# Patient Record
Sex: Male | Born: 1955 | Race: White | Hispanic: No | Marital: Single | Smoking: Current some day smoker
Health system: Southern US, Community
[De-identification: ages and names within clinical notes are randomized; demographics above are authoritative.]

## PROBLEM LIST (undated history)

## (undated) DIAGNOSIS — J189 Pneumonia, unspecified organism: Secondary | ICD-10-CM

## (undated) DIAGNOSIS — M1990 Unspecified osteoarthritis, unspecified site: Secondary | ICD-10-CM

## (undated) DIAGNOSIS — K4020 Bilateral inguinal hernia, without obstruction or gangrene, not specified as recurrent: Secondary | ICD-10-CM

## (undated) DIAGNOSIS — K7030 Alcoholic cirrhosis of liver without ascites: Secondary | ICD-10-CM

## (undated) DIAGNOSIS — E119 Type 2 diabetes mellitus without complications: Secondary | ICD-10-CM

## (undated) DIAGNOSIS — R7303 Prediabetes: Secondary | ICD-10-CM

## (undated) DIAGNOSIS — C439 Malignant melanoma of skin, unspecified: Secondary | ICD-10-CM

## (undated) DIAGNOSIS — I10 Essential (primary) hypertension: Secondary | ICD-10-CM

## (undated) DIAGNOSIS — F419 Anxiety disorder, unspecified: Secondary | ICD-10-CM

## (undated) DIAGNOSIS — B1920 Unspecified viral hepatitis C without hepatic coma: Secondary | ICD-10-CM

## (undated) DIAGNOSIS — C229 Malignant neoplasm of liver, not specified as primary or secondary: Secondary | ICD-10-CM

## (undated) DIAGNOSIS — R21 Rash and other nonspecific skin eruption: Secondary | ICD-10-CM

## (undated) DIAGNOSIS — K649 Unspecified hemorrhoids: Secondary | ICD-10-CM

## (undated) DIAGNOSIS — K635 Polyp of colon: Secondary | ICD-10-CM

## (undated) HISTORY — DX: Polyp of colon: K63.5

## (undated) HISTORY — DX: Malignant neoplasm of liver, not specified as primary or secondary: C22.9

## (undated) HISTORY — DX: Alcoholic cirrhosis of liver without ascites: K70.30

## (undated) HISTORY — DX: Anxiety disorder, unspecified: F41.9

## (undated) HISTORY — DX: Unspecified osteoarthritis, unspecified site: M19.90

## (undated) HISTORY — DX: Unspecified viral hepatitis C without hepatic coma: B19.20

## (undated) HISTORY — DX: Bilateral inguinal hernia, without obstruction or gangrene, not specified as recurrent: K40.20

## (undated) HISTORY — PX: NO PAST SURGERIES: SHX2092

## (undated) HISTORY — DX: Unspecified hemorrhoids: K64.9

## (undated) HISTORY — DX: Malignant melanoma of skin, unspecified: C43.9

---

## 2010-10-21 ENCOUNTER — Emergency Department (HOSPITAL_COMMUNITY)
Admission: EM | Admit: 2010-10-21 | Discharge: 2010-10-21 | Disposition: A | Discharge status: Home / Self Care | Payer: Self-pay | Attending: Emergency Medicine | Admitting: Emergency Medicine

## 2010-10-21 DIAGNOSIS — B029 Zoster without complications: Secondary | ICD-10-CM | POA: Insufficient documentation

## 2010-10-21 DIAGNOSIS — I10 Essential (primary) hypertension: Secondary | ICD-10-CM | POA: Insufficient documentation

## 2010-10-21 DIAGNOSIS — R21 Rash and other nonspecific skin eruption: Secondary | ICD-10-CM | POA: Insufficient documentation

## 2011-10-23 DIAGNOSIS — J189 Pneumonia, unspecified organism: Secondary | ICD-10-CM

## 2011-10-23 HISTORY — DX: Pneumonia, unspecified organism: J18.9

## 2011-10-25 ENCOUNTER — Emergency Department (HOSPITAL_COMMUNITY): Payer: Self-pay

## 2011-10-25 ENCOUNTER — Encounter (HOSPITAL_COMMUNITY): Payer: Self-pay | Admitting: Emergency Medicine

## 2011-10-25 ENCOUNTER — Emergency Department (HOSPITAL_COMMUNITY)
Admission: EM | Admit: 2011-10-25 | Discharge: 2011-10-25 | Disposition: A | Discharge status: Home / Self Care | Payer: Self-pay | Attending: Emergency Medicine | Admitting: Emergency Medicine

## 2011-10-25 DIAGNOSIS — J189 Pneumonia, unspecified organism: Secondary | ICD-10-CM | POA: Insufficient documentation

## 2011-10-25 DIAGNOSIS — R059 Cough, unspecified: Secondary | ICD-10-CM | POA: Insufficient documentation

## 2011-10-25 DIAGNOSIS — R05 Cough: Secondary | ICD-10-CM | POA: Insufficient documentation

## 2011-10-25 DIAGNOSIS — R61 Generalized hyperhidrosis: Secondary | ICD-10-CM | POA: Insufficient documentation

## 2011-10-25 DIAGNOSIS — R5381 Other malaise: Secondary | ICD-10-CM | POA: Insufficient documentation

## 2011-10-25 DIAGNOSIS — R109 Unspecified abdominal pain: Secondary | ICD-10-CM | POA: Insufficient documentation

## 2011-10-25 DIAGNOSIS — J3489 Other specified disorders of nose and nasal sinuses: Secondary | ICD-10-CM | POA: Insufficient documentation

## 2011-10-25 DIAGNOSIS — I10 Essential (primary) hypertension: Secondary | ICD-10-CM | POA: Insufficient documentation

## 2011-10-25 DIAGNOSIS — Z79899 Other long term (current) drug therapy: Secondary | ICD-10-CM | POA: Insufficient documentation

## 2011-10-25 DIAGNOSIS — R6883 Chills (without fever): Secondary | ICD-10-CM | POA: Insufficient documentation

## 2011-10-25 HISTORY — DX: Essential (primary) hypertension: I10

## 2011-10-25 LAB — CBC
HCT: 36.4 % — ABNORMAL LOW (ref 39.0–52.0)
MCHC: 36.5 g/dL — ABNORMAL HIGH (ref 30.0–36.0)
RDW: 12 % (ref 11.5–15.5)

## 2011-10-25 LAB — URINALYSIS, ROUTINE W REFLEX MICROSCOPIC
Glucose, UA: NEGATIVE mg/dL
Hgb urine dipstick: NEGATIVE
Specific Gravity, Urine: 1.026 (ref 1.005–1.030)

## 2011-10-25 LAB — COMPREHENSIVE METABOLIC PANEL
AST: 62 U/L — ABNORMAL HIGH (ref 0–37)
Albumin: 2.4 g/dL — ABNORMAL LOW (ref 3.5–5.2)
Calcium: 8.9 mg/dL (ref 8.4–10.5)
Creatinine, Ser: 0.85 mg/dL (ref 0.50–1.35)
Total Protein: 7.2 g/dL (ref 6.0–8.3)

## 2011-10-25 LAB — DIFFERENTIAL
Basophils Absolute: 0 10*3/uL (ref 0.0–0.1)
Basophils Relative: 0 % (ref 0–1)
Eosinophils Relative: 0 % (ref 0–5)
Monocytes Absolute: 0.8 10*3/uL (ref 0.1–1.0)
Neutro Abs: 12.2 10*3/uL — ABNORMAL HIGH (ref 1.7–7.7)

## 2011-10-25 LAB — URINE MICROSCOPIC-ADD ON

## 2011-10-25 IMAGING — CR DG CHEST 2V
2 series · 2 of 2 positions shown · non-contrast
Comparison: [DATE]

CLINICAL DATA: Cough, chest pain

CHEST - 2 VIEW

[w chest pa]
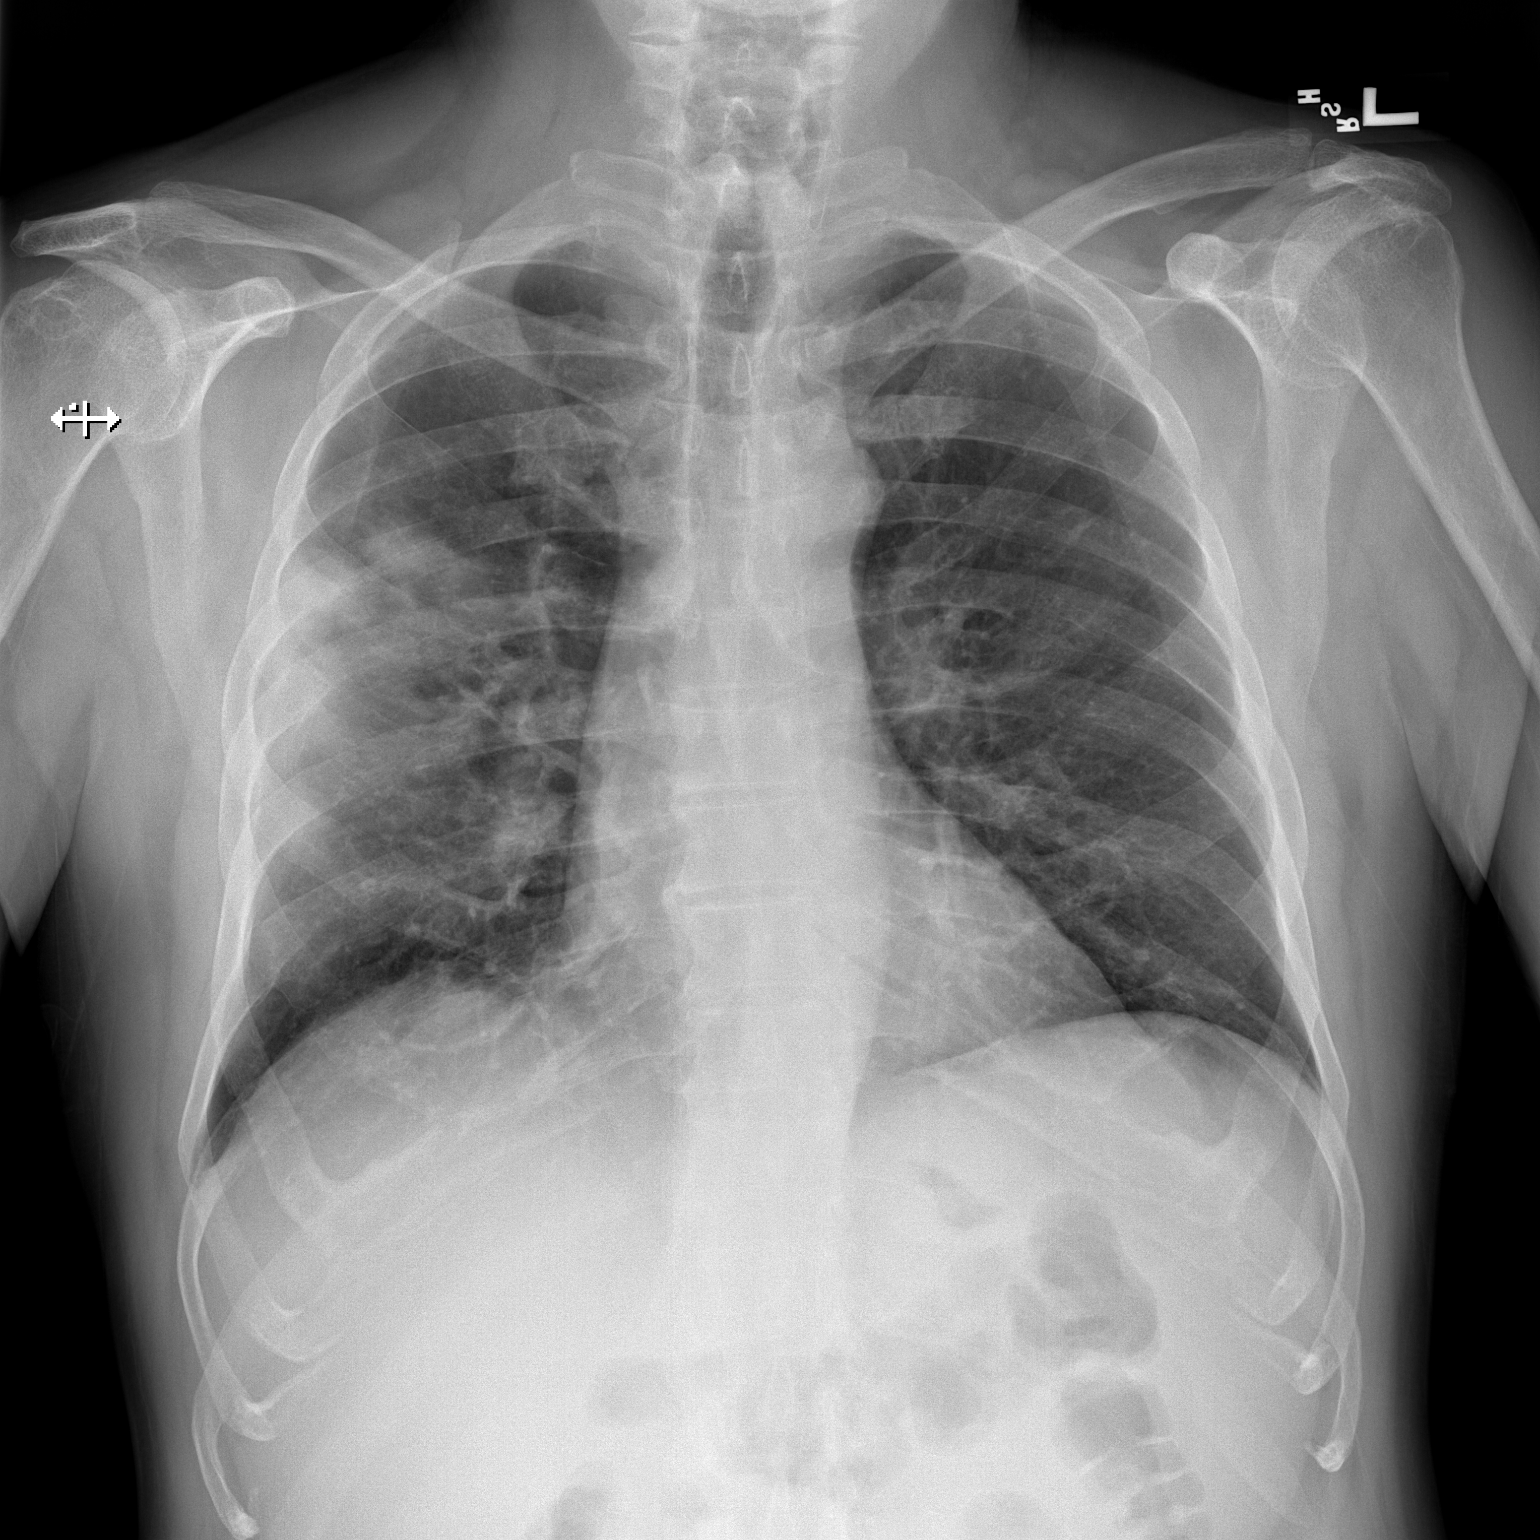

[w chest lat]
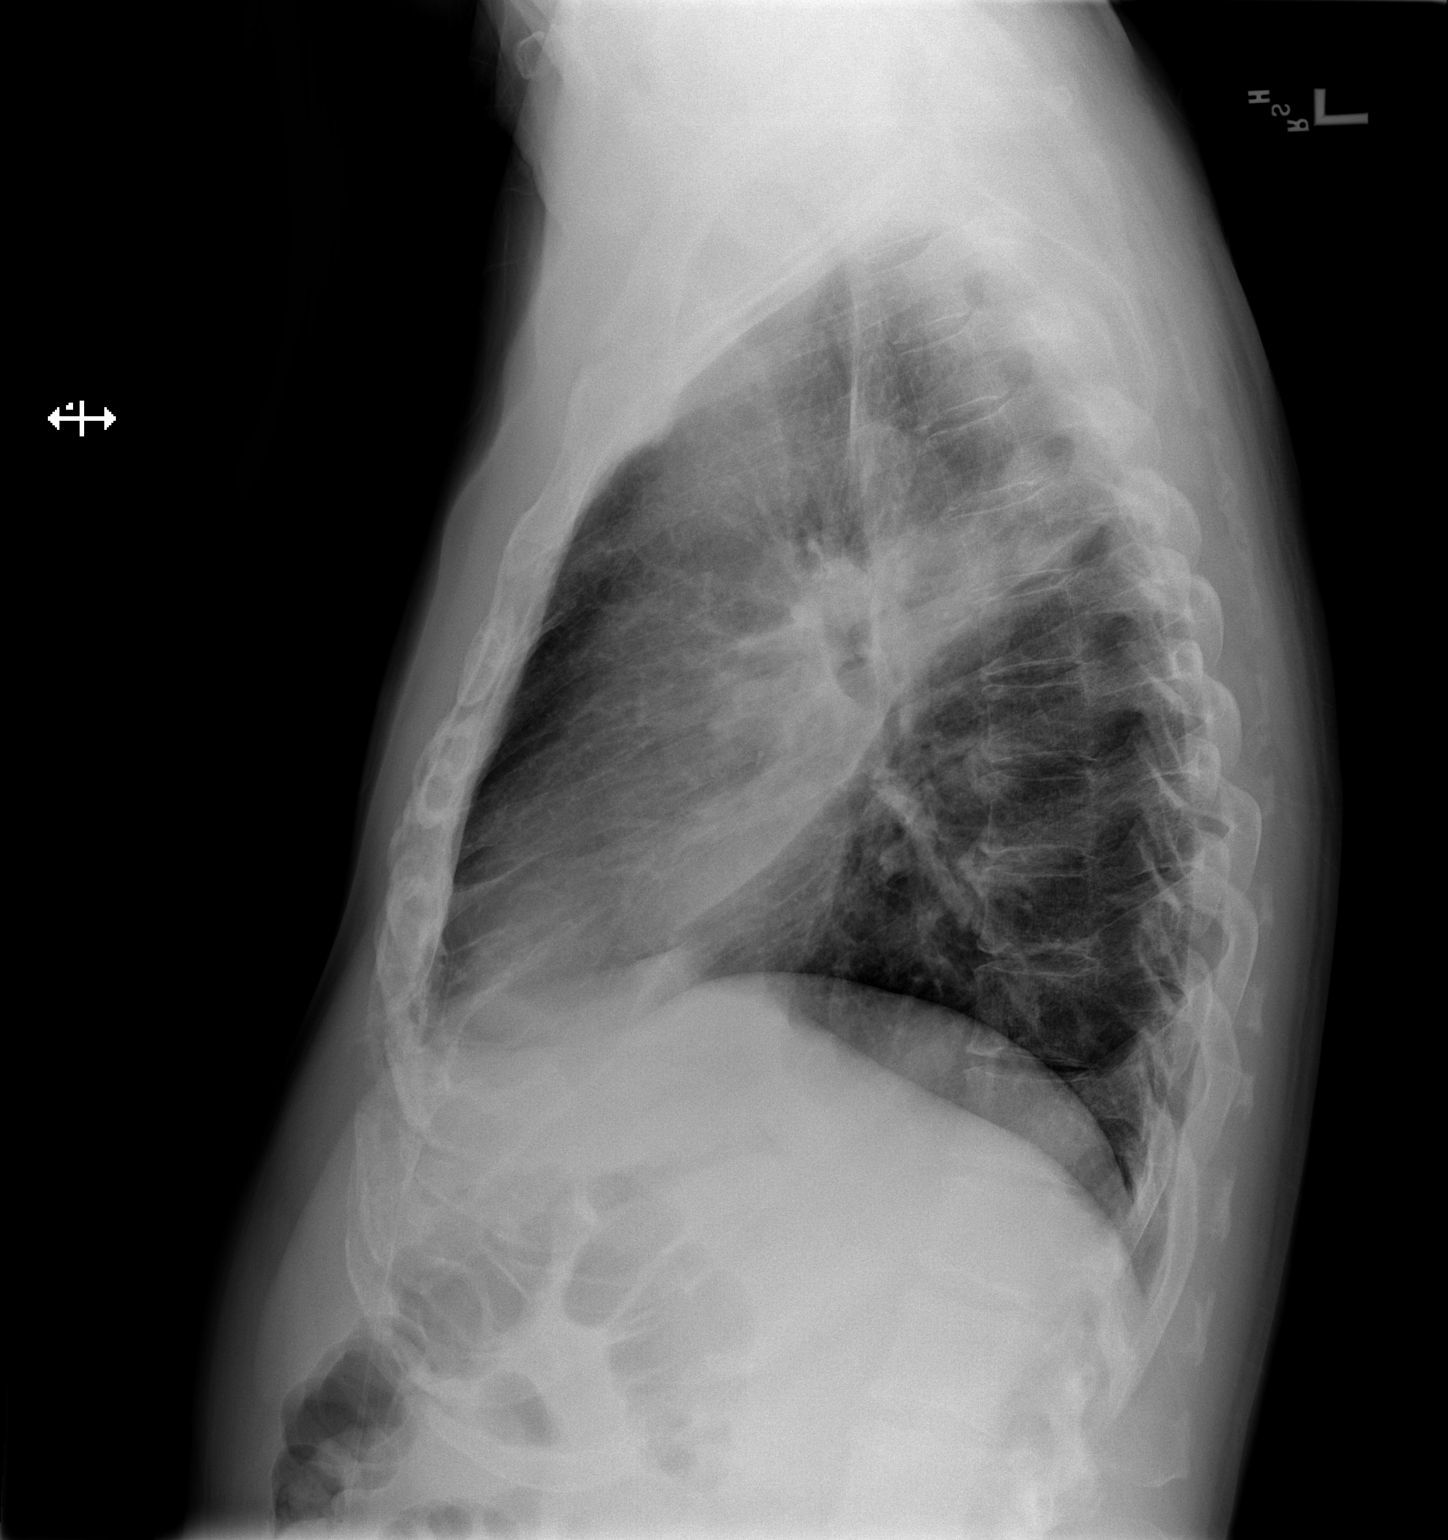

[2 of 2 positions shown; findings below may reference images not displayed]

FINDINGS: Cardiomediastinal silhouette is stable.  There is patchy
airspace disease in the right upper lobe perihilar highly
suspicious for infiltrate/pneumonia.  Follow-up to resolution after
appropriate treatment is recommended.  Bony thorax is stable.
IMPRESSION: Patchy airspace disease right upper lobe perihilar highly
suspicious for infiltrate/pneumonia.  Follow-up to resolution after
appropriate treatment is recommended.

## 2011-10-25 MED ORDER — DEXTROSE 5 % IV SOLN
500.0000 mg | Freq: Once | INTRAVENOUS | Status: AC
  Administered 2011-10-25: 500 mg via INTRAVENOUS
  Filled 2011-10-25: qty 500

## 2011-10-25 MED ORDER — AZITHROMYCIN 250 MG PO TABS: 250.0000 mg | ORAL_TABLET | Freq: Every day | ORAL | Status: DC

## 2011-10-25 MED ORDER — ALBUTEROL SULFATE HFA 108 (90 BASE) MCG/ACT IN AERS
2.0000 | INHALATION_SPRAY | RESPIRATORY_TRACT | Status: DC | PRN
  Administered 2011-10-25: 2 via RESPIRATORY_TRACT
  Filled 2011-10-25: qty 6.7

## 2011-10-25 MED ORDER — SODIUM CHLORIDE 0.9 % IV BOLUS (SEPSIS)
1000.0000 mL | Freq: Once | INTRAVENOUS | Status: AC
  Administered 2011-10-25: 1000 mL via INTRAVENOUS

## 2011-10-25 NOTE — ED Provider Notes (Signed)
History   This chart was scribed for Nelia Shi, MD by Brooks Sailors. The patient was seen in room STRE4/STRE4. Patient's care was started at 1102.   CSN: [id number]  Arrival date & time 10/25/11  1102   First MD Initiated Contact with Patient 10/25/11 1140      Chief Complaint  Patient presents with  . Fatigue  . Influenza    (Consider location/radiation/quality/duration/timing/severity/associated sxs/prior treatment) HPI  Levi Conner is a 56 y.o. male who presents to the Emergency Department complaining of fatigue and low blood pressure onset about three weeks ago. Patient says has been fighting allergies and virus-like symptoms for three weeks with associated cough, congestion, abdominal soreness, night sweats and chills. Patient denies history of prostate problems but says he occasionally has to urinate at night and has had decreased sleep. Patient has been taking Musonex at home with minimal relief. Denies vomiting and diarrhea.    Past Medical History  Diagnosis Date  . Hypertension     History reviewed. No pertinent past surgical history.  History reviewed. No pertinent family history.  History  Substance Use Topics  . Smoking status: Current Everyday Smoker  . Smokeless tobacco: Not on file  . Alcohol Use: No      Review of Systems  HENT: Positive for congestion.   Respiratory: Positive for cough.   Gastrointestinal: Negative for vomiting and diarrhea.  All other systems reviewed and are negative.    Allergies  Penicillins and Sulfa antibiotics  Home Medications   Current Outpatient Rx  Name Route Sig Dispense Refill  . ACETAMINOPHEN 500 MG PO TABS Oral Take 1,000 mg by mouth every 6 (six) hours as needed. For pain    . OMEGA-3 FATTY ACIDS 1000 MG PO CAPS Oral Take 1 g by mouth daily.    . ADULT MULTIVITAMIN W/MINERALS CH Oral Take 1 tablet by mouth daily.      BP 115/73  Pulse 82  Temp(Src) 98.6 F (37 C) (Oral)  Resp 19  SpO2  98%  Physical Exam  Nursing note and vitals reviewed. Constitutional: He is oriented to person, place, and time. He appears well-developed and well-nourished. No distress.  HENT:  Head: Normocephalic and atraumatic.  Eyes: EOM are normal.  Neck: Neck supple. No tracheal deviation present.  Cardiovascular: Normal rate.   Pulmonary/Chest: Effort normal. No respiratory distress.       Scattered rhonchi bilaterally.   Musculoskeletal: Normal range of motion.  Neurological: He is alert and oriented to person, place, and time.  Skin: Skin is warm and dry.  Psychiatric: He has a normal mood and affect. His behavior is normal.    ED Course  Procedures (including critical care time) DIAGNOSTIC STUDIES: Oxygen Saturation is 95% on room air, adequate by my interpretation.    COORDINATION OF CARE: 12:02PM Patient informed of current plan for treatment and evaluation and agrees with plan at this time.     Labs Reviewed  CBC - Abnormal; Notable for the following:    WBC 15.1 (*)    RBC 3.81 (*)    HCT 36.4 (*)    MCH 34.9 (*)    MCHC 36.5 (*)    All other components within normal limits  DIFFERENTIAL - Abnormal; Notable for the following:    Neutrophils Relative 81 (*)    Neutro Abs 12.2 (*)    All other components within normal limits  COMPREHENSIVE METABOLIC PANEL - Abnormal; Notable for the following:    Sodium  126 (*)    Chloride 92 (*)    Glucose, Bld 151 (*)    Albumin 2.4 (*)    AST 62 (*) HEMOLYSIS AT THIS LEVEL MAY AFFECT RESULT   Alkaline Phosphatase 148 (*)    All other components within normal limits  URINALYSIS, ROUTINE W REFLEX MICROSCOPIC - Abnormal; Notable for the following:    Color, Urine AMBER (*) BIOCHEMICALS MAY BE AFFECTED BY COLOR   APPearance CLOUDY (*)    Bilirubin Urine SMALL (*)    Ketones, ur 15 (*)    Leukocytes, UA TRACE (*)    All other components within normal limits  URINE MICROSCOPIC-ADD ON   Dg Chest 2 View  10/25/2011  *RADIOLOGY REPORT*   Clinical Data: Cough, chest pain  CHEST - 2 VIEW  Comparison: 10/17/2004  Findings: Cardiomediastinal silhouette is stable.  There is patchy airspace disease in the right upper lobe perihilar highly suspicious for infiltrate/pneumonia.  Follow-up to resolution after appropriate treatment is recommended.  Bony thorax is stable.  IMPRESSION: Patchy airspace disease right upper lobe perihilar highly suspicious for infiltrate/pneumonia.  Follow-up to resolution after appropriate treatment is recommended.  Original Report Authenticated By: Natasha Mead, M.D.      1. CAP  MDM  Patient stable, VSS, nontoxic with evidence of PNA on CXR. Feel outpatient treatment appropriate.      I personally performed the services described in this documentation, which was scribed in my presence. The recorded information has been reviewed and considered.     Nelia Shi, MD 10/25/11 [phone]

## 2011-10-25 NOTE — ED Notes (Signed)
Pt c/o fatigue and chills with body aches worse at night for 3 weeks; pt sts some allergies to pollen also

## 2011-10-25 NOTE — Discharge Instructions (Signed)
FOLLOW UP WITH YOUR DOCTOR FOR RECHECK NEXT WEEK. TAKE MEDICATIONS AS PRESCRIBED. RETURN HERE WITH ANY HIGH FEVER, SEVERE PAIN OR NEW CONCERN.  Pneumonia, Adult Pneumonia is an infection of the lungs.  CAUSES Pneumonia may be caused by bacteria or a virus. Usually, these infections are caused by breathing infectious particles into the lungs (respiratory tract). SYMPTOMS   Cough.   Fever.   Chest pain.   Increased rate of breathing.   Wheezing.   Mucus production.  DIAGNOSIS  If you have the common symptoms of pneumonia, your caregiver will typically confirm the diagnosis with a chest X-ray. The X-ray will show an abnormality in the lung (pulmonary infiltrate) if you have pneumonia. Other tests of your blood, urine, or sputum may be done to find the specific cause of your pneumonia. Your caregiver may also do tests (blood gases or pulse oximetry) to see how well your lungs are working. TREATMENT  Some forms of pneumonia may be spread to other people when you cough or sneeze. You may be asked to wear a mask before and during your exam. Pneumonia that is caused by bacteria is treated with antibiotic medicine. Pneumonia that is caused by the influenza virus may be treated with an antiviral medicine. Most other viral infections must run their course. These infections will not respond to antibiotics.  PREVENTION A pneumococcal shot (vaccine) is available to prevent a common bacterial cause of pneumonia. This is usually suggested for:  People over 13 years old.   Patients on chemotherapy.   People with chronic lung problems, such as bronchitis or emphysema.   People with immune system problems.  If you are over 65 or have a high risk condition, you may receive the pneumococcal vaccine if you have not received it before. In some countries, a routine influenza vaccine is also recommended. This vaccine can help prevent some cases of pneumonia.You may be offered the influenza vaccine as part  of your care. If you smoke, it is time to quit. You may receive instructions on how to stop smoking. Your caregiver can provide medicines and counseling to help you quit. HOME CARE INSTRUCTIONS   Cough suppressants may be used if you are losing too much rest. However, coughing protects you by clearing your lungs. You should avoid using cough suppressants if you can.   Your caregiver may have prescribed medicine if he or she thinks your pneumonia is caused by a bacteria or influenza. Finish your medicine even if you start to feel better.   Your caregiver may also prescribe an expectorant. This loosens the mucus to be coughed up.   Only take over-the-counter or prescription medicines for pain, discomfort, or fever as directed by your caregiver.   Do not smoke. Smoking is a common cause of bronchitis and can contribute to pneumonia. If you are a smoker and continue to smoke, your cough may last several weeks after your pneumonia has cleared.   A cold steam vaporizer or humidifier in your room or home may help loosen mucus.   Coughing is often worse at night. Sleeping in a semi-upright position in a recliner or using a couple pillows under your head will help with this.   Get rest as you feel it is needed. Your body will usually let you know when you need to rest.  SEEK IMMEDIATE MEDICAL CARE IF:   Your illness becomes worse. This is especially true if you are elderly or weakened from any other disease.   You cannot control  your cough with suppressants and are losing sleep.   You begin coughing up blood.   You develop pain which is getting worse or is uncontrolled with medicines.   You have a fever.   Any of the symptoms which initially brought you in for treatment are getting worse rather than better.   You develop shortness of breath or chest pain.  MAKE SURE YOU:   Understand these instructions.   Will watch your condition.   Will get help right away if you are not doing well  or get worse.  Document Released: 06/10/2005 Document Revised: 05/30/2011 Document Reviewed: 08/30/2010 [facility] Patient Information 2012 [place], [place].

## 2011-10-25 NOTE — ED Notes (Signed)
Pt c/o lt lower rib pain for 5-6 weeks .  He has been coughing for the same amount of time.  He has also had an intermittent temp

## 2011-10-25 NOTE — ED Notes (Signed)
Ordered regular diet tray

## 2011-10-25 NOTE — ED Notes (Signed)
Antibiotic infusing as specified.  The pt had a meal tray ordered pt did not eat very much.  No complaints.

## 2011-10-30 ENCOUNTER — Encounter (HOSPITAL_COMMUNITY): Payer: Self-pay | Admitting: *Deleted

## 2011-10-30 ENCOUNTER — Emergency Department (HOSPITAL_COMMUNITY): Payer: Self-pay

## 2011-10-30 ENCOUNTER — Inpatient Hospital Stay (HOSPITAL_COMMUNITY)
Admission: EM | Admit: 2011-10-30 | Discharge: 2011-10-31 | DRG: 179 | Disposition: A | Discharge status: Home / Self Care | Payer: Self-pay | Attending: Internal Medicine | Admitting: Internal Medicine

## 2011-10-30 DIAGNOSIS — Z882 Allergy status to sulfonamides status: Secondary | ICD-10-CM

## 2011-10-30 DIAGNOSIS — J852 Abscess of lung without pneumonia: Principal | ICD-10-CM | POA: Diagnosis present

## 2011-10-30 DIAGNOSIS — R042 Hemoptysis: Secondary | ICD-10-CM

## 2011-10-30 DIAGNOSIS — I10 Essential (primary) hypertension: Secondary | ICD-10-CM | POA: Diagnosis present

## 2011-10-30 DIAGNOSIS — J189 Pneumonia, unspecified organism: Secondary | ICD-10-CM

## 2011-10-30 DIAGNOSIS — F17200 Nicotine dependence, unspecified, uncomplicated: Secondary | ICD-10-CM

## 2011-10-30 DIAGNOSIS — R7401 Elevation of levels of liver transaminase levels: Secondary | ICD-10-CM | POA: Diagnosis present

## 2011-10-30 DIAGNOSIS — Z880 Allergy status to penicillin: Secondary | ICD-10-CM

## 2011-10-30 DIAGNOSIS — J159 Unspecified bacterial pneumonia: Secondary | ICD-10-CM

## 2011-10-30 DIAGNOSIS — R7402 Elevation of levels of lactic acid dehydrogenase (LDH): Secondary | ICD-10-CM | POA: Diagnosis present

## 2011-10-30 DIAGNOSIS — Z79899 Other long term (current) drug therapy: Secondary | ICD-10-CM

## 2011-10-30 DIAGNOSIS — R7309 Other abnormal glucose: Secondary | ICD-10-CM | POA: Diagnosis present

## 2011-10-30 HISTORY — DX: Pneumonia, unspecified organism: J18.9

## 2011-10-30 HISTORY — DX: Prediabetes: R73.03

## 2011-10-30 LAB — CBC
HCT: 38.3 % — ABNORMAL LOW (ref 39.0–52.0)
MCH: 33.8 pg (ref 26.0–34.0)
MCHC: 34.5 g/dL (ref 30.0–36.0)
MCV: 97.4 fL (ref 78.0–100.0)
MCV: 98 fL (ref 78.0–100.0)
Platelets: 373 10*3/uL (ref 150–400)
Platelets: 361 10*3/uL (ref 150–400)
RDW: 12.4 % (ref 11.5–15.5)
RDW: 12.4 % (ref 11.5–15.5)
WBC: 8.8 10*3/uL (ref 4.0–10.5)

## 2011-10-30 LAB — D-DIMER, QUANTITATIVE: D-Dimer, Quant: 1.27 ug/mL-FEU — ABNORMAL HIGH (ref 0.00–0.48)

## 2011-10-30 LAB — DIFFERENTIAL
Basophils Absolute: 0 10*3/uL (ref 0.0–0.1)
Basophils Relative: 0 % (ref 0–1)
Eosinophils Relative: 3 % (ref 0–5)
Lymphocytes Relative: 26 % (ref 12–46)
Neutro Abs: 5.7 10*3/uL (ref 1.7–7.7)

## 2011-10-30 LAB — POCT I-STAT, CHEM 8
BUN: 7 mg/dL (ref 6–23)
HCT: 41 % (ref 39.0–52.0)
Sodium: 139 mEq/L (ref 135–145)
TCO2: 27 mmol/L (ref 0–100)

## 2011-10-30 LAB — RAPID URINE DRUG SCREEN, HOSP PERFORMED: Opiates: NOT DETECTED

## 2011-10-30 IMAGING — CT CT ANGIO CHEST
1 series · 1 of 1 positions shown · IV contrast (omnipaque)
Comparison: [DATE] chest radiograph.

CLINICAL DATA: Pneumonia at the right lung.  The patient is
completed antibiotics.  Cough.  Chest pain.

CT ANGIOGRAPHY CHEST
TECHNIQUE: Multidetector CT imaging of the chest using the
standard protocol during bolus administration of intravenous
contrast. Multiplanar reconstructed images including MIPs were
obtained and reviewed to evaluate the vascular anatomy.
Contrast: 100mL OMNIPAQUE IOHEXOL 350 MG/ML SOLN

[Series 1: topogram 0.6 t20s · coronal · 0.6mm · 1.00mm/px · 1 of 1 slices shown]
[im 1/1]
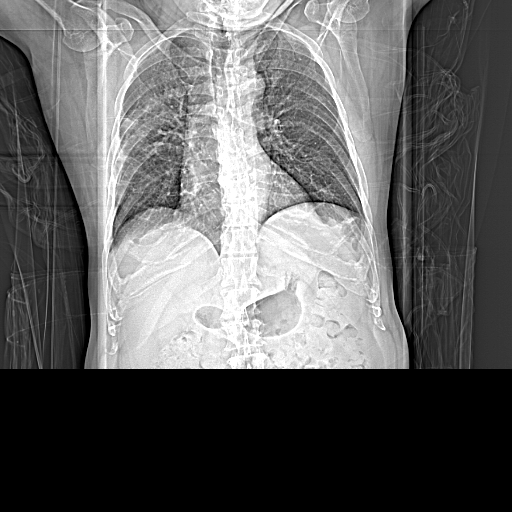

[1 of 1 positions shown; findings below may reference images not displayed]

FINDINGS: Inferior right upper lobe consolidation is present.
There are areas of cystic change within the consolidation,
suggesting necrotizing pneumonia.  This likely accounts for the
patient's hemoptysis.

The study is technically adequate for evaluation of pulmonary
embolus.  There is no pulmonary embolism present.  The heart
grossly appears within normal limits.  Mild aortic arch
atherosclerosis.  No aggressive osseous lesions are identified.  No
pericardial effusion.  No pleural effusion.  Incidental imaging of
the upper abdomen is unremarkable.  Small hiatal hernia.  Mild
distal esophageal thickening suggest reflux disease.  No axillary
adenopathy.  No mediastinal or hilar adenopathy.  No aggressive
osseous lesions.  Mild thoracic spine DISH.

Follow-up to ensure radiographic clearing recommended.  Clearing is
usually observed at 8 weeks.
IMPRESSION: 1.  Technically adequate study without pulmonary embolus.
2.  Inferior right upper lobe consolidation respecting the major
fissure, most compatible with pneumonia.  Small cystic areas within
the consolidation are compatible with necrotizing pneumonia.  No
empyema.

Findings discussed with [REDACTED] at [3S] hours on [DATE].

## 2011-10-30 IMAGING — CT CT ANGIO CHEST
2 of 6 series · 19 of 46 positions shown · IV contrast (APPLIED)
Comparison: [DATE] chest radiograph.

CLINICAL DATA: Pneumonia at the right lung.  The patient is
completed antibiotics.  Cough.  Chest pain.

CT ANGIOGRAPHY CHEST
TECHNIQUE: Multidetector CT imaging of the chest using the
standard protocol during bolus administration of intravenous
contrast. Multiplanar reconstructed images including MIPs were
obtained and reviewed to evaluate the vascular anatomy.
Contrast: 100mL OMNIPAQUE IOHEXOL 350 MG/ML SOLN

[Series 6: pulm embolism 1.0 b25f thin · axial · 0.70mm/px · z∈[-272,-22]mm · 16 of 273 slices shown]
[im 12/273  lung]
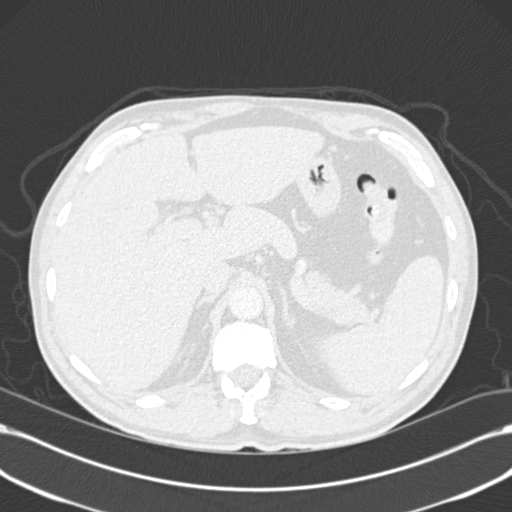
[im 36/273  soft-tissue]
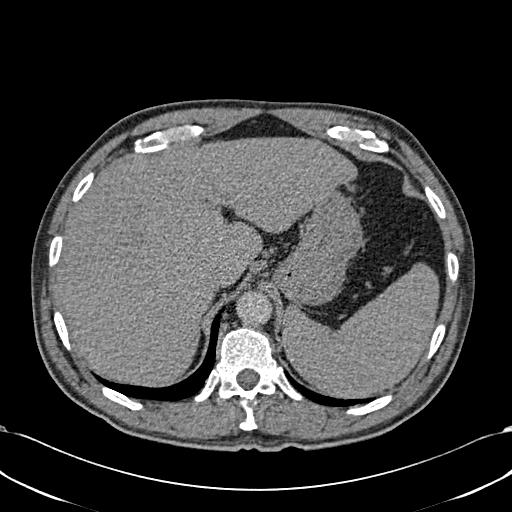
[im 48/273  lung]
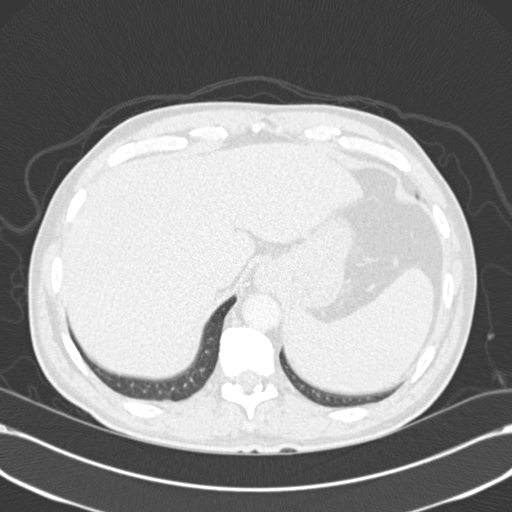
[im 60/273  soft-tissue]
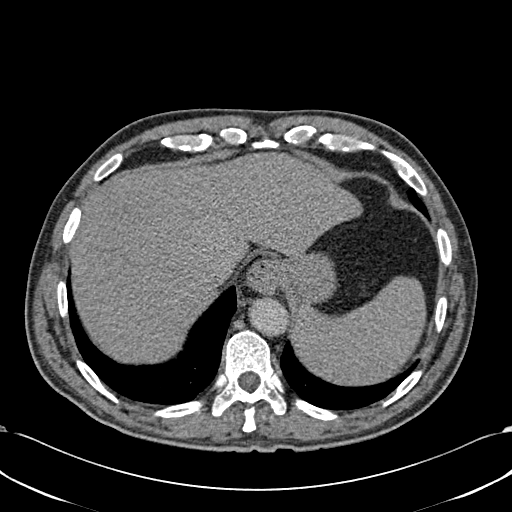
[im 83/273  lung]
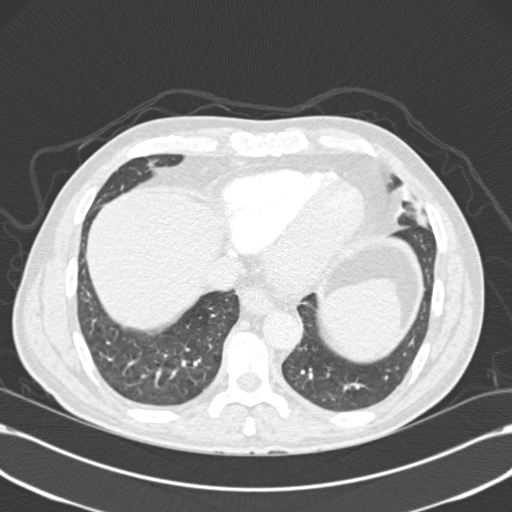
[im 95/273  soft-tissue]
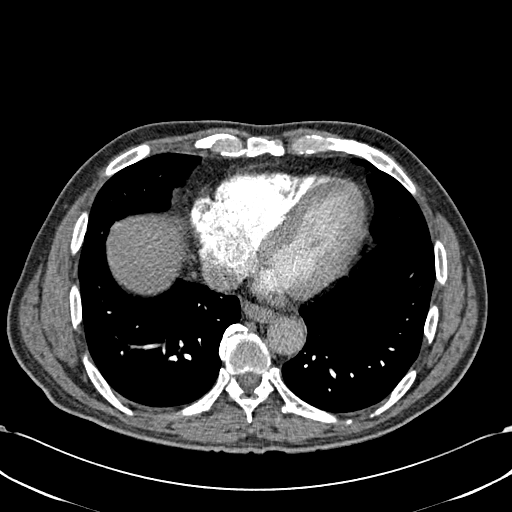
[im 107/273  lung]
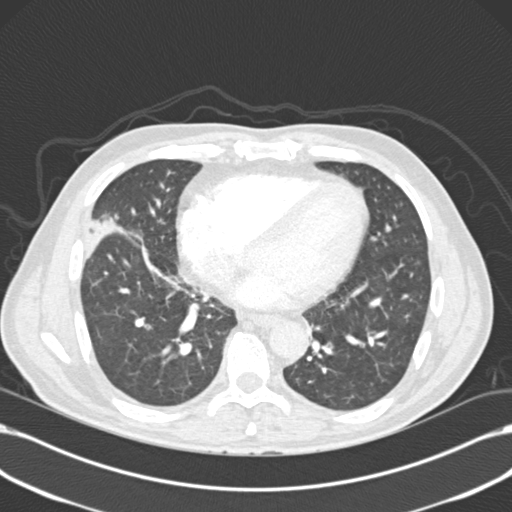
[im 131/273  soft-tissue]
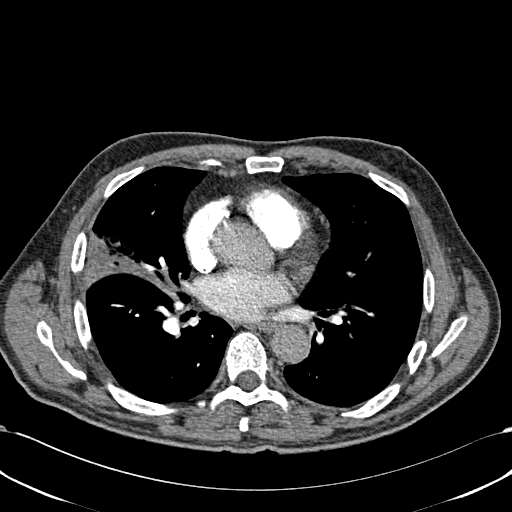
[im 142/273  lung]
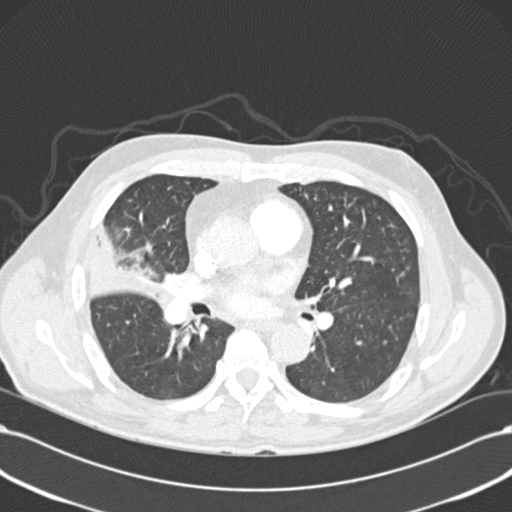
[im 166/273  soft-tissue]
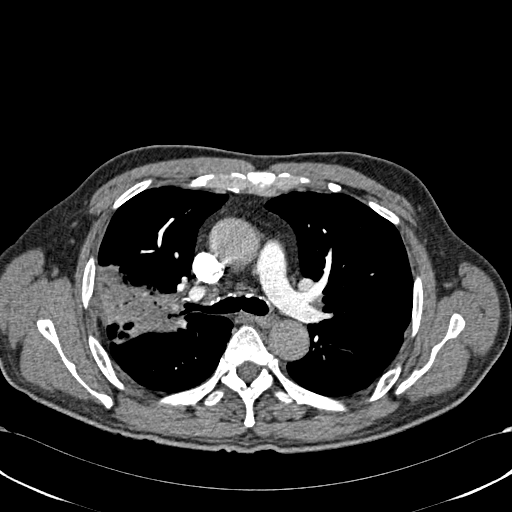
[im 178/273  lung]
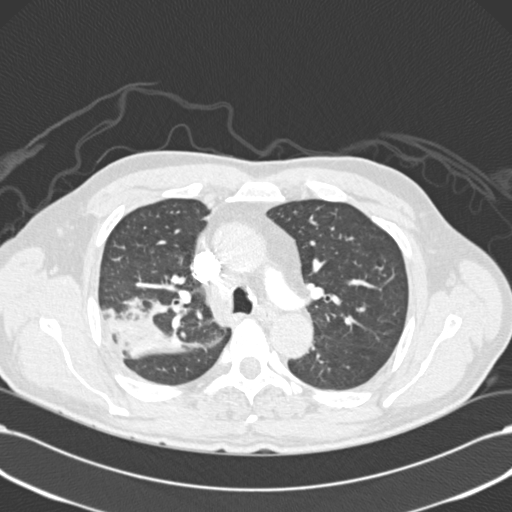
[im 190/273  soft-tissue]
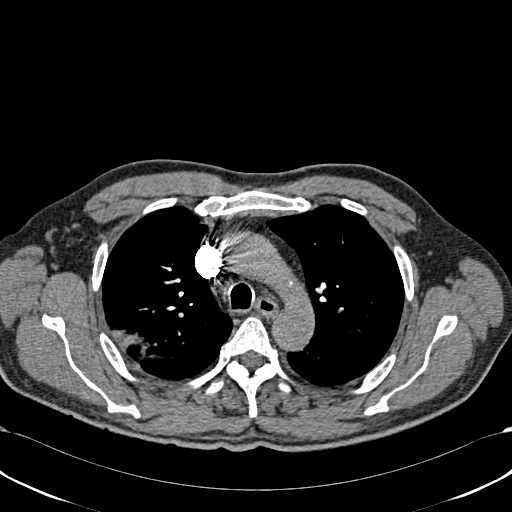
[im 213/273  lung]
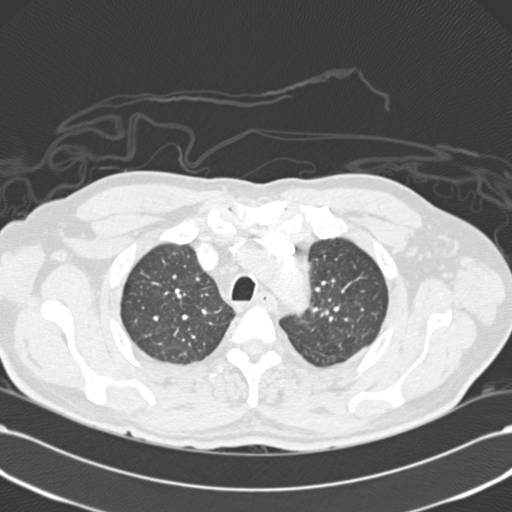
[im 225/273  soft-tissue]
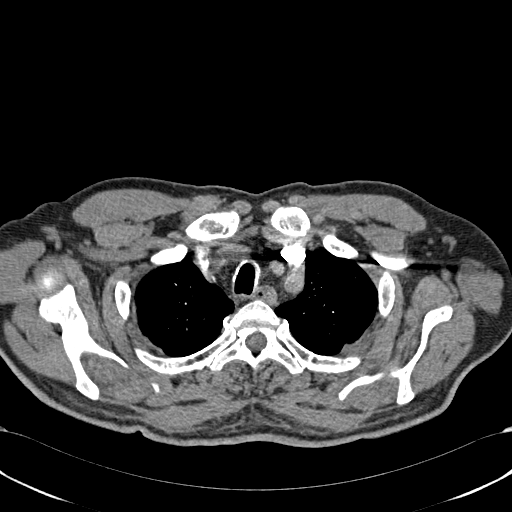
[im 237/273  lung]
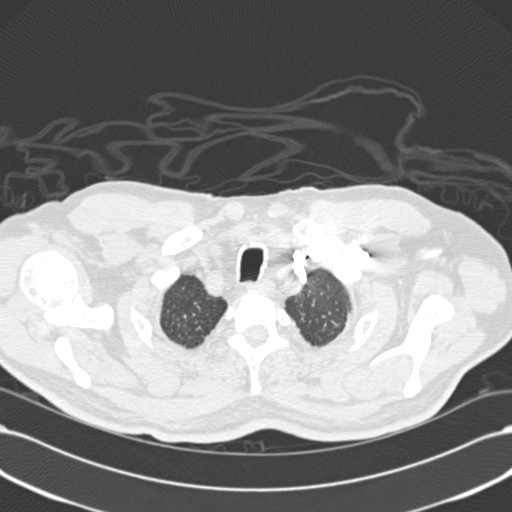
[im 261/273  soft-tissue]
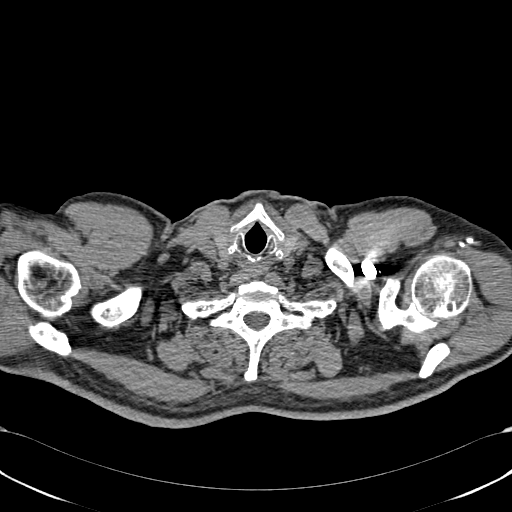

[Series 602: coronal mpr · coronal · 0.70mm/px · 3 of 72 slices shown]
[im 18/72  soft-tissue]
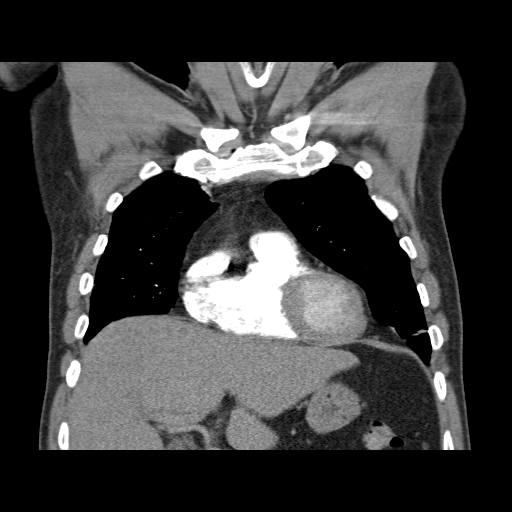
[im 36/72  soft-tissue]
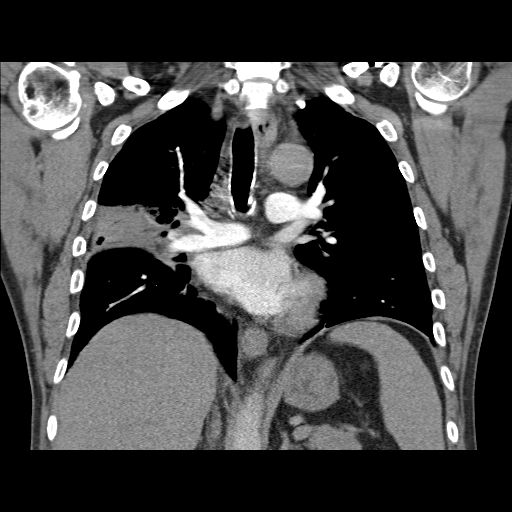
[im 54/72  soft-tissue]
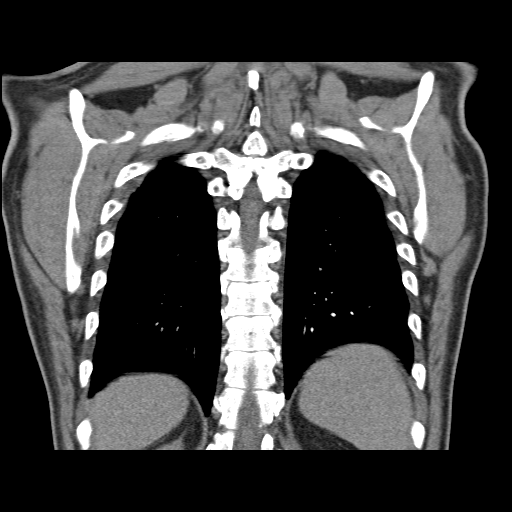

[19 of 46 positions shown; findings below may reference images not displayed]

FINDINGS: Inferior right upper lobe consolidation is present.
There are areas of cystic change within the consolidation,
suggesting necrotizing pneumonia.  This likely accounts for the
patient's hemoptysis.

The study is technically adequate for evaluation of pulmonary
embolus.  There is no pulmonary embolism present.  The heart
grossly appears within normal limits.  Mild aortic arch
atherosclerosis.  No aggressive osseous lesions are identified.  No
pericardial effusion.  No pleural effusion.  Incidental imaging of
the upper abdomen is unremarkable.  Small hiatal hernia.  Mild
distal esophageal thickening suggest reflux disease.  No axillary
adenopathy.  No mediastinal or hilar adenopathy.  No aggressive
osseous lesions.  Mild thoracic spine DISH.

Follow-up to ensure radiographic clearing recommended.  Clearing is
usually observed at 8 weeks.
IMPRESSION: 1.  Technically adequate study without pulmonary embolus.
2.  Inferior right upper lobe consolidation respecting the major
fissure, most compatible with pneumonia.  Small cystic areas within
the consolidation are compatible with necrotizing pneumonia.  No
empyema.

Findings discussed with [REDACTED] at [3S] hours on [DATE].

## 2011-10-30 MED ORDER — ACETAMINOPHEN 650 MG RE SUPP: 650.0000 mg | Freq: Four times a day (QID) | RECTAL | Status: DC | PRN

## 2011-10-30 MED ORDER — SODIUM CHLORIDE 0.9 % IV SOLN
INTRAVENOUS | Status: DC
  Administered 2011-10-30 – 2011-10-31 (×2): via INTRAVENOUS

## 2011-10-30 MED ORDER — SODIUM CHLORIDE 0.9 % IV SOLN
INTRAVENOUS | Status: AC
  Administered 2011-10-30: 17:00:00 via INTRAVENOUS

## 2011-10-30 MED ORDER — IOHEXOL 350 MG/ML SOLN
100.0000 mL | Freq: Once | INTRAVENOUS | Status: AC | PRN
  Administered 2011-10-30: 100 mL via INTRAVENOUS

## 2011-10-30 MED ORDER — MORPHINE SULFATE 2 MG/ML IJ SOLN: 1.0000 mg | INTRAMUSCULAR | Status: DC | PRN

## 2011-10-30 MED ORDER — HYDROCODONE-ACETAMINOPHEN 5-325 MG PO TABS: 1.0000 | ORAL_TABLET | ORAL | Status: DC | PRN

## 2011-10-30 MED ORDER — OMEGA-3-ACID ETHYL ESTERS 1 G PO CAPS
1.0000 g | ORAL_CAPSULE | Freq: Every day | ORAL | Status: DC
  Administered 2011-10-31: 1 g via ORAL
  Filled 2011-10-30: qty 1

## 2011-10-30 MED ORDER — ADULT MULTIVITAMIN W/MINERALS CH
1.0000 | ORAL_TABLET | Freq: Every day | ORAL | Status: DC
  Administered 2011-10-31: 1 via ORAL
  Filled 2011-10-30: qty 1

## 2011-10-30 MED ORDER — ALBUTEROL SULFATE (5 MG/ML) 0.5% IN NEBU: 2.5000 mg | INHALATION_SOLUTION | RESPIRATORY_TRACT | Status: DC | PRN

## 2011-10-30 MED ORDER — LEVOFLOXACIN 250 MG PO TABS: 750.0000 mg | ORAL_TABLET | Freq: Every day | ORAL | Status: DC

## 2011-10-30 MED ORDER — ACETAMINOPHEN 325 MG PO TABS: 650.0000 mg | ORAL_TABLET | Freq: Four times a day (QID) | ORAL | Status: DC | PRN

## 2011-10-30 MED ORDER — OMEGA-3 FATTY ACIDS 1000 MG PO CAPS: 1.0000 g | ORAL_CAPSULE | Freq: Every day | ORAL | Status: DC

## 2011-10-30 MED ORDER — LEVOFLOXACIN 750 MG PO TABS
750.0000 mg | ORAL_TABLET | Freq: Every day | ORAL | Status: DC
  Administered 2011-10-30 – 2011-10-31 (×2): 750 mg via ORAL
  Filled 2011-10-30 (×2): qty 1

## 2011-10-30 MED ORDER — CLINDAMYCIN PHOSPHATE 600 MG/50ML IV SOLN
600.0000 mg | Freq: Three times a day (TID) | INTRAVENOUS | Status: DC
  Administered 2011-10-30 – 2011-10-31 (×4): 600 mg via INTRAVENOUS
  Filled 2011-10-30 (×6): qty 50

## 2011-10-30 NOTE — ED Notes (Signed)
Lab at bedside

## 2011-10-30 NOTE — ED Notes (Signed)
Patient remains in CT.  Waiting on ISTAT per Dr. Clarene Duke before giving dye for test.  Blood has been taken to the lab and awaiting results.

## 2011-10-30 NOTE — ED Notes (Signed)
Pt ambulated with minimal assistance.  Maintained pulse ox of 96% and HR 115-126.  Pt stated he felt no dizziness or shortness of breath.  Pt hooked back on monitor and resting comfortably at this time.  Pt and family informed of updated plan of care.  Will inquiry with PA/RN if patient is able to eat or drink.

## 2011-10-30 NOTE — ED Notes (Signed)
Pt reports being seen here recently for cough, diagnosed with pneumonia, coughed up small amount of blood this am. No resp distress noted at triage.

## 2011-10-30 NOTE — ED Notes (Signed)
Antibiotic ordered from pharmacy.  Tech will ambulate pt with pulse ox.

## 2011-10-30 NOTE — ED Notes (Signed)
Pt sitting in stretcher talking on phone and eating dinner.  Pt aware of wait for admission bed.

## 2011-10-30 NOTE — ED Notes (Signed)
Care transferred and report given to Lyn, RN.  

## 2011-10-30 NOTE — ED Notes (Signed)
Called to give report, RN unable to answer at this time.

## 2011-10-30 NOTE — H&P (Signed)
[facility] History and Physical  ADISA LITT WUJ:[id number] DOB: 1956/02/24 DOA: 10/30/2011  PCP: Geraldo Pitter, MD, MD   Chief Complaint: Coughing blood.  HPI:  56 year old with PMH significant for hypertension, borderline Diabetes, who presents to ED complaining of hemoptysis. He relates 1 months history of productive cough, clear sputum, chills, night sweat, shortness of breath. He denies weight loss. He was seen in the ED 5 days ago diagnosed with PNA and treated with Z - Pack. He presented again with persistence of symptoms and hemoptysis. He denies tooth pain.   Review of Systems:   HEENT:  No headaches, Difficulty swallowing,Tooth/dental problems,Sore throat,  No sneezing, itching, ear ache, nasal congestion, post nasal drip,  Cardio-vascular:  No chest pain, Orthopnea, PND, swelling in lower extremities, anasarca, dizziness, palpitations  GI:  No heartburn, indigestion, abdominal pain, nausea, vomiting, diarrhea, change in bowel habits, loss of appetite  Resp:  .No change in color of mucus.No wheezing.No chest wall deformity  Skin:  no rash or lesions.  GU:  no dysuria, change in color of urine, no urgency or frequency. No flank pain.  Musculoskeletal:  No joint pain or swelling. No decreased range of motion. No back pain.  Psych:  No change in mood or affect. No depression or anxiety. No memory loss.    Past Medical History  Diagnosis Date  . Hypertension   . Pneumonia    History reviewed. No pertinent past surgical history. Social History:  reports that he has been smoking Cigarettes.  He has a 20 pack-year smoking history. He does not have any smokeless tobacco history on file. He reports that he does not drink alcohol or use illicit drugs.  Allergies  Allergen Reactions  . Penicillins Other (See Comments)    unknown  . Sulfa Antibiotics Other (See Comments)    unknown    History reviewed. No pertinent family history.  Prior to  Admission medications   Medication Sig Start Date End Date Taking? Authorizing Provider  acetaminophen (TYLENOL) 500 MG tablet Take 1,000 mg by mouth every 6 (six) hours as needed. For pain   Yes Historical Provider, MD  fish oil-omega-3 fatty acids 1000 MG capsule Take 1 g by mouth daily.   Yes Historical Provider, MD  Multiple Vitamin (MULITIVITAMIN WITH MINERALS) TABS Take 1 tablet by mouth daily.   Yes Historical Provider, MD  levofloxacin (LEVAQUIN) 250 MG tablet Take 3 tablets (750 mg total) by mouth daily. 10/30/11 11/09/11  Fayrene Helper, PA-C   Physical Exam: Filed Vitals:   10/30/11 1121 10/30/11 1539 10/30/11 1643  BP: 128/87 117/79 115/82  Pulse: 105 88 91  Temp: 98 F (36.7 C) 98.4 F (36.9 C)   TempSrc: Oral Oral   Resp: 18 18 18   SpO2: 98% 97% 99%     General:  Chronic Ill appearing, in not acute distress. Speaking full sentences.  Eyes: PERLA  ENT: No tonsillar enlargement, poor dentition, upper dentition not present.   Neck: No thyromegaly, no lymphadenopathy.  Cardiovascular: S1, S2, RRR, no rubs or gallops.   Respiratory: Respiration unlabored, crackles bases, no wheezes.  Abdomen: Bs present, soft, Non tender, non distended, no rigidity.   Skin: no rash.  Musculoskeletal: no joint deformity or swelling.   Psychiatric: Flat affect.   Neurologic: Alert and oriented times 3, cranial nerve II to XII intact, sensation grossly intact.   Labs on Admission:  Basic Metabolic Panel:  Lab 10/30/11 9562 10/25/11 1203  NA 139 126*  K  4.2 3.9  CL 102 92*  CO2 -- 22  GLUCOSE 121* 151*  BUN 7 15  CREATININE 1.10 0.85  CALCIUM -- 8.9  MG -- --  PHOS -- --   Liver Function Tests:  Lab 10/25/11 1203  AST 62*  ALT 47  ALKPHOS 148*  BILITOT 0.6  PROT 7.2  ALBUMIN 2.4*   CBC:  Lab 10/30/11 1443 10/30/11 1248 10/25/11 1203  WBC -- 8.8 15.1*  NEUTROABS -- 5.7 12.2*  HGB 13.9 14.7 13.3  HCT 41.0 41.1 36.4*  MCV -- 97.4 95.5  PLT -- 373 375     Radiological Exams on Admission: Ct Angio Chest W/cm &/or Wo Cm  10/30/2011  *RADIOLOGY REPORT*  Clinical Data: Pneumonia at the right lung.  The patient is completed antibiotics.  Cough.  Chest pain.  CT ANGIOGRAPHY CHEST  Technique:  Multidetector CT imaging of the chest using the standard protocol during bolus administration of intravenous contrast. Multiplanar reconstructed images including MIPs were obtained and reviewed to evaluate the vascular anatomy.  Contrast: OMNIPAQUE IOHEXOL 350 MG/ML SOLN  Comparison: 10/25/2011 chest radiograph.  Findings: Inferior right upper lobe consolidation is present. There are areas of cystic change within the consolidation, suggesting necrotizing pneumonia.  This likely accounts for the patient's hemoptysis.  The study is technically adequate for evaluation of pulmonary embolus.  There is no pulmonary embolism present.  The heart grossly appears within normal limits.  Mild aortic arch atherosclerosis.  No aggressive osseous lesions are identified.  No pericardial effusion.  No pleural effusion.  Incidental imaging of the upper abdomen is unremarkable.  Small hiatal hernia.  Mild distal esophageal thickening suggest reflux disease.  No axillary adenopathy.  No mediastinal or hilar adenopathy.  No aggressive osseous lesions.  Mild thoracic spine DISH.  Follow-up to ensure radiographic clearing recommended.  Clearing is usually observed at 8 weeks.  IMPRESSION: 1.  Technically adequate study without pulmonary embolus. 2.  Inferior right upper lobe consolidation respecting the major fissure, most compatible with pneumonia.  Small cystic areas within the consolidation are compatible with necrotizing pneumonia.  No empyema.  Findings discussed with PA Laveda Norman at 1557 hours on 10/30/2011.  Original Report Authenticated By: Andreas Newport, M.D.     Assessment/Plan: Patient Active Hospital Problem List:  1- Necrotizing PNA (pneumonia):  Patient present with Chronic  symptoms: cough, chills, hemoptysis, he has poor dentition. He was treated with Z pack 5 days ago. He presents today with hemoptysis. WBC normal probably secondary to partially treated infection. I will start Clindamycin 600 mg IV Q 8 Hour. No risk factors for MRSA. He has 20 year history of smoking, he is at risk for cancer. I will consult pulmonology for evaluation for bronchoscopy and to establish care. I will order HIV, Sputum culture, blood culture.  CT angio negative for Pulmonary Embolism.   2-HTN: Patient stop taking BP medications 3 weeks ago because his BP was low. I will hold BP medication.  3-Pre Diabetes: I will check HB-A1c.   4-DVT Prophylaxis: SCD. Will consider Lovenox/heparin if hemoptysis resolved.  5-Transaminases: I will check Hepatitis panel. He denies alcohol use.     Hartley Barefoot, MD  [facility] Pager [phone]  If 7PM-7AM, please contact night-coverage [email or web address] Password [facility] 10/30/2011, 5:49 PM

## 2011-10-30 NOTE — ED Provider Notes (Signed)
History     CSN: [id number]  Arrival date & time 10/30/11  1117   First MD Initiated Contact with Patient 10/30/11 1226      Chief Complaint  Patient presents with  . Hemoptysis    (Consider location/radiation/quality/duration/timing/severity/associated sxs/prior treatment) HPI  56 year old male presents with complaint of hemoptysis. Patient states for the past month he has been feeling very tired with associated night sweats, cough productive with green sputum, and sore throat. He was seen in the ED 5 days ago. Chest x-ray at that time shows a right upper lobe infiltrate. Patient was diagnosed with community acquired pneumonia and was discharged with Z-Pak. Patient has finished his medication and has noticed some improvement. However since yesterday he has noticed streaks of red blood in his sputum when he coughs. He continued to endorse generalized fatigue. Denies any fever or chills. States nightsweats has improved. Patient denies any recent travel, living in enclosed space such as prison, having recent surgery, or prolonged bed rest. He denies calf pain or leg swelling. He denies any significant weight changes.  Denies recent hospitalization.    Past Medical History  Diagnosis Date  . Hypertension   . Pneumonia     History reviewed. No pertinent past surgical history.  History reviewed. No pertinent family history.  History  Substance Use Topics  . Smoking status: Current Everyday Smoker  . Smokeless tobacco: Not on file  . Alcohol Use: No      Review of Systems  All other systems reviewed and are negative.    Allergies  Penicillins and Sulfa antibiotics  Home Medications   Current Outpatient Rx  Name Route Sig Dispense Refill  . ACETAMINOPHEN 500 MG PO TABS Oral Take 1,000 mg by mouth every 6 (six) hours as needed. For pain    . OMEGA-3 FATTY ACIDS 1000 MG PO CAPS Oral Take 1 g by mouth daily.    . ADULT MULTIVITAMIN W/MINERALS CH Oral Take 1 tablet by mouth  daily.      BP 128/87  Pulse 105  Temp(Src) 98 F (36.7 C) (Oral)  Resp 18  SpO2 98%  Physical Exam  Nursing note and vitals reviewed. Constitutional: He appears well-developed and well-nourished. No distress.       Awake, alert, nontoxic appearance  HENT:  Head: Atraumatic.  Mouth/Throat: Oropharynx is clear and moist. No oropharyngeal exudate.  Eyes: Conjunctivae are normal. Right eye exhibits no discharge. Left eye exhibits no discharge.  Neck: Normal range of motion. Neck supple.       Neck mildly tender near anterior cervical region without obvious lymphadenopathy noted.  No rash  Cardiovascular: Normal rate and regular rhythm.   Pulmonary/Chest: Effort normal. No respiratory distress. He exhibits no tenderness.  Abdominal: Soft. There is no tenderness. There is no rebound.  Musculoskeletal: He exhibits no edema and no tenderness.       ROM appears intact, no obvious focal weakness  Lymphadenopathy:    He has no cervical adenopathy.  Neurological: He is alert.  Skin: Skin is warm and dry. No rash noted.  Psychiatric: He has a normal mood and affect.    ED Course  Procedures (including critical care time)  Labs Reviewed - No data to display No results found.   No diagnosis found.  Results for orders placed during the hospital encounter of 10/30/11  D-DIMER, QUANTITATIVE      Component Value Range   D-Dimer, Quant 1.27 (*) 0.00 - 0.48 (ug/mL-FEU)  CBC  Component Value Range   WBC 8.8  4.0 - 10.5 (K/uL)   RBC 4.22  4.22 - 5.81 (MIL/uL)   Hemoglobin 14.7  13.0 - 17.0 (g/dL)   HCT 16.1  09.6 - 04.5 (%)   MCV 97.4  78.0 - 100.0 (fL)   MCH 34.8 (*) 26.0 - 34.0 (pg)   MCHC 35.8  30.0 - 36.0 (g/dL)   RDW 40.9  81.1 - 91.4 (%)   Platelets 373  150 - 400 (K/uL)  DIFFERENTIAL      Component Value Range   Neutrophils Relative 64  43 - 77 (%)   Neutro Abs 5.7  1.7 - 7.7 (K/uL)   Lymphocytes Relative 26  12 - 46 (%)   Lymphs Abs 2.3  0.7 - 4.0 (K/uL)    Monocytes Relative 7  3 - 12 (%)   Monocytes Absolute 0.6  0.1 - 1.0 (K/uL)   Eosinophils Relative 3  0 - 5 (%)   Eosinophils Absolute 0.2  0.0 - 0.7 (K/uL)   Basophils Relative 0  0 - 1 (%)   Basophils Absolute 0.0  0.0 - 0.1 (K/uL)  POCT I-STAT, CHEM 8      Component Value Range   Sodium 139  135 - 145 (mEq/L)   Potassium 4.2  3.5 - 5.1 (mEq/L)   Chloride 102  96 - 112 (mEq/L)   BUN 7  6 - 23 (mg/dL)   Creatinine, Ser 7.82  0.50 - 1.35 (mg/dL)   Glucose, Bld 956 (*) 70 - 99 (mg/dL)   Calcium, Ion 2.13  0.86 - 1.32 (mmol/L)   TCO2 27  0 - 100 (mmol/L)   Hemoglobin 13.9  13.0 - 17.0 (g/dL)   HCT 57.8  46.9 - 62.9 (%)   Dg Chest 2 View  10/25/2011  *RADIOLOGY REPORT*  Clinical Data: Cough, chest pain  CHEST - 2 VIEW  Comparison: 10/17/2004  Findings: Cardiomediastinal silhouette is stable.  There is patchy airspace disease in the right upper lobe perihilar highly suspicious for infiltrate/pneumonia.  Follow-up to resolution after appropriate treatment is recommended.  Bony thorax is stable.  IMPRESSION: Patchy airspace disease right upper lobe perihilar highly suspicious for infiltrate/pneumonia.  Follow-up to resolution after appropriate treatment is recommended.  Original Report Authenticated By: Natasha Mead, M.D.   Ct Angio Chest W/cm &/or Wo Cm  10/30/2011  *RADIOLOGY REPORT*  Clinical Data: Pneumonia at the right lung.  The patient is completed antibiotics.  Cough.  Chest pain.  CT ANGIOGRAPHY CHEST  Technique:  Multidetector CT imaging of the chest using the standard protocol during bolus administration of intravenous contrast. Multiplanar reconstructed images including MIPs were obtained and reviewed to evaluate the vascular anatomy.  Contrast: OMNIPAQUE IOHEXOL 350 MG/ML SOLN  Comparison: 10/25/2011 chest radiograph.  Findings: Inferior right upper lobe consolidation is present. There are areas of cystic change within the consolidation, suggesting necrotizing pneumonia.  This likely  accounts for the patient's hemoptysis.  The study is technically adequate for evaluation of pulmonary embolus.  There is no pulmonary embolism present.  The heart grossly appears within normal limits.  Mild aortic arch atherosclerosis.  No aggressive osseous lesions are identified.  No pericardial effusion.  No pleural effusion.  Incidental imaging of the upper abdomen is unremarkable.  Small hiatal hernia.  Mild distal esophageal thickening suggest reflux disease.  No axillary adenopathy.  No mediastinal or hilar adenopathy.  No aggressive osseous lesions.  Mild thoracic spine DISH.  Follow-up to ensure radiographic clearing recommended.  Clearing is usually observed at 8 weeks.  IMPRESSION: 1.  Technically adequate study without pulmonary embolus. 2.  Inferior right upper lobe consolidation respecting the major fissure, most compatible with pneumonia.  Small cystic areas within the consolidation are compatible with necrotizing pneumonia.  No empyema.  Findings discussed with PA Laveda Norman at 1557 hours on 10/30/2011.  Original Report Authenticated By: Andreas Newport, M.D.      MDM  New onset of hemoptysis.  Recent diagnosis of CAP and was treated with Zpak.  Plan to repeat CXR.  Cannot PERC pt due to age and tachycardia, will obtain d-dimer.  No significant risk factors for TB.  Currently afebrile.    12:45 PM Discussed pt care with my attending, who recommend chest CTA for further evaluation.  Pt agrees with plan.    3:53 PM CT angio confirms evidence of pna, untreated with Z-pak.  Pt's port score is 55.  Pt's CURB score is 0.  There is evidence of consolidation concerning for necrotizing pneumonia, no abscess noted.  Due to the finding, will consult [facility] for admission.     4:26 PM i have consulted with [facility], who agrees to admit pt to med surg bed, team 8, under the care of Dr. Hughie Closs.    Fayrene Helper, PA-C 10/30/11 1627

## 2011-10-30 NOTE — Discharge Instructions (Signed)

## 2011-10-30 NOTE — ED Notes (Signed)
Lab at bedside.  Provided pt with sputum cup and urinal for samples.  Meal tray to bedside.  Wife at bedside.

## 2011-10-30 NOTE — ED Notes (Signed)
Lab at bedside, tech at bedside.

## 2011-10-30 NOTE — ED Notes (Signed)
Snack given, meal tray ordered 

## 2011-10-30 NOTE — ED Notes (Signed)
MD at bedside. 

## 2011-10-31 ENCOUNTER — Inpatient Hospital Stay: Payer: Self-pay | Admitting: Adult Health

## 2011-10-31 ENCOUNTER — Encounter (HOSPITAL_COMMUNITY): Payer: Self-pay | Admitting: Adult Health

## 2011-10-31 DIAGNOSIS — F17200 Nicotine dependence, unspecified, uncomplicated: Secondary | ICD-10-CM

## 2011-10-31 DIAGNOSIS — J159 Unspecified bacterial pneumonia: Secondary | ICD-10-CM

## 2011-10-31 DIAGNOSIS — R042 Hemoptysis: Secondary | ICD-10-CM

## 2011-10-31 DIAGNOSIS — J189 Pneumonia, unspecified organism: Secondary | ICD-10-CM

## 2011-10-31 LAB — BASIC METABOLIC PANEL
CO2: 28 mEq/L (ref 19–32)
Calcium: 9.3 mg/dL (ref 8.4–10.5)
Chloride: 104 mEq/L (ref 96–112)
Creatinine, Ser: 0.94 mg/dL (ref 0.50–1.35)
Glucose, Bld: 118 mg/dL — ABNORMAL HIGH (ref 70–99)

## 2011-10-31 LAB — HEMOGLOBIN A1C
Hgb A1c MFr Bld: 6.2 % — ABNORMAL HIGH (ref <5.7)
Mean Plasma Glucose: 131 mg/dL — ABNORMAL HIGH (ref <117)

## 2011-10-31 LAB — CBC
HCT: 37.1 % — ABNORMAL LOW (ref 39.0–52.0)
MCH: 34.4 pg — ABNORMAL HIGH (ref 26.0–34.0)
MCV: 98.1 fL (ref 78.0–100.0)
Platelets: 324 10*3/uL (ref 150–400)
RDW: 12.4 % (ref 11.5–15.5)

## 2011-10-31 LAB — HIV ANTIBODY (ROUTINE TESTING W REFLEX): HIV: NONREACTIVE

## 2011-10-31 LAB — LEGIONELLA ANTIGEN, URINE

## 2011-10-31 MED ORDER — ALBUTEROL SULFATE HFA 108 (90 BASE) MCG/ACT IN AERS: 2.0000 | INHALATION_SPRAY | RESPIRATORY_TRACT | Status: DC | PRN

## 2011-10-31 MED ORDER — TUBERCULIN PPD 5 UNIT/0.1ML ID SOLN
5.0000 [IU] | Freq: Once | INTRADERMAL | Status: AC
  Administered 2011-10-31: 5 [IU] via INTRADERMAL
  Filled 2011-10-31: qty 0.1

## 2011-10-31 MED ORDER — NICOTINE 21 MG/24HR TD PT24
21.0000 mg | MEDICATED_PATCH | Freq: Every day | TRANSDERMAL | Status: DC
  Administered 2011-10-31: 21 mg via TRANSDERMAL
  Filled 2011-10-31: qty 1

## 2011-10-31 MED ORDER — ALBUTEROL SULFATE HFA 108 (90 BASE) MCG/ACT IN AERS
2.0000 | INHALATION_SPRAY | RESPIRATORY_TRACT | Status: DC | PRN
  Filled 2011-10-31: qty 6.7

## 2011-10-31 MED ORDER — TUBERCULIN PPD 5 UNIT/0.1ML ID SOLN
5.0000 [IU] | Freq: Once | INTRADERMAL | Status: DC
  Filled 2011-10-31: qty 0.1

## 2011-10-31 MED ORDER — MOXIFLOXACIN HCL 400 MG PO TABS: 400.0000 mg | ORAL_TABLET | Freq: Every day | ORAL | Status: AC

## 2011-10-31 MED ORDER — NICOTINE 21 MG/24HR TD PT24: 1.0000 | MEDICATED_PATCH | Freq: Every day | TRANSDERMAL | Status: AC

## 2011-10-31 NOTE — Clinical Social Work Note (Signed)
CSW received consult for "assistance with medications." CSW reviewed chart and consulted with bedside RN in progression. Pt will discharge home when medically ready. RNCM is aware and is following for medication assistance. No further social work needs identified at this time. Please reconsult if a need arises prior to discharge.   Dede Query, MSW, Theresia Majors [phone]

## 2011-10-31 NOTE — Discharge Summary (Signed)
Physician Discharge Summary  Levi Conner ZOX:[id number] DOB: [birth year] DOA: 10/30/2011  PCP: Geraldo Pitter, MD, MD  Admit date: 10/30/2011 Discharge date: 10/31/2011  Discharge Diagnoses:  1-PNA (pneumonia), necrotizing.  2-HTN 3-Transaminases  Discharge Condition: Stable.   Disposition: Follow up with pulmonary. Appointment arrange.   History of present illness:  56 year old with PMH significant for hypertension, borderline Diabetes, who presents to ED complaining of hemoptysis. He relates 1 months history of productive cough, clear sputum, chills, night sweat, shortness of breath. He denies weight loss. He was seen in the ED 5 days ago diagnosed with PNA and treated with Z - Pack. He presented again with persistence of symptoms and hemoptysis. He denies tooth pain.    Hospital Course:   Patient Active Hospital Problem List:   1- Necrotizing PNA (pneumonia):  Patient present with Chronic symptoms: cough, chills, hemoptysis, he has poor dentition. He was treated with Z pack 5 days ago. He presents with hemoptysis. WBC normal probably secondary to partially treated infection. HIV negative, appreciate pulmonary evaluation. Patient will be discharge on avelox 10 days course. He will follow with pulmonary for further care. He will probably need repeated scan.  CT angio negative for Pulmonary Embolism.  2-HTN: Patient stop taking BP medications 3 weeks ago because his BP was low. I will hold BP medication.  3-Pre Diabetes: HBA1 at 6.2. Continue with diet.  4-Transaminases: I will check Hepatitis panel. He denies alcohol use.    Discharge Exam: Filed Vitals:   10/31/11 0939  BP: 117/81  Pulse: 77  Temp: 98.2 F (36.8 C)  Resp: 20   Filed Vitals:   10/30/11 1811 10/30/11 1910 10/30/11 2013 10/31/11 0939  BP: 119/79 111/80 114/77 117/81  Pulse: 94 85 77 77  Temp: 98.4 F (36.9 C) 97.8 F (36.6 C) 98 F (36.7 C) 98.2 F (36.8 C)  TempSrc: Oral  Oral Oral  Resp: 18 20 20 20     SpO2: 100% 98% 95% 97%   General: Patient is in not acute distress.  Cardiovascular: S1, S2, RRR.  Respiratory: Crackles at bases.   Discharge Instructions  Discharge Orders    Future Appointments: Provider: Department: Dept Phone: Center:   10/31/2011 4:00 PM Julio Sicks, NP [facility] [phone] None   11/19/2011 2:30 PM Ardeth Sportsman, MD [facility] [phone] None     Future Orders Please Complete By Expires   Diet general      Increase activity slowly        Medication List  As of 10/31/2011 11:52 AM   TAKE these medications         acetaminophen 500 MG tablet   Commonly known as: TYLENOL   Take 1,000 mg by mouth every 6 (six) hours as needed. For pain      albuterol 108 (90 BASE) MCG/ACT inhaler   Commonly known as: PROVENTIL HFA;VENTOLIN HFA   Inhale 2 puffs into the lungs every 4 (four) hours as needed for wheezing or shortness of breath.      fish oil-omega-3 fatty acids 1000 MG capsule   Take 1 g by mouth daily.      moxifloxacin 400 MG tablet   Commonly known as: AVELOX   Take 1 tablet (400 mg total) by mouth daily.      mulitivitamin with minerals Tabs   Take 1 tablet by mouth daily.      nicotine 21 mg/24hr patch   Commonly known as: NICODERM CQ - dosed in mg/24 hours  Place 1 patch onto the skin daily.           Follow-up Information    Follow up with Geraldo Pitter, MD. Call in 7 days. (As needed if symptoms worsen)    Contact information:   [address] [place] [address] [phone]       Follow up with Rubye Oaks, NP on 11/04/2011. (4:00pm  pulmonary nurse practitioner with chest xray)    Contact information:   [facility] [address] [place] [address] [phone]           The results of significant diagnostics from this hospitalization (including imaging, microbiology, ancillary and laboratory) are listed below for reference.    Significant Diagnostic  Studies: Dg Chest 2 View  10/25/2011  *RADIOLOGY REPORT*  Clinical Data: Cough, chest pain  CHEST - 2 VIEW  Comparison: 10/17/2004  Findings: Cardiomediastinal silhouette is stable.  There is patchy airspace disease in the right upper lobe perihilar highly suspicious for infiltrate/pneumonia.  Follow-up to resolution after appropriate treatment is recommended.  Bony thorax is stable.  IMPRESSION: Patchy airspace disease right upper lobe perihilar highly suspicious for infiltrate/pneumonia.  Follow-up to resolution after appropriate treatment is recommended.  Original Report Authenticated By: Natasha Mead, M.D.   Ct Angio Chest W/cm &/or Wo Cm  10/30/2011  *RADIOLOGY REPORT*  Clinical Data: Pneumonia at the right lung.  The patient is completed antibiotics.  Cough.  Chest pain.  CT ANGIOGRAPHY CHEST  Technique:  Multidetector CT imaging of the chest using the standard protocol during bolus administration of intravenous contrast. Multiplanar reconstructed images including MIPs were obtained and reviewed to evaluate the vascular anatomy.  Contrast: OMNIPAQUE IOHEXOL 350 MG/ML SOLN  Comparison: 10/25/2011 chest radiograph.  Findings: Inferior right upper lobe consolidation is present. There are areas of cystic change within the consolidation, suggesting necrotizing pneumonia.  This likely accounts for the patient's hemoptysis.  The study is technically adequate for evaluation of pulmonary embolus.  There is no pulmonary embolism present.  The heart grossly appears within normal limits.  Mild aortic arch atherosclerosis.  No aggressive osseous lesions are identified.  No pericardial effusion.  No pleural effusion.  Incidental imaging of the upper abdomen is unremarkable.  Small hiatal hernia.  Mild distal esophageal thickening suggest reflux disease.  No axillary adenopathy.  No mediastinal or hilar adenopathy.  No aggressive osseous lesions.  Mild thoracic spine DISH.  Follow-up to ensure radiographic clearing  recommended.  Clearing is usually observed at 8 weeks.  IMPRESSION: 1.  Technically adequate study without pulmonary embolus. 2.  Inferior right upper lobe consolidation respecting the major fissure, most compatible with pneumonia.  Small cystic areas within the consolidation are compatible with necrotizing pneumonia.  No empyema.  Findings discussed with PA Laveda Norman at 1557 hours on 10/30/2011.  Original Report Authenticated By: Andreas Newport, M.D.    Microbiology: Recent Results (from the past 240 hour(s))  CULTURE, RESPIRATORY     Status: Normal (Preliminary result)   Collection Time   10/30/11  6:42 PM      Component Value Range Status Comment   Specimen Description SPUTUM   Final    Special Requests PT IN ED WHEN SPECIMEN COLLECTED   Final    Gram Stain     Final    Value: NO WBC SEEN     FEW SQUAMOUS EPITHELIAL CELLS PRESENT     FEW GRAM POSITIVE COCCI IN PAIRS   Culture PENDING  Incomplete    Report Status PENDING   Incomplete      Labs: Basic Metabolic Panel:  Lab 10/31/11 0865 10/30/11 1941 10/30/11 1443 10/25/11 1203  NA 139 -- 139 126*  K 4.2 -- 4.2 --  CL 104 -- 102 92*  CO2 28 -- -- 22  GLUCOSE 118* -- 121* 151*  BUN 10 -- 7 15  CREATININE 0.94 0.95 1.10 0.85  CALCIUM 9.3 -- -- 8.9  MG -- -- -- --  PHOS -- -- -- --   Liver Function Tests:  Lab 10/25/11 1203  AST 62*  ALT 47  ALKPHOS 148*  BILITOT 0.6  PROT 7.2  ALBUMIN 2.4*   CBC:  Lab 10/31/11 0446 10/30/11 1941 10/30/11 1443 10/30/11 1248 10/25/11 1203  WBC 6.5 7.5 -- 8.8 15.1*  NEUTROABS -- -- -- 5.7 12.2*  HGB 13.0 13.2 13.9 14.7 13.3  HCT 37.1* 38.3* 41.0 41.1 36.4*  MCV 98.1 98.0 -- 97.4 95.5  PLT 324 361 -- 373 375    Time coordinating discharge: 30 minutes.  SignedHartley Barefoot  [facility] 10/31/2011, 11:52 AM

## 2011-10-31 NOTE — Progress Notes (Signed)
PPD administered on the right forearm. Pt will follow up @ MD office on Monday to read PPD. Rema Fendt, RN

## 2011-10-31 NOTE — Progress Notes (Signed)
Pt D/C'd home @ 1525 per MD order. Pt given D/C instructions with Rx's. Pt received meds from pharm prior to D/C. Rema Fendt, RN

## 2011-10-31 NOTE — Care Management Note (Signed)
    Page 1 of 1   10/31/2011     1:35:01 PM   CARE MANAGEMENT NOTE 10/31/2011  Patient:  Levi Conner, Levi Conner   Account Number:  [id number]  Date Initiated:  10/31/2011  Documentation initiated by:  [facility]  Subjective/Objective Assessment:   Admitted with pneumonia.Lives with friends.     Action/Plan:   Anticipated DC Date:  10/31/2011   Anticipated DC Plan:  HOME/SELF CARE  In-house referral  Financial Counselor      DC Planning Services  CM consult  Medication Assistance      Choice offered to / List presented to:             Status of service:  Completed, signed off Medicare Important Message given?   (If response is "NO", the following Medicare IM given date fields will be blank) Date Medicare IM given:   Date Additional Medicare IM given:    Discharge Disposition:  HOME/SELF CARE  Per UR Regulation:  Reviewed for med. necessity/level of care/duration of stay  If discussed at Long Length of Stay Meetings, dates discussed:    Comments:  PCP [facility]  10/31/11 Spoke with patient about d/c plans,plans to return home, lives with friends. He [facility] to the [facility], has orange card for discounted visit cost. Gave patient discount pharmacy card, made referral to financial counselor, patient not eligible for indigent fund but obtained po avelox rx per care management override for antibiotics. Jacquelynn Cree RN, BSN, CCM

## 2011-10-31 NOTE — Consult Note (Addendum)
Patient: Levi Conner DOB: [birth year] Date of Admission: 10/30/2011            Pulmonary consult  Date of Consult: 10/31/2011 MD requesting consult: [place] Reason for consult: necrotizing pna   HPI -  56yo male with hx HTN who presented 5/8 with 1 month hx productive cough, chills, night sweats and SOB.  He was seen as outpt and rx for pna with Azithro however he did not improve and on 5/8 developed hemoptysis and presented to ED.  He was admitted by [facility] and CT chest showed RUL necrotizing pna and PCCM was consulted.   Allergies:  Allergies  Allergen Reactions  . Penicillins Other (See Comments)    unknown  . Sulfa Antibiotics Other (See Comments)    unknown     PMH: Past Medical History  Diagnosis Date  . Hypertension   . Pneumonia     Home meds: Medications Prior to Admission  Medication Sig Dispense Refill  . acetaminophen (TYLENOL) 500 MG tablet Take 1,000 mg by mouth every 6 (six) hours as needed. For pain      . fish oil-omega-3 fatty acids 1000 MG capsule Take 1 g by mouth daily.      . Multiple Vitamin (MULITIVITAMIN WITH MINERALS) TABS Take 1 tablet by mouth daily.         Social Hx: History   Social History  . Marital Status: Single    Spouse Name: N/A    Number of Children: N/A  . Years of Education: N/A   Occupational History  . Holiday representative    Social History Main Topics  . Smoking status: Current Everyday Smoker -- 1.0 packs/day for 30 years    Types: Cigarettes  . Smokeless tobacco: Not on file  . Alcohol Use: No  . Drug Use: No  . Sexually Active: Not on file   Other Topics Concern  . Not on file   Social History Narrative   Is concerned about a significant mold problem in his friend's home where he is living.      Family Hx: History reviewed. No pertinent family history.   ROS: C/o general malaise although much improved. Has had chills, cough. Some mild SOB and cough although "hemoptysis" resolved.  Hemoptysis was sputum  streaked with small amount dark blood.  Denies chest pain, recent travel, recent sick contacts, leg/calf pain.  All other systems reviewed and were neg.   Filed Vitals:   10/30/11 1643 10/30/11 1811 10/30/11 1910 10/30/11 2013  BP: 115/82 119/79 111/80 114/77  Pulse: 91 94 85 77  Temp:  98.4 F (36.9 C) 97.8 F (36.6 C) 98 F (36.7 C)  TempSrc:  Oral  Oral  Resp: 18 18 20 20   SpO2: 99% 100% 98% 95%    chest X-ray Ct Angio Chest W/cm &/or Wo Cm  10/30/2011  *RADIOLOGY REPORT*  Clinical Data: Pneumonia at the right lung.  The patient is completed antibiotics.  Cough.  Chest pain.  CT ANGIOGRAPHY CHEST  Technique:  Multidetector CT imaging of the chest using the standard protocol during bolus administration of intravenous contrast. Multiplanar reconstructed images including MIPs were obtained and reviewed to evaluate the vascular anatomy.  Contrast: OMNIPAQUE IOHEXOL 350 MG/ML SOLN  Comparison: 10/25/2011 chest radiograph.  Findings: Inferior right upper lobe consolidation is present. There are areas of cystic change within the consolidation, suggesting necrotizing pneumonia.  This likely accounts for the patient's hemoptysis.  The study is technically adequate for evaluation of pulmonary embolus.  There is no pulmonary embolism present.  The heart grossly appears within normal limits.  Mild aortic arch atherosclerosis.  No aggressive osseous lesions are identified.  No pericardial effusion.  No pleural effusion.  Incidental imaging of the upper abdomen is unremarkable.  Small hiatal hernia.  Mild distal esophageal thickening suggest reflux disease.  No axillary adenopathy.  No mediastinal or hilar adenopathy.  No aggressive osseous lesions.  Mild thoracic spine DISH.  Follow-up to ensure radiographic clearing recommended.  Clearing is usually observed at 8 weeks.  IMPRESSION: 1.  Technically adequate study without pulmonary embolus. 2.  Inferior right upper lobe consolidation respecting the major  fissure, most compatible with pneumonia.  Small cystic areas within the consolidation are compatible with necrotizing pneumonia.  No empyema.  Findings discussed with PA Laveda Norman at 1557 hours on 10/30/2011.  Original Report Authenticated By: Andreas Newport, M.D.      CBC    Component Value Date/Time   WBC 6.5 10/31/2011 0446   RBC 3.78* 10/31/2011 0446   HGB 13.0 10/31/2011 0446   HCT 37.1* 10/31/2011 0446   PLT 324 10/31/2011 0446   MCV 98.1 10/31/2011 0446   MCH 34.4* 10/31/2011 0446   MCHC 35.0 10/31/2011 0446   RDW 12.4 10/31/2011 0446   LYMPHSABS 2.3 10/30/2011 1248   MONOABS 0.6 10/30/2011 1248   EOSABS 0.2 10/30/2011 1248   BASOSABS 0.0 10/30/2011 1248     BMET    Component Value Date/Time   NA 139 10/31/2011 0446   K 4.2 10/31/2011 0446   CL 104 10/31/2011 0446   CO2 28 10/31/2011 0446   GLUCOSE 118* 10/31/2011 0446   BUN 10 10/31/2011 0446   CREATININE 0.94 10/31/2011 0446   CALCIUM 9.3 10/31/2011 0446   GFRNONAA >90 10/31/2011 0446   GFRAA >90 10/31/2011 0446     ABG    Component Value Date/Time   TCO2 27 10/30/2011 1443      EXAM: General: very pleasant male, NAD, reading paper  Neuro: awake, alert, MAE CV: s1s2 rrr PULM: resps even non labored on RA, slightly diminished R, exp wheeze L GI: abd soft, +bs Extremities:  Warm and dry no edema    IMPRESSION/ PLAN:  RUL PNA with evidence of mild necrosis - he felt better with Azithro but developed scant hemoptysis which prompted eval in ED and admission. In all likelihood, this is a routine bacterial CAP. However, the prolonged prodrome, upper lobe location and hemoptysis all raise the possibility of reactivation TB   REC: Place PPD Obtain one sputum specimen for AFB smear/cx OK for D/C to home Complete 10 days abx with Avelox We have arranged f/u on Mon 5/13 in [facility] Pulmonary office for recheck and to read PPD. He will need CXR in 4-6 wks to ensure resolution If hemoptysis persists, FOB will be indicated I have counseled re: smoking  cessation -    Discussed with Dr Elsworth Soho, MD;  PCCM service; Mobile [phone]

## 2011-11-02 LAB — CULTURE, RESPIRATORY W GRAM STAIN: Culture: NORMAL

## 2011-11-02 NOTE — ED Provider Notes (Signed)
Medical screening examination/treatment/procedure(s) were performed by non-physician practitioner and as supervising physician I was immediately available for consultation/collaboration.   Laray Anger, DO 11/02/11 1120

## 2011-11-04 ENCOUNTER — Encounter: Payer: Self-pay | Admitting: Adult Health

## 2011-11-04 ENCOUNTER — Ambulatory Visit (INDEPENDENT_AMBULATORY_CARE_PROVIDER_SITE_OTHER): Payer: Self-pay | Admitting: Adult Health

## 2011-11-04 ENCOUNTER — Ambulatory Visit (INDEPENDENT_AMBULATORY_CARE_PROVIDER_SITE_OTHER)
Admission: RE | Admit: 2011-11-04 | Discharge: 2011-11-04 | Disposition: A | Discharge status: Home / Self Care | Payer: Self-pay | Source: Ambulatory Visit | Attending: Adult Health | Admitting: Adult Health

## 2011-11-04 VITALS — BP 130/78 | HR 98 | Temp 98.0°F | Ht 67.0 in | Wt 148.0 lb

## 2011-11-04 DIAGNOSIS — J189 Pneumonia, unspecified organism: Secondary | ICD-10-CM

## 2011-11-04 IMAGING — CR DG CHEST 2V
2 series · 2 of 2 positions shown · non-contrast
Comparison: Chest CTA [DATE] and earlier.

CLINICAL DATA: 55 year-old male with shortness of breath.
Pneumonia.

CHEST - 2 VIEW

[view not recorded (1 of 2)]
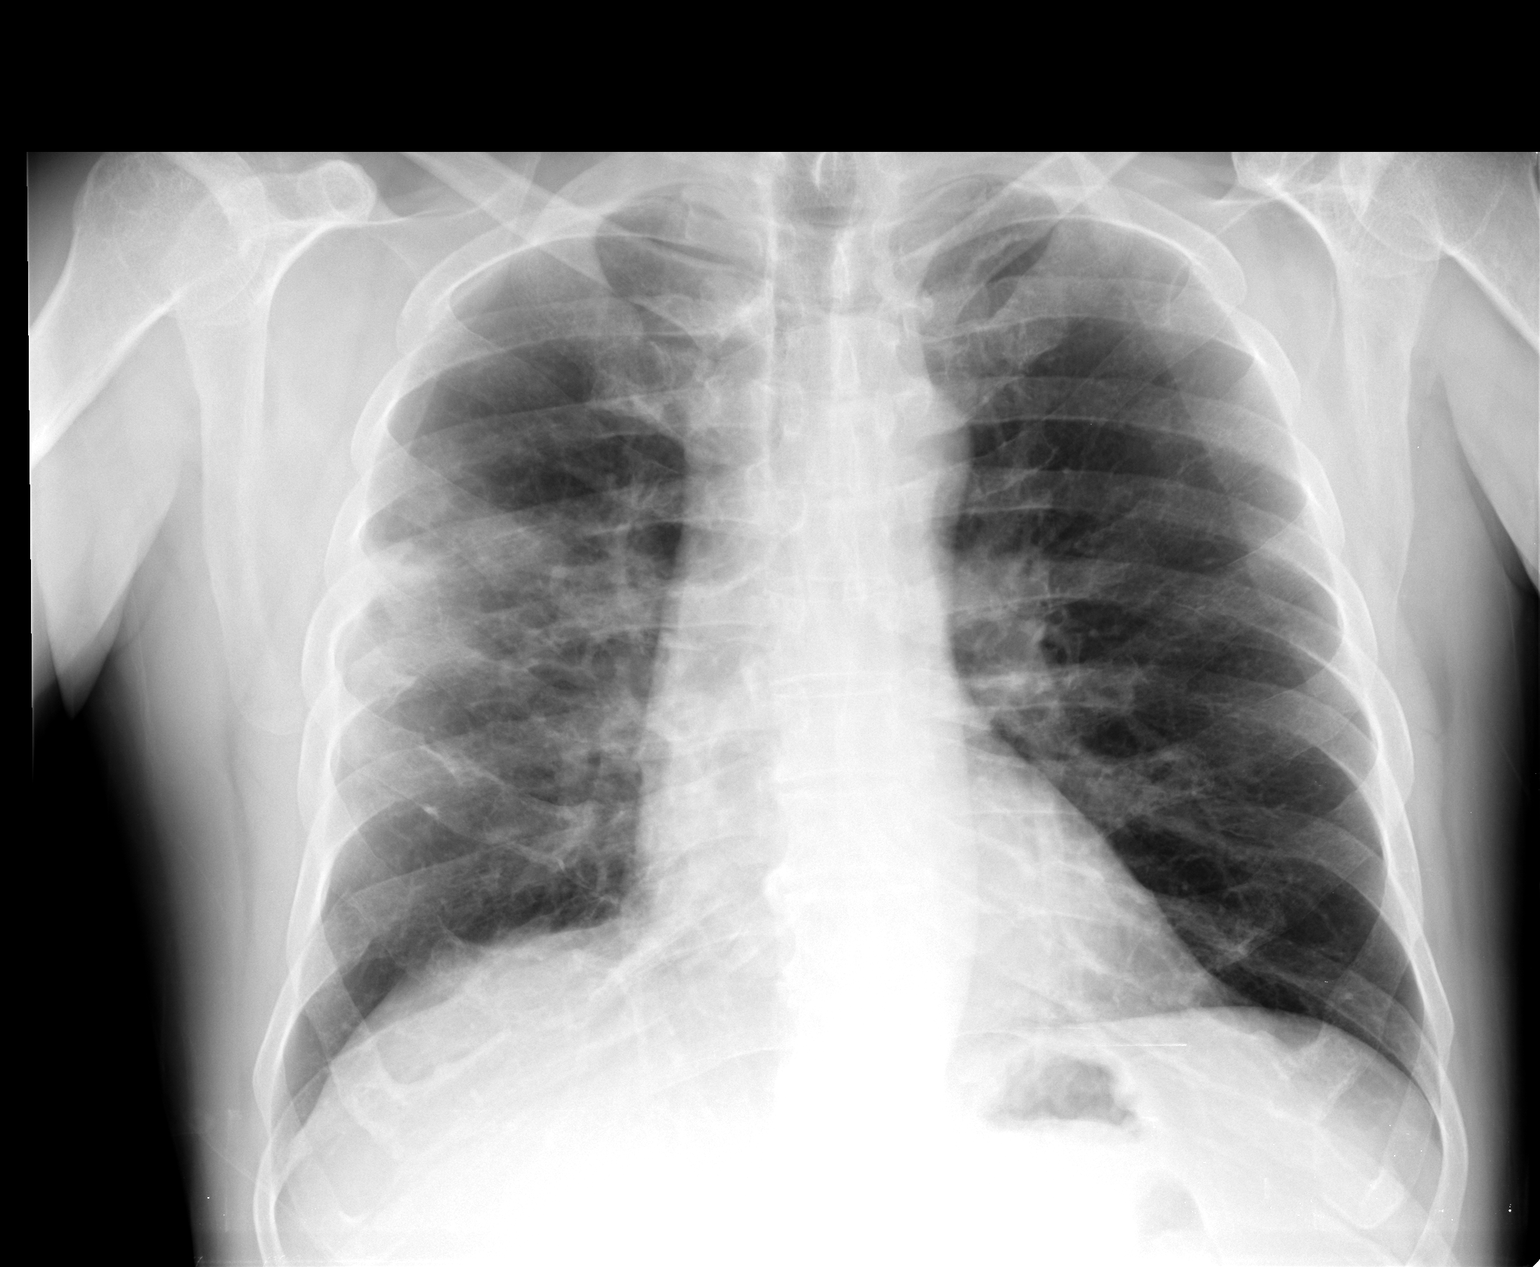

[view not recorded (2 of 2)]
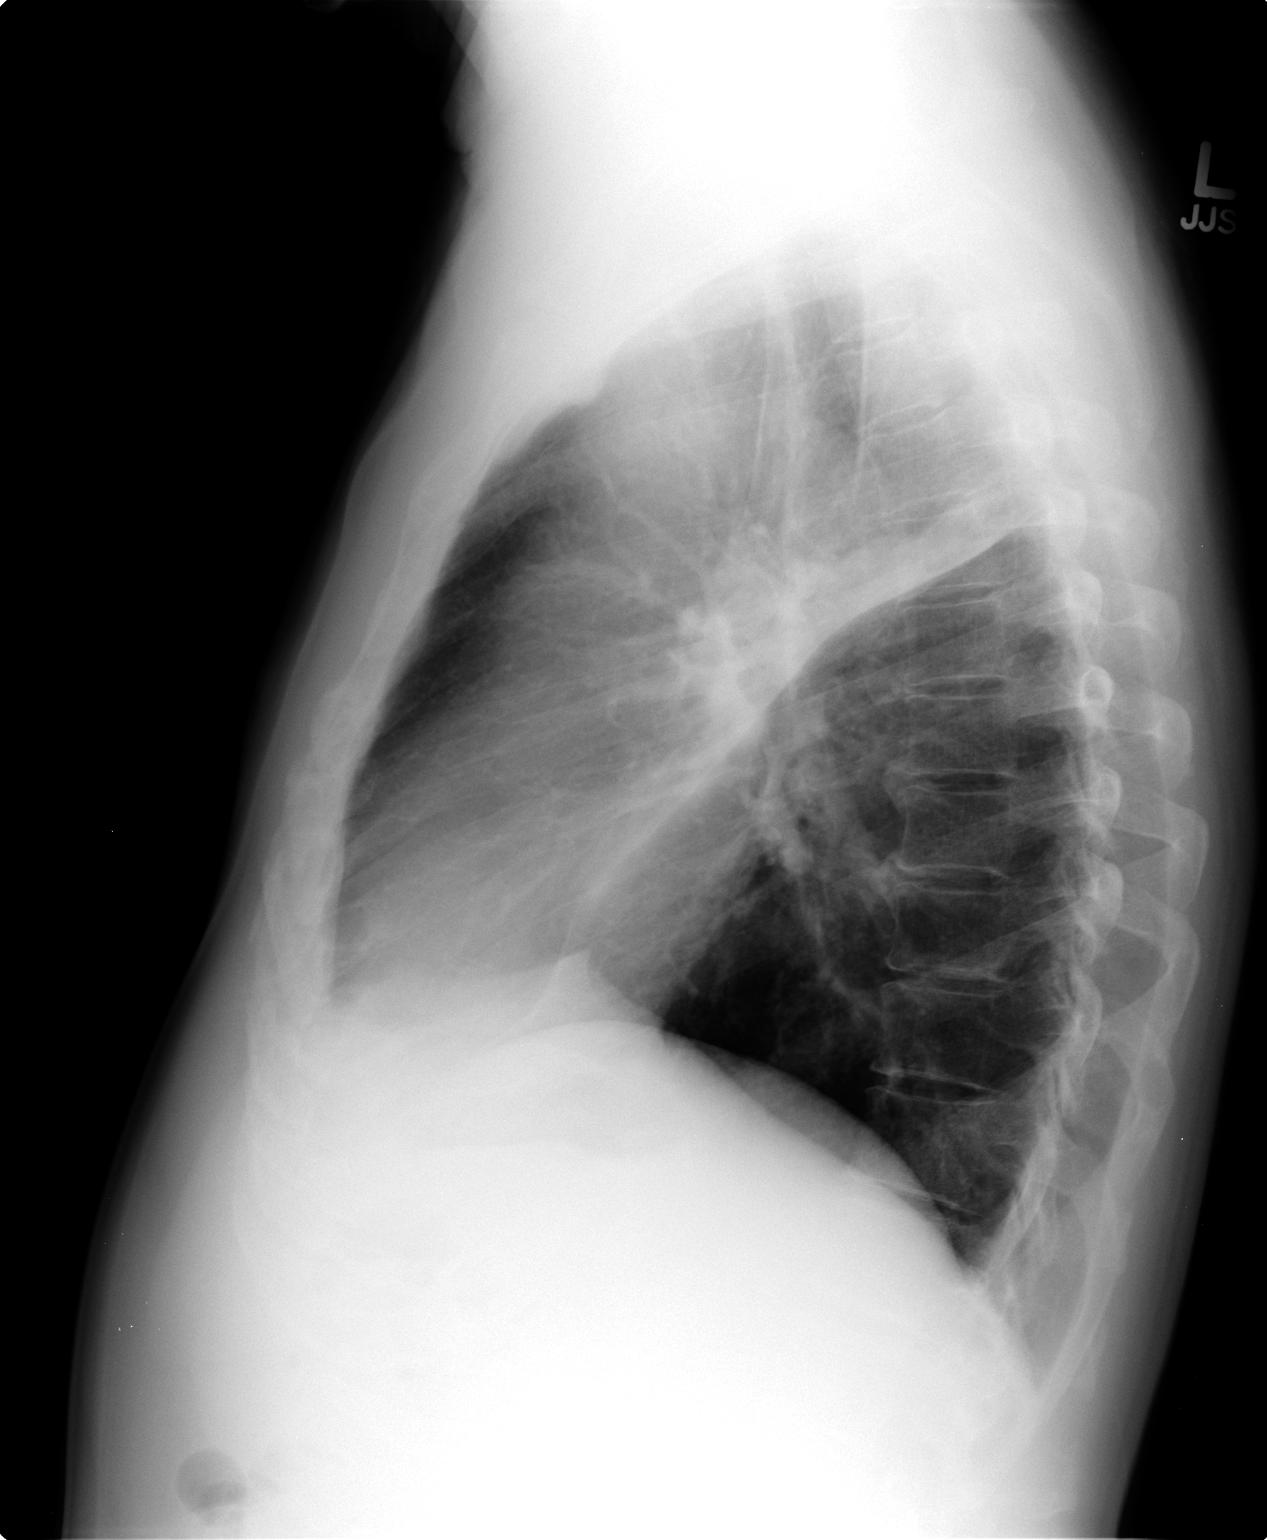

[2 of 2 positions shown; findings below may reference images not displayed]

FINDINGS: Right lung airspace opacity has decreased in confluent.
No new areas of involvement.  There is residual consolidation along
the major fissure primarily in the upper lobe.  No pleural
effusion.  Cardiac size and mediastinal contours are within normal
limits.  Visualized tracheal air column is within normal limits.
The left lung remains clear. No acute osseous abnormality
identified.
IMPRESSION: Continued right lung pneumonia, minor radiographic regression since
[DATE].

## 2011-11-04 NOTE — Patient Instructions (Addendum)
Finish Avelox as directed.  Mucinex DM Twice daily  As needed  Cough/congestion  Advance activity as tolerated.  Follow up in office in 2-3  weeks with chest xray - with first available doctor .  MOST IMPORTANT GOAL IS TO QUIT SMOKING

## 2011-11-04 NOTE — Assessment & Plan Note (Signed)
Right sided necrotizing PNA - clinically improving  No significant change in xray  Neg HIV  Neg PPD    Plan  Finish avelox  follow up in 2-3 week with cxr  Smoking cesstation

## 2011-11-04 NOTE — Progress Notes (Signed)
  Subjective:    Patient ID: Levi Conner, male    DOB: [birth year], 56 y.o.   MRN: [id number]  HPI 56 year old male smoker seen for initial pulmonary consult during hospital admission, 10/30/2011 for necrotizing pneumonia  11/04/2011 post hospital followup. Patient returns for a post hospital followup. Patient was admitted May 8 to 10/31/2011 for a necrotizing pneumonia. Prior to admission patient was treated for a pneumonia with a Z-Pak without any improvement. Chest x-ray showed a patchy airspace disease in the right upper lobe. Subsequent CT scan showed inferior right upper lobe consolidation with small cystic areas within the consolidation that are compatible with a necrotizing pneumonia. No empyema or pulmonary emboli. Workup showed a normal white blood cell till negative, HIV. A PPD was neg  Patient was discharged on a ten-day course of Avelox.   Since discharge. Patient is feeling better , but still weak with intermittent shortness of breath. Cough is decreased . No hemoptysis.  CXR today with little change .  Has few days left on avelox .     Review of Systems Constitutional:   No  weight loss, night sweats,  Fevers, chills,  +fatigue, or  lassitude.  HEENT:   No headaches,  Difficulty swallowing,  Tooth/dental problems, or  Sore throat,                No sneezing, itching, ear ache, nasal congestion, post nasal drip,   CV:  No chest pain,  Orthopnea, PND, swelling in lower extremities, anasarca, dizziness, palpitations, syncope.   GI  No heartburn, indigestion, abdominal pain, nausea, vomiting, diarrhea, change in bowel habits, loss of appetite, bloody stools.   Resp:   No coughing up of blood.    No chest wall deformity  Skin: no rash or lesions.  GU: no dysuria, change in color of urine, no urgency or frequency.  No flank pain, no hematuria   MS:  No joint pain or swelling.  No decreased range of motion.  No back pain.  Psych:  No change in mood or affect. No  depression or anxiety.  No memory loss.         Objective:   Physical Exam GEN: A/Ox3; pleasant , NAD, well nourished   HEENT:  [place]/AT,  EACs-clear, TMs-wnl, NOSE-clear, THROAT-clear, no lesions, no postnasal drip or exudate noted.   NECK:  Supple w/ fair ROM; no JVD; normal carotid impulses w/o bruits; no thyromegaly or nodules palpated; no lymphadenopathy.  RESP  Clear  P & A; w/o, wheezes/ rales/ or rhonchi.no accessory muscle use, no dullness to percussion  CARD:  RRR, no m/r/g  , no peripheral edema, pulses intact, no cyanosis or clubbing.  GI:   Soft & nt; nml bowel sounds; no organomegaly or masses detected.  Musco: Warm bil, no deformities or joint swelling noted.   Neuro: alert, no focal deficits noted.    Skin: Warm, no lesions or rashes   PPD neg  CXR no sign change in right aspdz        Assessment & Plan:

## 2011-11-06 LAB — CULTURE, BLOOD (ROUTINE X 2)
Culture  Setup Time: 201305090130
Culture: NO GROWTH

## 2011-11-14 ENCOUNTER — Other Ambulatory Visit: Payer: Self-pay | Admitting: Internal Medicine

## 2011-11-14 DIAGNOSIS — J189 Pneumonia, unspecified organism: Secondary | ICD-10-CM

## 2011-11-15 ENCOUNTER — Encounter: Payer: Self-pay | Admitting: Internal Medicine

## 2011-11-15 ENCOUNTER — Ambulatory Visit (INDEPENDENT_AMBULATORY_CARE_PROVIDER_SITE_OTHER): Payer: Self-pay | Admitting: Internal Medicine

## 2011-11-15 VITALS — BP 130/88 | HR 102 | Temp 98.2°F | Ht 67.0 in | Wt 150.2 lb

## 2011-11-15 DIAGNOSIS — J189 Pneumonia, unspecified organism: Secondary | ICD-10-CM

## 2011-11-15 NOTE — Progress Notes (Signed)
  Subjective:    Patient ID: Levi Conner, male    DOB: [birth year], 57 y.o.   MRN: [id number]  HPI 56 year old male smoker seen for initial pulmonary consult during hospital admission, 10/30/2011 for necrotizing pneumonia  11/04/2011 post hospital followup. Patient returns for a post hospital followup. Patient was admitted May 8 to 10/31/2011 for a necrotizing pneumonia. Prior to admission patient was treated for a pneumonia with a Z-Pak without any improvement. Chest x-ray showed a patchy airspace disease in the right upper lobe. Subsequent CT scan showed inferior right upper lobe consolidation with small cystic areas within the consolidation that are compatible with a necrotizing pneumonia. No empyema or pulmonary emboli. Workup showed a normal white blood cell till negative, HIV. A PPD was neg  Patient was discharged on a ten-day course of Avelox. Since discharge. Patient is feeling better , but still weak with intermittent shortness of breath. Cough is decreased . No hemoptysis.  CXR today with little change .  Has few days left on avelox . rec Finish Avelox as directed.  Mucinex DM Twice daily  As needed  Cough/congestion  Advance activity as tolerated.  Follow up in office in 2-3  weeks with chest xray - with first available doctor .  MOST IMPORTANT GOAL IS TO QUIT SMOKING   11/15/2011 f/u ov/[name] still smoking but completely back to baseline doe and min am cough. No obvious daytime variabilty  no cp or chest tightness, subjective wheeze overt sinus or hb symptoms. No unusual exp hx   Sleeping ok without nocturnal  or early am exacerbation  of respiratory  c/o's or need for noct saba. Also denies any obvious fluctuation of symptoms with weather or environmental changes or other aggravating or alleviating factors except as outlined above                    Objective:   Physical Exam GEN: A/Ox3; pleasant , NAD, well nourished   HEENT:  [place]/AT,  EACs-clear, TMs-wnl,  NOSE-clear, THROAT-clear, no lesions, no postnasal drip or exudate noted.   NECK:  Supple w/ fair ROM; no JVD; normal carotid impulses w/o bruits; no thyromegaly or nodules palpated; no lymphadenopathy.  RESP  slt exp rhonchi ? slt  worse on R, no accessory muscle use, no dullness to percussion  CARD:  RRR, no m/r/g  , no peripheral edema, pulses intact, no cyanosis or clubbing.  GI:   Soft & nt; nml bowel sounds; no organomegaly or masses detected.  Musco: Warm bil, no deformities or joint swelling noted.   Neuro: alert, no focal deficits noted.    Skin: Warm, no lesions or rashes     11/04/11 cxr Continued right lung pneumonia, minor radiographic regression since  10/25/2011.       Assessment & Plan:

## 2011-11-15 NOTE — Patient Instructions (Signed)
Return 3rd week in June for final follow up cxr to establish complete clearing of the abnormalities from your pneumonia  You need to completely quit smoking before surgery if possible

## 2011-11-19 ENCOUNTER — Ambulatory Visit (INDEPENDENT_AMBULATORY_CARE_PROVIDER_SITE_OTHER): Payer: Self-pay | Admitting: Surgery

## 2011-11-25 NOTE — Assessment & Plan Note (Signed)
Chest x-ray showed a patchy airspace disease in the right upper lobe. Subsequent CT scan showed inferior right upper lobe consolidation with small cystic areas within the consolidation that are compatible with a necrotizing pneumonia. No empyema or pulmonary emboli. >neg  HIV >PPD neg  >tx w/ 10 days Avelox completed 11/09/11  Needs a 6 week (from onset ) f/u cxr and consideration for fob then if not completely clear.

## 2011-11-26 ENCOUNTER — Ambulatory Visit (INDEPENDENT_AMBULATORY_CARE_PROVIDER_SITE_OTHER): Payer: PRIVATE HEALTH INSURANCE | Admitting: Surgery

## 2011-11-26 ENCOUNTER — Encounter: Payer: Self-pay | Admitting: Internal Medicine

## 2011-11-26 ENCOUNTER — Encounter (INDEPENDENT_AMBULATORY_CARE_PROVIDER_SITE_OTHER): Payer: Self-pay | Admitting: Surgery

## 2011-11-26 VITALS — BP 130/90 | HR 91 | Temp 98.6°F | Resp 14 | Ht 67.0 in | Wt 150.6 lb

## 2011-11-26 DIAGNOSIS — Z1211 Encounter for screening for malignant neoplasm of colon: Secondary | ICD-10-CM

## 2011-11-26 DIAGNOSIS — K429 Umbilical hernia without obstruction or gangrene: Secondary | ICD-10-CM

## 2011-11-26 DIAGNOSIS — K4020 Bilateral inguinal hernia, without obstruction or gangrene, not specified as recurrent: Secondary | ICD-10-CM | POA: Insufficient documentation

## 2011-11-26 DIAGNOSIS — K648 Other hemorrhoids: Secondary | ICD-10-CM | POA: Insufficient documentation

## 2011-11-26 NOTE — Progress Notes (Signed)
Subjective:     Patient ID: Levi Conner, male   DOB: 1956/03/08, 56 y.o.   MRN: [id number]  HPI  Levi Conner  07/27/1955 [id number]  Patient Care Team: Geraldo Pitter, MD as PCP - General (Family Medicine) Nyoka Cowden, MD as Consulting Physician (Pulmonary Disease) Ardeth Sportsman, MD as Consulting Physician (General Surgery)  This patient is a 56 y.o.male who presents today for surgical evaluation at the request of Dr. Parke Simmers.   Reason for visit: Groin hernias. Anal mass.  Patient is a middle-aged male. Recently stopped smoking. Has had groin swellings for decades. 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 However, he's had worsening groin pain and can no longer work more than a day or 2 before the pain gets severe. He mentioned this his primary care physician. She recommended surgical evaluation for hernias in both groins. On ABx for a pneumonia followed by Dr. Sherene Sires  The patient also notes that he's had a mass around the anus for a while. Painful periods of discomfort. Has a bowel movement everyday. None any fiber supplements. He can walk 60 minutes a day in the morning regularly. No prior abdominal surgery. No prior hernia surgery.  Patient Active Problem List  Diagnoses  . PNA (pneumonia)  . CAP (community acquired pneumonia)  . Hemoptysis  . Smoker  . Bilateral inguinal hernia (BIH)  . Umbilical hernia  . Internal prolapsed hemorrhoids    Past Medical History  Diagnosis Date  . Hypertension   . Pneumonia   . Borderline diabetic     Past Surgical History  Procedure Date  . No past surgeries     History   Social History  . Marital Status: Single    Spouse Name: N/A    Number of Children: N/A  . Years of Education: N/A   Occupational History  . Holiday representative    Social History Main Topics  . Smoking status: Former Smoker -- 0.3 packs/day for 30 years    Types: Cigarettes    Quit date: 10/23/2011  . Smokeless tobacco: Never Used  . Alcohol  Use: No  . Drug Use: No  . Sexually Active: Not Currently   Other Topics Concern  . Not on file   Social History Narrative   Is concerned about a significant mold problem in his friend's home where he is living.     No family history on file.  Current Outpatient Prescriptions  Medication Sig Dispense Refill  . lisinopril (PRINIVIL,ZESTRIL) 20 MG tablet Take 20 mg by mouth daily.      . Multiple Vitamin (MULITIVITAMIN WITH MINERALS) TABS Take 1 tablet by mouth daily.      . nicotine (NICODERM CQ - DOSED IN MG/24 HOURS) 21 mg/24hr patch Place 1 patch onto the skin daily.  28 patch  0     Allergies  Allergen Reactions  . Penicillins Other (See Comments)    unknown  . Sulfa Antibiotics Other (See Comments)    unknown    BP 130/90  Pulse 91  Temp(Src) 98.6 F (37 C) (Temporal)  Resp 14  Ht 5\' 7"  (1.702 m)  Wt 150 lb 9.6 oz (68.312 kg)  BMI 23.59 kg/m2  Dg Chest 2 View  11/04/2011  *RADIOLOGY REPORT*  Clinical Data: 56 year-old male with shortness of breath. Pneumonia.  CHEST - 2 VIEW  Comparison: Chest CTA 10/30/2011 and earlier.  Findings: Right lung airspace opacity has decreased in confluent. No new areas of involvement.  There is residual  consolidation along the major fissure primarily in the upper lobe.  No pleural effusion.  Cardiac size and mediastinal contours are within normal limits.  Visualized tracheal air column is within normal limits. The left lung remains clear. No acute osseous abnormality identified.  IMPRESSION: Continued right lung pneumonia, minor radiographic regression since 10/25/2011.  Original Report Authenticated By: Harley Hallmark, M.D.   Ct Angio Chest W/cm &/or Wo Cm  10/30/2011  *RADIOLOGY REPORT*  Clinical Data: Pneumonia at the right lung.  The patient is completed antibiotics.  Cough.  Chest pain.  CT ANGIOGRAPHY CHEST  Technique:  Multidetector CT imaging of the chest using the standard protocol during bolus administration of intravenous contrast.  Multiplanar reconstructed images including MIPs were obtained and reviewed to evaluate the vascular anatomy.  Contrast: OMNIPAQUE IOHEXOL 350 MG/ML SOLN  Comparison: 10/25/2011 chest radiograph.  Findings: Inferior right upper lobe consolidation is present. There are areas of cystic change within the consolidation, suggesting necrotizing pneumonia.  This likely accounts for the patient's hemoptysis.  The study is technically adequate for evaluation of pulmonary embolus.  There is no pulmonary embolism present.  The heart grossly appears within normal limits.  Mild aortic arch atherosclerosis.  No aggressive osseous lesions are identified.  No pericardial effusion.  No pleural effusion.  Incidental imaging of the upper abdomen is unremarkable.  Small hiatal hernia.  Mild distal esophageal thickening suggest reflux disease.  No axillary adenopathy.  No mediastinal or hilar adenopathy.  No aggressive osseous lesions.  Mild thoracic spine DISH.  Follow-up to ensure radiographic clearing recommended.  Clearing is usually observed at 8 weeks.  IMPRESSION: 1.  Technically adequate study without pulmonary embolus. 2.  Inferior right upper lobe consolidation respecting the major fissure, most compatible with pneumonia.  Small cystic areas within the consolidation are compatible with necrotizing pneumonia.  No empyema.  Findings discussed with PA Laveda Norman at 1557 hours on 10/30/2011.  Original Report Authenticated By: Andreas Newport, M.D.     Review of Systems  Constitutional: Negative for fever, chills and diaphoresis.  HENT: Negative for nosebleeds, sore throat, facial swelling, mouth sores, trouble swallowing and ear discharge.   Eyes: Negative for photophobia, discharge and visual disturbance.  Respiratory: Negative for choking, chest tightness, shortness of breath and stridor.   Cardiovascular: Negative for chest pain and palpitations.       Patient walks 60 minutes for about 2 miles without difficulty.  No  exertional chest/neck/shoulder/arm pain.   Gastrointestinal: Negative for nausea, vomiting, abdominal pain, diarrhea, constipation, blood in stool, abdominal distention, anal bleeding and rectal pain.       No personal nor family history of GI/colon cancer, inflammatory bowel disease, irritable bowel syndrome, allergy such as Celiac Sprue, dietary/dairy problems, colitis, ulcers nor gastritis.    No recent sick contacts/gastroenteritis.  No travel outside the country.  No changes in diet.    Genitourinary: Negative for dysuria, urgency, difficulty urinating and testicular pain.  Musculoskeletal: Negative for myalgias, back pain, arthralgias and gait problem.  Skin: Negative for color change, pallor, rash and wound.  Neurological: Negative for dizziness, speech difficulty, weakness, numbness and headaches.  Hematological: Negative for adenopathy. Does not bruise/bleed easily.  Psychiatric/Behavioral: Negative for hallucinations, confusion and agitation.       Objective:   Physical Exam  Constitutional: He is oriented to person, place, and time. He appears well-developed and well-nourished. No distress.  HENT:  Head: Normocephalic.  Mouth/Throat: Oropharynx is clear and moist. No oropharyngeal exudate.  Eyes: Conjunctivae and  EOM are normal. Pupils are equal, round, and reactive to light. No scleral icterus.  Neck: Normal range of motion. Neck supple. No tracheal deviation present.  Cardiovascular: Normal rate, regular rhythm and intact distal pulses.   Pulmonary/Chest: Effort normal and breath sounds normal. No respiratory distress.  Abdominal: Soft. He exhibits no distension. There is no tenderness. A hernia is present. Hernia confirmed positive in the ventral area, confirmed positive in the right inguinal area and confirmed positive in the left inguinal area.       Small umb hernia in stalk  Genitourinary:       Exam done with assistance of male Medical Assistant in the  room.  Perianal skin clean with good hygiene.  No pruritis.  No pilonidal disease.  No fissure.  No abscess/fistula.    Tolerates digital rectal exam.  Normal sphincter tone.   No rectal masses.  Prolapsing hemorrhoid R anterior with ext skin tag - not reducible  Other hemorrhoidal piles WNL    Musculoskeletal: Normal range of motion. He exhibits no tenderness.  Lymphadenopathy:    He has no cervical adenopathy.       Right: No inguinal adenopathy present.       Left: No inguinal adenopathy present.  Neurological: He is alert and oriented to person, place, and time. No cranial nerve deficit. He exhibits normal muscle tone. Coordination normal.  Skin: Skin is warm and dry. No rash noted. He is not diaphoretic. No erythema. No pallor.  Psychiatric: He has a normal mood and affect. His behavior is normal. Judgment and thought content normal.       Assessment:     BIH, Umb hernia, prolapsing int hemorrhoid    Plan:     I think that he would benefit from repair on bilateral inguinal hernias. Good laparoscopic candidate. The umbilical hernia small enough that primary repair should be sufficient.  We need to wait until he recovers from his pneumonia. He is followed by Dr. Sherene Sires for this  The anatomy & physiology of the abdominal wall and pelvic floor was discussed.  The pathophysiology of hernias in the inguinal and pelvic region was discussed.  Natural history risks such as progressive enlargement, pain, incarceration & strangulation was discussed.   Contributors to complications such as smoking, obesity, diabetes, prior surgery, etc were discussed.    I feel the risks of no intervention will lead to serious problems that outweigh the operative risks; therefore, I recommended surgery to reduce and repair the hernia.  I explained laparoscopic techniques with possible need for an open approach.  I noted usual use of mesh to patch and/or buttress hernia repair  Risks such as bleeding,  infection, abscess, need for further treatment, heart attack, death, and other risks were discussed.  I noted a good likelihood this will help address the problem.   Goals of post-operative recovery were discussed as well.  Possibility that this will not correct all symptoms was explained.  I stressed the importance of low-impact activity, aggressive pain control, avoiding constipation, & not pushing through pain to minimize risk of post-operative chronic pain or injury. Possibility of reherniation was discussed.  We will work to minimize complications.     An educational handout further explaining the pathology & treatment options was given as well.  Questions were answered.  The patient expresses understanding & wishes to proceed with surgery.  The prolapsed hemorrhoid probably requires removal in the operating room. Do not think it is a good idea to do it  at the same time as the hernia repairs. He wants to try a bowel regimen first and see if it will calm down and reduce on its on. Otherwise consider surgery at a later time:  The anatomy & physiology of the anorectal region was discussed.  The pathophysiology of hemorrhoids and differential diagnosis was discussed.  Natural history risks without surgery was discussed.   I stressed the importance of a bowel regimen to have daily soft bowel movements to minimize progression of disease.  Interventions such as sclerotherapy & banding were discussed.  The patient's symptoms are not adequately controlled by medicines and other non-operative treatments.  I feel the risks & problems of no surgery outweigh the operative risks; therefore, I recommended surgery to treat the hemorrhoids by ligation, pexy, and possible resection.  Risks such as bleeding, infection, need for further treatment, heart attack, death, and other risks were discussed.   I noted a good likelihood this will help address the problem.  Goals of post-operative recovery were discussed as well.   Possibility that this will not correct all symptoms was explained.  Post-operative pain, bleeding, constipation, and other problems after surgery were discussed.  We will work to minimize complications.   Educational handouts further explaining the pathology, treatment options, and bowel regimen were given as well.  Questions were answered.  The patient expresses understanding & wishes to proceed with surgery.  He also would benefit from a screening colonoscopy. He does not know status. We will work to try and help facilitate that

## 2011-11-26 NOTE — Patient Instructions (Signed)
HEMORRHOIDS   The rectum is the last few inches of your colon, and it naturally stretches to hold stool.  Hemorrhoidal piles are natural clusters of blood vessels that help the rectum stretch to hold stool and allow bowel movements to eliminate feces.  Hemorrhoids are abnormally swollen blood vessels in the rectum.  Too much pressure in the rectum causes hemorrhoids by forcing blood to stretch and bulge the walls of the veins, sometimes even rupturing them.  Hemorrhoids can become like varicose veins you might see on a person's legs. When bulging hemorrhoidal veins are irritated, they can swell, burn, itch, become very painful, and bleed. Once the rectal veins have been stretched out and hemorrhoids created, they are difficult to get rid of completely and tend to recur with less straining than it took to cause them in the first place. Fortunately, good habits and simple medical treatment usually control hemorrhoids well, and surgery is only recommended in unusually severe cases. Some of the most frequent causes of hemorrhoids:    Constant sitting    Straining with bowel movements (from constipation or hard stools)    Diarrhea    Sitting on the toilet for a long time    Severe coughing    Childbirth    Heavy Lifting  Types of Hemorrhoids:    Internal hemorrhoids usually don't hurt or itch; they are deep inside the rectum and usually have no sensation. However, internal hemorrhoids can bleed.  Such bleeding should not be ignored and mask blood from a dangerous source like colorectal cancer, so persistent rectal bleeding should be investigated with a colonoscopy.    External hemorrhoids cause most of the symptoms - pain, burning, and itching. Unirritated hemorrhoids can look like small skin tags coming out of the anus.     Thrombosed hemorrhoids can form when a hemorrhoid blood vessel bursts and causes the hemorrhoid to swell.  A purple blood clot can form in it and become an excruciatingly painful lump  at the anus. Because of these unpleasant symptoms, immediate incision and drainage by a surgeon at an office visit can provide much relief of the pain.    PREVENTION Avoiding the causes listed in above will prevent most cases of hemorrhoids, but this advice is sometimes hard to follow:  How can you avoid sitting all day if you have a seated job? Also, we try to avoid coughing and diarrhea, but sometimes it's beyond your control.  Still, there are some practical hints to help:    If your main job activity is seated, always stand or walk during your breaks. Make it a point to stand and walk at least 5 minutes every hour and try to shift frequently in your chair to avoid direct rectal pressure.    Always exhale as you strain or lift. Don't hold your breath.    Treat coughing, diarrhea and constipation early since irritated hemorrhoids may soon follow.    Do not delay or try to prevent a bowel movement when the urge is present.   Exercise regularly (walking or jogging 60 minutes a day) to stimulate the bowels to move.   Avoid dry toilet paper when cleaning after bowel movements.  Moistened tissues such as baby wipes are less irritating.  Lightly pat the rectal area dry.  Using irrigating showers or bottle irrigation washing can more gently clean this sensitive area.   Keep the anal and genital area clean and  dry.  Talcum or baby powders can help   GET   YOUR STOOLS SOFT.   This is the most important way to prevent irritated hemorrhoids.  Hard stools are like sandpaper to the anorectal canal and will cause more problems.   The goal: ONE SOFT BOWEL MOVEMENT A DAY!  To have soft, regular bowel movements:    Drink at least 8 tall glasses of water a day.     AVOID CONSTIPATION    Take plenty of fiber.  Fiber is the undigested part of plant food that passes into the colon, acting s "natures broom" to encourage bowel motility and movement.  Fiber can absorb and hold large amounts of water. This results in a  larger, bulkier stool, which is soft and easier to pass. Work gradually over several weeks up to 6 servings a day of fiber (25g a day even more if needed) in the form of: o Vegetables -- Root (potatoes, carrots, turnips), leafy green (lettuce, salad greens, celery, spinach), or cooked high residue (cabbage, broccoli, etc) o Fruit -- Fresh (unpeeled skin & pulp), Dried (prunes, apricots, cherries, etc ),  or stewed ( applesauce)  o Whole grain breads, pasta, etc (whole wheat)  o Bran cereals    Bulking Agents -- This type of water-retaining fiber generally is easily obtained each day by one of the following:  o Psyllium bran -- The psyllium plant is remarkable because its ground seeds can retain so much water. This product is available as Metamucil, Konsyl, Effersyllium, Per Diem Fiber, or the less expensive generic preparation in drug and health food stores. Although labeled a laxative, it really is not a laxative.  o Methylcellulose -- This is another fiber derived from wood which also retains water. It is available as Citrucel. o Polyethylene Glycol - and "artificial" fiber commonly called Miralax or Glycolax.  It is helpful for people with gassy or bloated feelings with regular fiber o Flax Seed - a less gassy fiber than psyllium   No reading or other relaxing activity while on the toilet. If bowel movements take longer than 5 minutes, you are too constipated   Laxatives can be useful for a short period if constipation is severe o Osmotics (Milk of Magnesia, Fleets phosphosoda, Magnesium citrate, MiraLax, GoLytely) are safer than  o Stimulants (Senokot, Castor Oil, Dulcolax, Ex Lax)    o Do not take laxatives for more than 7days in a row.   Laxatives are not a good long-term solution as it can stress the intestine and colon and causes too much mineral and fluid losses.    If badly constipated, try a Bowel Retraining Program: o Do not use laxatives.  o Eat a diet high in roughage, such as bran  cereals and leafy vegetables.  o Drink six (6) ounces of prune or apricot juice each morning.  o Eat two (2) large servings of stewed fruit each day.  o Take one (1) heaping dose of a bulking agent (ex. Metamucil, Citrucel, Miralax) twice a day.  o Use sugar-free sweetener when possible to avoid excessive calories.  o Eat a normal breakfast.  o Set aside 15 minutes after breakfast to sit on the toilet, but do not strain to have a bowel movement.  o If you do not have a bowel movement by the third day, use an enema and repeat the above steps.    AVOID DIARRHEA o Switch to liquids and simpler foods for a few days to avoid stressing your intestines further. o Avoid dairy products (especially milk & ice cream) for a short   time.  The intestines often can lose the ability to digest lactose when stressed. o Avoid foods that cause gassiness or bloating.  Typical foods include beans and other legumes, cabbage, broccoli, and dairy foods.  Every person has some sensitivity to other foods, so listen to our body and avoid those foods that trigger problems for you. o Adding fiber (Citrucel, Metamucil, psyllium, Miralax) gradually can help thicken stools by absorbing excess fluid and retrain the intestines to act more normally.  Slowly increase the dose over a few weeks.  Too much fiber too soon can backfire and cause cramping & bloating. o Probiotics (such as active yogurt, Align, etc) may help repopulate the intestines and colon with normal bacteria and calm down a sensitive digestive tract.  Most studies show it to be of mild help, though, and such products can be costly. o Medicines:   Bismuth subsalicylate (ex. Kayopectate, Pepto Bismol) every 30 minutes for up to 6 doses can help control diarrhea.  Avoid if pregnant.   Loperamide (Immodium) can slow down diarrhea.  Start with two tablets (4mg  total) first and then try one tablet every 6 hours.  Avoid if you are having fevers or severe pain.  If you are not  better or start feeling worse, stop all medicines and call your doctor for advice o Call your doctor if you are getting worse or not better.  Sometimes further testing (cultures, endoscopy, X-ray studies, bloodwork, etc) may be needed to help diagnose and treat the cause of the diarrhea.   If these preventive measures fail, you must take action right away! Hemorrhoids are one condition that can be mild in the morning and become intolerable by nightfall.     Hernia, Surgical Repair A hernia occurs when an internal organ pushes out through a weak spot in the belly (abdominal) wall muscles. Hernias commonly occur in the groin and around the navel. Hernias often can be pushed back into place (reduced). Most hernias tend to get worse over time. Problems occur when abdominal contents get stuck in the opening (incarcerated hernia). The blood supply gets cut off (strangulated hernia). This is an emergency and needs surgery. Otherwise, hernia repair can be an elective procedure. This means you can schedule this at your convenience when an emergency is not present. Because complications can occur, if you decide to repair the hernia, it is best to do it soon. When it becomes an emergency procedure, there is increased risk of complications after surgery. CAUSES   Heavy lifting.   Obesity.   Prolonged coughing.   Straining to move your bowels.   Hernias can also occur through a cut (incision) by a surgeonafter an abdominal operation.  HOME CARE INSTRUCTIONS Before the repair:  Bed rest is not required. You may continue your normal activities, but avoid heavy lifting (more than 10 pounds) or straining. Cough gently. If you are a smoker, it is best to stop. Even the best hernia repair can break down with the continual strain of coughing.   Do not wear anything tight over your hernia. Do not try to keep it in with an outside bandage or truss. These can damage abdominal contents if they are trapped in the  hernia sac.   Eat a normal diet. Avoid constipation. Straining over long periods of time to have a bowel movement will increase hernia size. It also can breakdown repairs. If you cannot do this with diet alone, laxatives or stool softeners may be used.  PRIOR TO SURGERY, SEEK  IMMEDIATE MEDICAL CARE IF: You have problems (symptoms) of a trapped (incarcerated) hernia. Symptoms include:  An oral temperature above 102 F (38.9 C) develops, or as your caregiver suggests.   Increasing abdominal pain.   Feeling sick to your stomach(nausea) and vomiting.   You stop passing gas or stool.   The hernia is stuck outside the abdomen, looks discolored, feels hard, or is tender.   You have any changes in your bowel habits or in the hernia that is unusual for you.  LET YOUR CAREGIVERS KNOW ABOUT THE FOLLOWING:  Allergies.   Medications taken including herbs, eye drops, over the counter medications, and creams.   Use of steroids (by mouth or creams).   Family or personal history of problems with anesthetics or Novocaine.   Possibility of pregnancy, if this applies.   Personal history of blood clots (thrombophlebitis).   Family or personal history of bleeding or blood problems.   Previous surgery.   Other health problems.  BEFORE THE PROCEDURE You should be present 1 hour prior to your procedure, or as directed by your caregiver.  AFTER THE PROCEDURE After surgery, you will be taken to the recovery area. A nurse will watch and check your progress there. Once you are awake, stable, and taking fluids well, you will be allowed to go home as long as there are no problems. Once home, an ice pack (wrapped in a light towel) applied to your operative site may help with discomfort. It may also keep the swelling down. Do not lift anything heavier than 10 pounds (4.55 kilograms). Take showers not baths. Do not drive while taking narcotics. Follow instructions as suggested by your caregiver.  SEEK  IMMEDIATE MEDICAL CARE IF: After surgery:  There is redness, swelling, or increasing pain in the wound.   There is pus coming from the wound.   There is drainage from a wound lasting longer than 1 day.   An unexplained oral temperature above 102 F (38.9 C) develops.   You notice a foul smell coming from the wound or dressing.   There is a breaking open of a wound (edged not staying together) after the sutures have been removed.   You notice increasing pain in the shoulders (shoulder strap areas).   You develop dizzy episodes or fainting while standing.   You develop persistent nausea or vomiting.   You develop a rash.   You have difficulty breathing.   You develop any reaction or side effects to medications given.  MAKE SURE YOU:   Understand these instructions.   Will watch your condition.   Will get help right away if you are not doing well or get worse.  Document Released: 12/04/2000 Document Revised: 05/30/2011 Document Reviewed: 10/27/2007 [facility] Patient Information 2012 [place], [place].

## 2011-11-29 ENCOUNTER — Telehealth: Payer: Self-pay | Admitting: *Deleted

## 2011-11-29 NOTE — Telephone Encounter (Signed)
Pt is scheduled with MW for f/u on 12-13-11. I called the pt to remind him of this. He verbalized understanding and states will keep appt.

## 2011-11-29 NOTE — Telephone Encounter (Signed)
Message copied by Christen Butter on Fri Nov 29, 2011 11:58 AM ------      Message from: Nyoka Cowden      Created: Fri Nov 15, 2011 10:25 AM       Make sure he's had a f/u cxr by now

## 2011-12-06 ENCOUNTER — Ambulatory Visit (AMBULATORY_SURGERY_CENTER): Payer: Self-pay | Admitting: *Deleted

## 2011-12-06 VITALS — Ht 67.0 in | Wt 155.0 lb

## 2011-12-06 DIAGNOSIS — Z1211 Encounter for screening for malignant neoplasm of colon: Secondary | ICD-10-CM

## 2011-12-06 MED ORDER — MOVIPREP 100 G PO SOLR: ORAL | Status: DC

## 2011-12-12 ENCOUNTER — Other Ambulatory Visit: Payer: Self-pay | Admitting: Internal Medicine

## 2011-12-12 DIAGNOSIS — J189 Pneumonia, unspecified organism: Secondary | ICD-10-CM

## 2011-12-13 ENCOUNTER — Encounter: Payer: Self-pay | Admitting: Internal Medicine

## 2011-12-13 ENCOUNTER — Telehealth: Payer: Self-pay | Admitting: Internal Medicine

## 2011-12-13 NOTE — Progress Notes (Signed)
 This encounter was created in error - please disregard.

## 2011-12-13 NOTE — Telephone Encounter (Signed)
I spoke with pt and he is needing to see MW for surgical clearance. He is going to have hernia surgery 12/31/11 by Dr. Michaell Cowing. MW is it okay to overbook you, please advise thanks

## 2011-12-13 NOTE — Telephone Encounter (Signed)
Ok to overbook.

## 2011-12-13 NOTE — Telephone Encounter (Signed)
i spoke with pt and he is coming in 12/16/11

## 2011-12-16 ENCOUNTER — Ambulatory Visit (INDEPENDENT_AMBULATORY_CARE_PROVIDER_SITE_OTHER)
Admission: RE | Admit: 2011-12-16 | Discharge: 2011-12-16 | Disposition: A | Discharge status: Home / Self Care | Payer: Self-pay | Source: Ambulatory Visit | Attending: Internal Medicine | Admitting: Internal Medicine

## 2011-12-16 ENCOUNTER — Ambulatory Visit (INDEPENDENT_AMBULATORY_CARE_PROVIDER_SITE_OTHER): Payer: Self-pay | Admitting: Internal Medicine

## 2011-12-16 ENCOUNTER — Encounter: Payer: Self-pay | Admitting: Internal Medicine

## 2011-12-16 VITALS — BP 122/84 | HR 94 | Temp 98.2°F | Ht 67.0 in | Wt 158.8 lb

## 2011-12-16 DIAGNOSIS — J189 Pneumonia, unspecified organism: Secondary | ICD-10-CM

## 2011-12-16 DIAGNOSIS — I10 Essential (primary) hypertension: Secondary | ICD-10-CM | POA: Insufficient documentation

## 2011-12-16 DIAGNOSIS — F17200 Nicotine dependence, unspecified, uncomplicated: Secondary | ICD-10-CM

## 2011-12-16 MED ORDER — OLMESARTAN MEDOXOMIL 20 MG PO TABS: 20.0000 mg | ORAL_TABLET | Freq: Every day | ORAL | Status: DC

## 2011-12-16 NOTE — Assessment & Plan Note (Signed)
Chest x-ray showed a patchy airspace disease in the right upper lobe. Subsequent CT scan showed inferior right upper lobe consolidation with small cystic areas within the consolidation that are compatible with a necrotizing pneumonia. No empyema or pulmonary emboli. >neg  HIV >PPD neg  >tx w/ 10 days Avelox 11/09/11 - adequately cleared 12/16/2011 with min residual pleural scarring  No further f/u needed, cleared for hernia surgery and colonoscopy

## 2011-12-16 NOTE — Progress Notes (Signed)
Subjective:    Patient ID: Levi Conner, male    DOB: 1956-02-03   MRN: [id number]  HPI 56 year old male smoker seen for initial pulmonary consult during hospital admission, 10/30/2011 for necrotizing pneumonia  11/04/2011 post hospital followup. Patient returns for a post hospital followup. Patient was admitted May 8 to 10/31/2011 for a necrotizing pneumonia. Prior to admission patient was treated for a pneumonia with a Z-Pak without any improvement. Chest x-ray showed a patchy airspace disease in the right upper lobe. Subsequent CT scan showed inferior right upper lobe consolidation with small cystic areas within the consolidation that are compatible with a necrotizing pneumonia. No empyema or pulmonary emboli. Workup showed a normal white blood cell till negative, HIV. A PPD was neg  Patient was discharged on a ten-day course of Avelox. Since discharge. Patient is feeling better , but still weak with intermittent shortness of breath. Cough is decreased . No hemoptysis.  CXR today with little change .  Has few days left on avelox . rec Finish Avelox as directed.  Mucinex DM Twice daily  As needed  Cough/congestion  Advance activity as tolerated.  Follow up in office in 2-3  weeks with chest xray - with first available doctor .  MOST IMPORTANT GOAL IS TO QUIT SMOKING   11/15/2011 f/u ov/Levi Conner still smoking but completely back to baseline doe and min am cough. No obvious daytime variabilty  no cp or chest tightness, subjective wheeze overt sinus or hb symptoms. No unusual exp hx  rec Return 3rd week in June for final follow up cxr to establish complete clearing of the abnormalities from your pneumonia You need to completely quit smoking before surgery if possible  12/16/2011 f/u ov/Levi Conner cut way back on smoking on ACEI cc daily cough> sev tbsps  clear mucus worse each am, needing surgical clearance for L groin by Dr Levi Conner. No limiting sob, no overt sinus or hb symptoms.  Sleeping ok  without nocturnal  or early am exacerbation  of respiratory  c/o's or need for noct saba. Also denies any obvious fluctuation of symptoms with weather or environmental changes or other aggravating or alleviating factors except as outlined above    ROS  At present no c/o  any significant sore throat, dysphagia, dental problems, itching, sneezing,  nasal congestion or excess/ purulent secretions, ear ache,   fever, chills, sweats, unintended wt loss, pleuritic or exertional cp, hemoptysis, palpitations, orthopnea pnd or leg swelling.  Also denies presyncope, palpitations, heartburn, anorexia, nausea, vomiting, diarrhea  or change in bowel or urinary habits, change in stools or urine, dysuria,hematuria,  rash, arthralgias, visual complaints, headache, numbness weakness or ataxia or problems with walking or coordination. No noted change in mood/affect or memory.           Objective:   Physical Exam  Wt Readings from Last 3 Encounters:  12/16/11 158 lb 12.8 oz (72.031 kg)  12/06/11 155 lb (70.308 kg)  11/26/11 150 lb 9.6 oz (68.312 kg)    GEN: A/Ox3; pleasant , NAD, well nourished   HEENT:  [place]/AT,  EACs-clear, TMs-wnl, NOSE-clear, THROAT-clear, no lesions, no postnasal drip or exudate noted.   NECK:  Supple w/ fair ROM; no JVD; normal carotid impulses w/o bruits; no thyromegaly or nodules palpated; no lymphadenopathy.  RESP  slt exp rhonchi ? slt  worse on R, no accessory muscle use, no dullness to percussion  CARD:  RRR, no m/r/g  , no peripheral edema, pulses intact, no cyanosis or clubbing.  GI:  Soft & nt; nml bowel sounds; no organomegaly or masses detected.  Musco: Warm bil, no deformities or joint swelling noted.   Neuro: alert, no focal deficits noted.    Skin: Warm, no lesions or rashes     CXR  12/16/2011 :  Right upper lobe air space consolidation is much improved from baseline examination of 10/25/2011. Residual subpleural linear densities may be due to  scarring        Assessment & Plan:

## 2011-12-16 NOTE — Assessment & Plan Note (Signed)
Changed lisinopril to arb 12/16/2011 due to cough, trial basis  rx benicar 20 mg daily x one month samples, f/u Dr Parke Simmers re whether worth changing permanently from acei to arb or another alternative

## 2011-12-16 NOTE — Patient Instructions (Addendum)
Stop lisinopril and start benicar 20 mg one daily in its place - if too strong just take a half daily  If your cough goes away by the time you finish the samples Dr Parke Simmers will need to consider continuing this or another non-lisinopril alternative  Stopping smoking is the most important aspect of your longterm lung health - pulmonary f/u is as needed at this point

## 2011-12-17 ENCOUNTER — Encounter (HOSPITAL_COMMUNITY): Payer: Self-pay | Admitting: Pharmacy Technician

## 2011-12-19 ENCOUNTER — Encounter: Payer: Self-pay | Admitting: Internal Medicine

## 2011-12-19 ENCOUNTER — Ambulatory Visit (AMBULATORY_SURGERY_CENTER): Payer: Self-pay | Admitting: Internal Medicine

## 2011-12-19 VITALS — BP 99/71 | HR 98 | Temp 97.7°F | Resp 22 | Ht 67.0 in | Wt 155.0 lb

## 2011-12-19 DIAGNOSIS — D126 Benign neoplasm of colon, unspecified: Secondary | ICD-10-CM

## 2011-12-19 DIAGNOSIS — Z1211 Encounter for screening for malignant neoplasm of colon: Secondary | ICD-10-CM

## 2011-12-19 MED ORDER — SODIUM CHLORIDE 0.9 % IV SOLN: 500.0000 mL | INTRAVENOUS | Status: DC

## 2011-12-19 NOTE — Patient Instructions (Addendum)

## 2011-12-19 NOTE — Progress Notes (Signed)
Patient did not experience any of the following events: a burn prior to discharge; a fall within the facility; wrong site/side/patient/procedure/implant event; or a hospital transfer or hospital admission upon discharge from the facility. (G8907) Patient did not have preoperative order for IV antibiotic SSI prophylaxis. (G8918)  

## 2011-12-19 NOTE — Op Note (Signed)
[facility] [address] [place], [place]  [address]  COLONOSCOPY PROCEDURE REPORT  PATIENT:  Levi Conner, Levi Conner  MR#:  [id number] BIRTHDATE:  Jun 11, 1956, 55 yrs. old  GENDER:  male ENDOSCOPIST:  Wilhemina Bonito. Eda Keys, MD REF. BY:  Renaye Rakers, M.D. PROCEDURE DATE:  12/19/2011 PROCEDURE:  Colonoscopy with snare polypectomy x 6 ASA CLASS:  Class II INDICATIONS:  Routine Risk Screening MEDICATIONS:   MAC sedation, administered by CRNA, propofol (Diprivan) 400 mg IV  DESCRIPTION OF PROCEDURE:   After the risks benefits and alternatives of the procedure were thoroughly explained, informed consent was obtained.  Digital rectal exam was performed and revealed no abnormalities.   The LB CF-[id number] endoscope was introduced through the anus and advanced to the cecum, which was identified by both the appendix and ileocecal valve, without limitations.  The quality of the prep was excellent, using MoviPrep.  The instrument was then slowly withdrawn as the colon was fully examined. <<PROCEDUREIMAGES>>  FINDINGS:  There were multiple polyps identified and removed - 2,4,62mm in the cecum; 3,99mm in ascending and 5mm in transverse colon. Polyps were snared without cautery. Retrieval was successful. Otherwise normal colonoscopy without other polyps, masses, vascular ectasias, or inflammatory changes.   Retroflexed views in the rectum revealed no abnormalities.    The time to cecum = 1:57  minutes. The scope was then withdrawn in  15:17 minutes from the cecum and the procedure completed.  COMPLICATIONS:  None  ENDOSCOPIC IMPRESSION: 1) Polyps, multiple in the colon - removed 2) Otherwise normal colonoscopy  RECOMMENDATIONS: 1) Repeat Colonoscopy in 3 years.  ______________________________ Wilhemina Bonito. Eda Keys, MD  CC:  Renaye Rakers, MD; The Patient  n. eSIGNED:   Wilhemina Bonito. Eda Keys at 12/19/2011 09:25 AM  Mancel Bale, [phone]

## 2011-12-20 ENCOUNTER — Telehealth: Payer: Self-pay | Admitting: *Deleted

## 2011-12-20 NOTE — Telephone Encounter (Signed)
  Follow up Call-  Call back number 12/19/2011  Post procedure Call Back phone  # [phone]  Permission to leave phone message Yes     Patient questions:  Do you have a fever, pain , or abdominal swelling? no Pain Score  0 *  Have you tolerated food without any problems? yes  Have you been able to return to your normal activities? yes  Do you have any questions about your discharge instructions: Diet   no Medications  no Follow up visit  no  Do you have questions or concerns about your Care? no  Actions: * If pain score is 4 or above: No action needed, pain <4.

## 2011-12-24 ENCOUNTER — Encounter: Payer: Self-pay | Admitting: Internal Medicine

## 2011-12-30 ENCOUNTER — Encounter (HOSPITAL_COMMUNITY): Payer: Self-pay

## 2011-12-30 ENCOUNTER — Encounter (HOSPITAL_COMMUNITY)
Admission: RE | Admit: 2011-12-30 | Discharge: 2011-12-30 | Disposition: A | Discharge status: Home / Self Care | Payer: Self-pay | Source: Ambulatory Visit | Attending: Surgery | Admitting: Surgery

## 2011-12-30 ENCOUNTER — Telehealth (INDEPENDENT_AMBULATORY_CARE_PROVIDER_SITE_OTHER): Payer: Self-pay

## 2011-12-30 DIAGNOSIS — K4090 Unilateral inguinal hernia, without obstruction or gangrene, not specified as recurrent: Secondary | ICD-10-CM | POA: Insufficient documentation

## 2011-12-30 DIAGNOSIS — R21 Rash and other nonspecific skin eruption: Secondary | ICD-10-CM | POA: Insufficient documentation

## 2011-12-30 DIAGNOSIS — K429 Umbilical hernia without obstruction or gangrene: Secondary | ICD-10-CM | POA: Insufficient documentation

## 2011-12-30 HISTORY — DX: Rash and other nonspecific skin eruption: R21

## 2011-12-30 NOTE — Patient Instructions (Addendum)
20 Levi Conner  12/30/2011   Your procedure is scheduled on:  12-31-2011  Report to [facility] at 0530  AM.  Call this number if you have problems the morning of surgery: [phone]   Remember:   Do not eat food or drink liquids:After Midnight.  .  Take these medicines the morning of surgery with A SIP OF WATER: none   Do not wear jewelry or make up.  Do not wear lotions, powders, or perfumes.Do not wear deodorant.    Do not bring valuables to the hospital.  Contacts, dentures or bridgework may not be worn into surgery.  Leave suitcase in the car. After surgery it may be brought to your room.  For patients admitted to the hospital, checkout time is 11:00 AM the day of                         discharge.     Special Instructions: CHG Shower Use Special Wash: 1/2 bottle night before surgery and 1/2 bottle morning of surgery, use regular soap on face and front and back private area.   Please read over the following fact sheets that you were given: MRSA Information  [facility] pre op nurse phone number [phone], call if needed

## 2011-12-30 NOTE — Telephone Encounter (Signed)
The nurse Jasmine December called from preadmitting and the pt reports that he noticed at 4am he has a red bumpy rash at his beltline , umbilicus and both feet.  He did some yard work and there is a question of poison ivy or oak.  I paged Dr Michaell Cowing and he advised postponing the surgery and have the pt see his medical md and get this cleared up before he proceeds.  He wants to have Alisha call and check on the pt next week and see how he is doing.  Otherwise he would put it out a month.  I notified Debbie in scheduling to cancel for now.  I called Jasmine December back and she will inform the pt and hold on his testing.

## 2011-12-30 NOTE — Progress Notes (Signed)
12/30/11 1115  OBSTRUCTIVE SLEEP APNEA  Have you ever been diagnosed with sleep apnea through a sleep study? No  Do you snore loudly (loud enough to be heard through closed doors)?  1  Do you often feel tired, fatigued, or sleepy during the daytime? 0  Has anyone observed you stop breathing during your sleep? 0  Do you have, or are you being treated for high blood pressure? 1  BMI more than 35 kg/m2? 0  Age over 56 years old? 1  Gender: 1  Obstructive Sleep Apnea Score 4   Score 4 or greater  Updated health history;Results sent to PCP

## 2011-12-30 NOTE — Progress Notes (Signed)
Called and spoke with sarah glaspey rn, and made aware pt with red rash around belly button and beltline and both feet, dr gross said to postpone surgery for 30 days and pt to see dr bland for rash evaluation and treatment, pt made aware to see dr bland and surgery to be postponed.

## 2011-12-31 ENCOUNTER — Ambulatory Visit (HOSPITAL_COMMUNITY): Admission: RE | Admit: 2011-12-31 | Payer: Self-pay | Source: Ambulatory Visit | Admitting: Surgery

## 2011-12-31 ENCOUNTER — Encounter (HOSPITAL_COMMUNITY): Admission: RE | Payer: Self-pay | Source: Ambulatory Visit

## 2011-12-31 SURGERY — LAPAROSCOPIC INGUINAL HERNIA WITH UMBILICAL HERNIA
Anesthesia: General | Laterality: Bilateral

## 2012-01-09 ENCOUNTER — Encounter (INDEPENDENT_AMBULATORY_CARE_PROVIDER_SITE_OTHER): Payer: Self-pay

## 2012-01-15 ENCOUNTER — Encounter (INDEPENDENT_AMBULATORY_CARE_PROVIDER_SITE_OTHER): Payer: PRIVATE HEALTH INSURANCE | Admitting: Surgery

## 2012-01-16 ENCOUNTER — Encounter (HOSPITAL_COMMUNITY): Payer: Self-pay | Admitting: *Deleted

## 2012-01-16 NOTE — Pre-Procedure Instructions (Signed)
PT REQUESTED [facility]-PREOP INSTRUCTIONS DISCUSSED WITH PT BY PHONE USING TEACH BACK METHOD.  INSTRUCTED TO SHOWER NIGHT BEFORE SURGERY AND AM OF SURGERY WITH BETASEPT SOAP-BUT NOT TO USE ON FACE OR PRIVATE AREAS.

## 2012-01-24 ENCOUNTER — Encounter (HOSPITAL_COMMUNITY): Payer: Self-pay | Admitting: Pharmacy Technician

## 2012-01-29 ENCOUNTER — Other Ambulatory Visit (INDEPENDENT_AMBULATORY_CARE_PROVIDER_SITE_OTHER): Payer: Self-pay | Admitting: Surgery

## 2012-01-29 ENCOUNTER — Telehealth (INDEPENDENT_AMBULATORY_CARE_PROVIDER_SITE_OTHER): Payer: Self-pay | Admitting: General Surgery

## 2012-01-29 MED ORDER — GENTAMICIN SULFATE 40 MG/ML IJ SOLN
350.0000 mg | INTRAVENOUS | Status: AC
  Administered 2012-01-30: 350 mg via INTRAVENOUS
  Filled 2012-01-29: qty 8.75

## 2012-01-29 NOTE — Telephone Encounter (Signed)
Pt called to report that he has intermittent coughing/ like " tickle in throat" Dr. told him it may be due to lisinopril. He is to see pulmonary dr. and have medication changed soon. He did not know if this cough would affect surgery. Dr. Michaell Cowing notified/ he said anesthesia would check pt tomorrow before surgery. Pt notified/gy

## 2012-01-29 NOTE — Telephone Encounter (Signed)
Clydie Braun from [facility] called saying that this patient doesn't have a diagnosis for his consent forms for surgery.  Informed her I would route the message to Dr. Michaell Cowing.

## 2012-01-30 ENCOUNTER — Encounter (HOSPITAL_COMMUNITY): Payer: Self-pay

## 2012-01-30 ENCOUNTER — Encounter (HOSPITAL_COMMUNITY): Payer: Self-pay | Admitting: Anesthesiology

## 2012-01-30 ENCOUNTER — Ambulatory Visit (HOSPITAL_COMMUNITY)
Admission: RE | Admit: 2012-01-30 | Discharge: 2012-01-30 | Disposition: A | Discharge status: Home / Self Care | Payer: Self-pay | Source: Ambulatory Visit | Attending: Surgery | Admitting: Surgery

## 2012-01-30 ENCOUNTER — Ambulatory Visit (HOSPITAL_COMMUNITY): Payer: Self-pay | Admitting: Anesthesiology

## 2012-01-30 ENCOUNTER — Encounter (HOSPITAL_COMMUNITY)
Admission: RE | Disposition: A | Discharge status: Home / Self Care | Payer: Self-pay | Source: Ambulatory Visit | Attending: Surgery

## 2012-01-30 DIAGNOSIS — F17200 Nicotine dependence, unspecified, uncomplicated: Secondary | ICD-10-CM | POA: Insufficient documentation

## 2012-01-30 DIAGNOSIS — I10 Essential (primary) hypertension: Secondary | ICD-10-CM | POA: Insufficient documentation

## 2012-01-30 DIAGNOSIS — E119 Type 2 diabetes mellitus without complications: Secondary | ICD-10-CM | POA: Insufficient documentation

## 2012-01-30 DIAGNOSIS — K429 Umbilical hernia without obstruction or gangrene: Secondary | ICD-10-CM | POA: Insufficient documentation

## 2012-01-30 DIAGNOSIS — K4020 Bilateral inguinal hernia, without obstruction or gangrene, not specified as recurrent: Secondary | ICD-10-CM | POA: Insufficient documentation

## 2012-01-30 DIAGNOSIS — Z8701 Personal history of pneumonia (recurrent): Secondary | ICD-10-CM | POA: Insufficient documentation

## 2012-01-30 HISTORY — PX: HERNIA REPAIR: SHX51

## 2012-01-30 HISTORY — PX: INGUINAL HERNIA REPAIR: SHX194

## 2012-01-30 HISTORY — PX: UMBILICAL HERNIA REPAIR: SHX196

## 2012-01-30 LAB — CBC
MCH: 35.1 pg — ABNORMAL HIGH (ref 26.0–34.0)
MCHC: 36.7 g/dL — ABNORMAL HIGH (ref 30.0–36.0)
MCV: 95.8 fL (ref 78.0–100.0)
Platelets: 154 10*3/uL (ref 150–400)
RBC: 4.78 MIL/uL (ref 4.22–5.81)

## 2012-01-30 LAB — GLUCOSE, CAPILLARY: Glucose-Capillary: 120 mg/dL — ABNORMAL HIGH (ref 70–99)

## 2012-01-30 LAB — BASIC METABOLIC PANEL
CO2: 25 mEq/L (ref 19–32)
Calcium: 9.6 mg/dL (ref 8.4–10.5)
Creatinine, Ser: 0.99 mg/dL (ref 0.50–1.35)

## 2012-01-30 SURGERY — REPAIR, HERNIA, INGUINAL, BILATERAL, LAPAROSCOPIC
Anesthesia: General | Site: Groin | Wound class: Clean

## 2012-01-30 MED ORDER — ACETAMINOPHEN 650 MG RE SUPP
650.0000 mg | RECTAL | Status: DC | PRN
  Filled 2012-01-30: qty 1

## 2012-01-30 MED ORDER — CLINDAMYCIN PHOSPHATE 900 MG/50ML IV SOLN
900.0000 mg | INTRAVENOUS | Status: AC
  Administered 2012-01-30: 900 mg via INTRAVENOUS
  Filled 2012-01-30: qty 50

## 2012-01-30 MED ORDER — PROPOFOL 10 MG/ML IV EMUL
INTRAVENOUS | Status: DC | PRN
  Administered 2012-01-30: 200 mg via INTRAVENOUS

## 2012-01-30 MED ORDER — ONDANSETRON HCL 4 MG/2ML IJ SOLN: 4.0000 mg | Freq: Four times a day (QID) | INTRAMUSCULAR | Status: DC | PRN

## 2012-01-30 MED ORDER — MIDAZOLAM HCL 5 MG/5ML IJ SOLN
INTRAMUSCULAR | Status: DC | PRN
  Administered 2012-01-30: 2 mg via INTRAVENOUS

## 2012-01-30 MED ORDER — CLINDAMYCIN PHOSPHATE 900 MG/50ML IV SOLN
INTRAVENOUS | Status: AC
  Filled 2012-01-30: qty 50

## 2012-01-30 MED ORDER — SODIUM CHLORIDE 0.9 % IJ SOLN: 3.0000 mL | Freq: Two times a day (BID) | INTRAMUSCULAR | Status: DC

## 2012-01-30 MED ORDER — HYDROMORPHONE HCL PF 1 MG/ML IJ SOLN
INTRAMUSCULAR | Status: DC | PRN
  Administered 2012-01-30 (×2): 1 mg via INTRAVENOUS

## 2012-01-30 MED ORDER — BUPIVACAINE-EPINEPHRINE 0.25% -1:200000 IJ SOLN
INTRAMUSCULAR | Status: DC | PRN
  Administered 2012-01-30: 50 mL

## 2012-01-30 MED ORDER — OXYCODONE HCL 5 MG PO TABS: 5.0000 mg | ORAL_TABLET | ORAL | Status: AC | PRN

## 2012-01-30 MED ORDER — SODIUM CHLORIDE 0.9 % IV SOLN: 250.0000 mL | INTRAVENOUS | Status: DC | PRN

## 2012-01-30 MED ORDER — LIDOCAINE HCL (CARDIAC) 20 MG/ML IV SOLN
INTRAVENOUS | Status: DC | PRN
  Administered 2012-01-30: 100 mg via INTRAVENOUS

## 2012-01-30 MED ORDER — FENTANYL CITRATE 0.05 MG/ML IJ SOLN
25.0000 ug | INTRAMUSCULAR | Status: DC | PRN
  Administered 2012-01-30 (×2): 25 ug via INTRAVENOUS

## 2012-01-30 MED ORDER — PROMETHAZINE HCL 25 MG/ML IJ SOLN: 12.5000 mg | Freq: Four times a day (QID) | INTRAMUSCULAR | Status: DC | PRN

## 2012-01-30 MED ORDER — GLYCOPYRROLATE 0.2 MG/ML IJ SOLN
INTRAMUSCULAR | Status: DC | PRN
  Administered 2012-01-30: 0.6 mg via INTRAVENOUS

## 2012-01-30 MED ORDER — 0.9 % SODIUM CHLORIDE (POUR BTL) OPTIME
TOPICAL | Status: DC | PRN
  Administered 2012-01-30: 1000 mL

## 2012-01-30 MED ORDER — MAGIC MOUTHWASH
15.0000 mL | Freq: Four times a day (QID) | ORAL | Status: DC | PRN
  Filled 2012-01-30: qty 15

## 2012-01-30 MED ORDER — PROMETHAZINE HCL 25 MG/ML IJ SOLN: 6.2500 mg | INTRAMUSCULAR | Status: DC | PRN

## 2012-01-30 MED ORDER — LACTATED RINGERS IV SOLN: INTRAVENOUS | Status: DC

## 2012-01-30 MED ORDER — FENTANYL CITRATE 0.05 MG/ML IJ SOLN
INTRAMUSCULAR | Status: DC | PRN
  Administered 2012-01-30: 100 ug via INTRAVENOUS
  Administered 2012-01-30 (×3): 50 ug via INTRAVENOUS

## 2012-01-30 MED ORDER — MAGNESIUM HYDROXIDE 400 MG/5ML PO SUSP
30.0000 mL | Freq: Two times a day (BID) | ORAL | Status: DC | PRN
  Filled 2012-01-30: qty 30

## 2012-01-30 MED ORDER — ADULT MULTIVITAMIN W/MINERALS CH
1.0000 | ORAL_TABLET | Freq: Every day | ORAL | Status: DC
  Filled 2012-01-30: qty 1

## 2012-01-30 MED ORDER — LACTATED RINGERS IR SOLN
Status: DC | PRN
  Administered 2012-01-30: 1000 mL

## 2012-01-30 MED ORDER — FENTANYL CITRATE 0.05 MG/ML IJ SOLN: 25.0000 ug | INTRAMUSCULAR | Status: DC | PRN

## 2012-01-30 MED ORDER — MUPIROCIN 2 % EX OINT
TOPICAL_OINTMENT | Freq: Once | CUTANEOUS | Status: DC
  Filled 2012-01-30: qty 22

## 2012-01-30 MED ORDER — PHENYLEPHRINE HCL 10 MG/ML IJ SOLN
INTRAMUSCULAR | Status: DC | PRN
  Administered 2012-01-30: 40 ug via INTRAVENOUS

## 2012-01-30 MED ORDER — LACTATED RINGERS IV SOLN
INTRAVENOUS | Status: DC
  Administered 2012-01-30 (×2): via INTRAVENOUS

## 2012-01-30 MED ORDER — ALUM & MAG HYDROXIDE-SIMETH 200-200-20 MG/5ML PO SUSP
30.0000 mL | Freq: Four times a day (QID) | ORAL | Status: DC | PRN
  Filled 2012-01-30: qty 30

## 2012-01-30 MED ORDER — DIPHENHYDRAMINE HCL 50 MG/ML IJ SOLN: 12.5000 mg | Freq: Four times a day (QID) | INTRAMUSCULAR | Status: DC | PRN

## 2012-01-30 MED ORDER — OXYCODONE HCL 5 MG PO TABS
5.0000 mg | ORAL_TABLET | ORAL | Status: DC | PRN
  Administered 2012-01-30: 10 mg via ORAL
  Filled 2012-01-30: qty 2

## 2012-01-30 MED ORDER — ACETAMINOPHEN 325 MG PO TABS: 650.0000 mg | ORAL_TABLET | ORAL | Status: DC | PRN

## 2012-01-30 MED ORDER — NEOSTIGMINE METHYLSULFATE 1 MG/ML IJ SOLN
INTRAMUSCULAR | Status: DC | PRN
  Administered 2012-01-30: 3 mg via INTRAVENOUS

## 2012-01-30 MED ORDER — LIP MEDEX EX OINT
1.0000 "application " | TOPICAL_OINTMENT | Freq: Two times a day (BID) | CUTANEOUS | Status: DC
  Filled 2012-01-30: qty 7

## 2012-01-30 MED ORDER — ROCURONIUM BROMIDE 100 MG/10ML IV SOLN
INTRAVENOUS | Status: DC | PRN
  Administered 2012-01-30: 50 mg via INTRAVENOUS

## 2012-01-30 MED ORDER — LACTATED RINGERS IV BOLUS (SEPSIS): 1000.0000 mL | Freq: Three times a day (TID) | INTRAVENOUS | Status: DC | PRN

## 2012-01-30 MED ORDER — NAPROXEN 500 MG PO TABS
500.0000 mg | ORAL_TABLET | Freq: Two times a day (BID) | ORAL | Status: DC
  Filled 2012-01-30 (×2): qty 1

## 2012-01-30 MED ORDER — LISINOPRIL 20 MG PO TABS
20.0000 mg | ORAL_TABLET | Freq: Every day | ORAL | Status: DC
  Filled 2012-01-30: qty 1

## 2012-01-30 MED ORDER — BUPIVACAINE-EPINEPHRINE 0.25% -1:200000 IJ SOLN
INTRAMUSCULAR | Status: AC
  Filled 2012-01-30: qty 1

## 2012-01-30 MED ORDER — KETOROLAC TROMETHAMINE 30 MG/ML IJ SOLN
INTRAMUSCULAR | Status: DC | PRN
  Administered 2012-01-30: 30 mg via INTRAVENOUS

## 2012-01-30 MED ORDER — ONDANSETRON HCL 4 MG/2ML IJ SOLN
INTRAMUSCULAR | Status: DC | PRN
  Administered 2012-01-30: 4 mg via INTRAVENOUS

## 2012-01-30 MED ORDER — SODIUM CHLORIDE 0.9 % IJ SOLN: 3.0000 mL | INTRAMUSCULAR | Status: DC | PRN

## 2012-01-30 MED ORDER — FENTANYL CITRATE 0.05 MG/ML IJ SOLN
INTRAMUSCULAR | Status: AC
  Filled 2012-01-30: qty 2

## 2012-01-30 MED ORDER — METOPROLOL TARTRATE 1 MG/ML IV SOLN: 5.0000 mg | Freq: Four times a day (QID) | INTRAVENOUS | Status: DC | PRN

## 2012-01-30 SURGICAL SUPPLY — 52 items
BLADE HEX COATED 2.75 (ELECTRODE) ×3 IMPLANT
BLADE SURG 15 STRL LF DISP TIS (BLADE) ×2 IMPLANT
BLADE SURG 15 STRL SS (BLADE) ×1
CANISTER SUCTION 2500CC (MISCELLANEOUS) ×3 IMPLANT
CHLORAPREP W/TINT 10.5 ML (MISCELLANEOUS) ×3 IMPLANT
CHLORAPREP W/TINT 26ML (MISCELLANEOUS) ×3 IMPLANT
CLOTH BEACON ORANGE TIMEOUT ST (SAFETY) ×3 IMPLANT
DECANTER SPIKE VIAL GLASS SM (MISCELLANEOUS) ×3 IMPLANT
DEVICE SECURE STRAP 25 ABSORB (INSTRUMENTS) ×3 IMPLANT
DISSECTOR BLUNT TIP ENDO 5MM (MISCELLANEOUS) IMPLANT
DRAPE LAPAROSCOPIC ABDOMINAL (DRAPES) ×3 IMPLANT
DRSG TEGADERM 2-3/8X2-3/4 SM (GAUZE/BANDAGES/DRESSINGS) ×6 IMPLANT
DRSG TEGADERM 4X4.75 (GAUZE/BANDAGES/DRESSINGS) ×3 IMPLANT
DURAPREP 26ML APPLICATOR (WOUND CARE) IMPLANT
ELECT REM PT RETURN 9FT ADLT (ELECTROSURGICAL) ×3
ELECTRODE REM PT RTRN 9FT ADLT (ELECTROSURGICAL) ×2 IMPLANT
GLOVE BIOGEL PI IND STRL 7.0 (GLOVE) ×2 IMPLANT
GLOVE BIOGEL PI INDICATOR 7.0 (GLOVE) ×1
GLOVE ECLIPSE 8.0 STRL XLNG CF (GLOVE) ×3 IMPLANT
GLOVE INDICATOR 8.0 STRL GRN (GLOVE) ×3 IMPLANT
GOWN STRL NON-REIN LRG LVL3 (GOWN DISPOSABLE) ×3 IMPLANT
GOWN STRL REIN XL XLG (GOWN DISPOSABLE) ×6 IMPLANT
IV LACTATED RINGERS 1000ML (IV SOLUTION) IMPLANT
KIT BASIN OR (CUSTOM PROCEDURE TRAY) ×3 IMPLANT
MESH ULTRAPRO 6X6 15CM15CM (Mesh General) ×6 IMPLANT
NEEDLE HYPO 22GX1.5 SAFETY (NEEDLE) ×3 IMPLANT
NEEDLE INSUFFLATION 14GA 120MM (NEEDLE) IMPLANT
NS IRRIG 1000ML POUR BTL (IV SOLUTION) ×3 IMPLANT
PACK BASIC VI WITH GOWN DISP (CUSTOM PROCEDURE TRAY) ×3 IMPLANT
PEN SKIN MARKING BROAD (MISCELLANEOUS) ×3 IMPLANT
PENCIL BUTTON HOLSTER BLD 10FT (ELECTRODE) ×3 IMPLANT
SET IRRIG TUBING LAPAROSCOPIC (IRRIGATION / IRRIGATOR) IMPLANT
SLEEVE Z-THREAD 5X100MM (TROCAR) ×3 IMPLANT
SOLUTION ANTI FOG 6CC (MISCELLANEOUS) IMPLANT
SPONGE GAUZE 4X4 12PLY (GAUZE/BANDAGES/DRESSINGS) IMPLANT
SPONGE LAP 18X18 X RAY DECT (DISPOSABLE) IMPLANT
SUT ETHIBOND NAB CT1 #1 30IN (SUTURE) IMPLANT
SUT MNCRL AB 4-0 PS2 18 (SUTURE) ×3 IMPLANT
SUT NOVA NAB GS-21 0 18 T12 DT (SUTURE) IMPLANT
SUT PDS AB 0 CT1 36 (SUTURE) ×3 IMPLANT
SUT PROLENE 0 CT 1 30 (SUTURE) IMPLANT
SUT PROLENE 0 CT 1 CR/8 (SUTURE) IMPLANT
SYR 20CC LL (SYRINGE) ×3 IMPLANT
SYR BULB IRRIGATION 50ML (SYRINGE) IMPLANT
TACKER 5MM HERNIA 3.5CML NAB (ENDOMECHANICALS) IMPLANT
TOWEL OR 17X26 10 PK STRL BLUE (TOWEL DISPOSABLE) ×3 IMPLANT
TRAY LAP CHOLE (CUSTOM PROCEDURE TRAY) ×3 IMPLANT
TROCAR HASSON GELL 12X100 (TROCAR) ×3 IMPLANT
TROCAR XCEL BLUNT TIP 100MML (ENDOMECHANICALS) ×3 IMPLANT
TROCAR Z-THREAD FIOS 5X100MM (TROCAR) ×9 IMPLANT
TUBING INSUFFLATION 10FT LAP (TUBING) ×3 IMPLANT
YANKAUER SUCT BULB TIP 10FT TU (MISCELLANEOUS) ×3 IMPLANT

## 2012-01-30 NOTE — Transfer of Care (Signed)
Immediate Anesthesia Transfer of Care Note  Patient: RAYJON WERY  Procedure(s) Performed: Procedure(s) (LRB): LAPAROSCOPIC BILATERAL INGUINAL HERNIA REPAIR (Bilateral) HERNIA REPAIR UMBILICAL ADULT (N/A) INSERTION OF MESH (Bilateral)  Patient Location: PACU  Anesthesia Type: General  Level of Consciousness: awake, alert , oriented, patient cooperative and responds to stimulation  Airway & Oxygen Therapy: Patient Spontanous Breathing and Patient connected to face mask oxygen  Post-op Assessment: Report given to PACU RN, Post -op Vital signs reviewed and stable and Patient moving all extremities  Post vital signs: Reviewed and stable  Complications: No apparent anesthesia complications

## 2012-01-30 NOTE — Anesthesia Preprocedure Evaluation (Addendum)
Anesthesia Evaluation  Patient identified by MRN, date of birth, ID band Patient awake    Reviewed: Allergy & Precautions, H&P , NPO status , Patient's Chart, lab work & pertinent test results  Airway Mallampati: II TM Distance: >3 FB     Dental  (+) Edentulous Upper, Dental Advisory Given and Poor Dentition   Pulmonary pneumonia -, resolved, Current Smoker,    Pulmonary exam normal       Cardiovascular hypertension, Pt. on medications Rhythm:Regular Rate:Normal     Neuro/Psych negative neurological ROS  negative psych ROS   GI/Hepatic negative GI ROS, Neg liver ROS,   Endo/Other  Type 2  Renal/GU negative Renal ROS  negative genitourinary   Musculoskeletal negative musculoskeletal ROS (+)   Abdominal   Peds negative pediatric ROS (+)  Hematology negative hematology ROS (+)   Anesthesia Other Findings   Reproductive/Obstetrics negative OB ROS                           Anesthesia Physical Anesthesia Plan  ASA: II  Anesthesia Plan: General   Post-op Pain Management:    Induction: Intravenous  Airway Management Planned: Oral ETT  Additional Equipment:   Intra-op Plan:   Post-operative Plan: Extubation in OR  Informed Consent: I have reviewed the patients History and Physical, chart, labs and discussed the procedure including the risks, benefits and alternatives for the proposed anesthesia with the patient or authorized representative who has indicated his/her understanding and acceptance.   Dental advisory given  Plan Discussed with: CRNA  Anesthesia Plan Comments:         Anesthesia Quick Evaluation

## 2012-01-30 NOTE — Anesthesia Postprocedure Evaluation (Signed)
Anesthesia Post Note  Patient: Levi Conner  Procedure(s) Performed: Procedure(s) (LRB): LAPAROSCOPIC BILATERAL INGUINAL HERNIA REPAIR (Bilateral) HERNIA REPAIR UMBILICAL ADULT (N/A) INSERTION OF MESH (Bilateral)  Anesthesia type: General  Patient location: PACU  Post pain: Pain level controlled  Post assessment: Post-op Vital signs reviewed  Last Vitals:  Filed Vitals:   01/30/12 1756  BP: 109/73  Pulse: 88  Temp: 36.6 C  Resp: 14    Post vital signs: Reviewed  Level of consciousness: sedated  Complications: No apparent anesthesia complications

## 2012-01-30 NOTE — Op Note (Addendum)
01/30/2012  4:53 PM  PATIENT:  Levi Conner  55 y.o. male  Patient Care Team: Geraldo Pitter, MD as PCP - General (Family Medicine) Nyoka Cowden, MD as Consulting Physician (Pulmonary Disease) Ardeth Sportsman, MD as Consulting Physician (General Surgery)  PRE-OPERATIVE DIAGNOSIS:    Bilateral ingunial hernias Umbilical hernia  POST-OPERATIVE DIAGNOSIS:    Bilateral ingunial hernias Umbilical hernia  PROCEDURE:  Procedure(s): LAPAROSCOPIC BILATERAL INGUINAL HERNIA REPAIR HERNIA REPAIR UMBILICAL ADULT INSERTION OF MESH  SURGEON:  Surgeon(s): Ardeth Sportsman, MD  ASSISTANT: none   ANESTHESIA:   local and general  EBL:  Total I/O In: 1000 [I.V.:1000] Out: -   Delay start of Pharmacological VTE agent (>24hrs) due to surgical blood loss or risk of bleeding:  no  DRAINS: none   SPECIMEN:  No Specimen  DISPOSITION OF SPECIMEN:  N/A  COUNTS:  YES  PLAN OF CARE: Discharge to home after PACU  PATIENT DISPOSITION:  PACU - hemodynamically stable.  INDICATION: Active male with worsening groin pain.  Found to have bilateral inguinal hernias.  Umbilical hernia as well.  I recommended repair:  The anatomy & physiology of the abdominal wall and pelvic floor was discussed.  The pathophysiology of hernias in the inguinal and pelvic region was discussed.  Natural history risks such as progressive enlargement, pain, incarceration & strangulation was discussed.   Contributors to complications such as smoking, obesity, diabetes, prior surgery, etc were discussed.    I feel the risks of no intervention will lead to serious problems that outweigh the operative risks; therefore, I recommended surgery to reduce and repair the hernia.  I explained laparoscopic techniques with possible need for an open approach.  I noted usual use of mesh to patch and/or buttress hernia repair  Risks such as bleeding, infection, abscess, need for further treatment, heart attack, death, and other risks  were discussed.  I noted a good likelihood this will help address the problem.   Goals of post-operative recovery were discussed as well.  Possibility that this will not correct all symptoms was explained.  I stressed the importance of low-impact activity, aggressive pain control, avoiding constipation, & not pushing through pain to minimize risk of post-operative chronic pain or injury. Possibility of reherniation was discussed.  We will work to minimize complications.     An educational handout further explaining the pathology & treatment options was given as well.  Questions were answered.  The patient expresses understanding & wishes to proceed with surgery.  OR FINDINGS: He had direct > indirect inguinal hernias on the right side.  He had a direct >> indirect hernias on the left side.  He had a small umbilical hernia through the stalk about 8 mm in size.  Reducible.  DESCRIPTION:   The patient was identified & brought into the operating room. He had urinated just prior to going back to the operative room.  The patient was positioned supine with arms tucked. SCDs were active during the entire case. The patient underwent general anesthesia without any difficulty.  The abdomen was prepped and draped in a sterile fashion. A Surgical Timeout confirmed our plan.  I made a transverse incision through the inferior umbilical fold.  I made a small transverse nick through the anterior rectus fascia contralateral to the inguinal hernia side and placed a 0-vicryl stitch through the fascia.  I placed a Hasson trocar into the preperitoneal plane.  Entry was clean.  We induced carbon dioxide insufflation. Camera inspection revealed no  injury.  I used a 10mm angled scope to bluntly free the peritoneum off the infraumbilical anterior abdominal wall.  I created enough of a preperitoneal pocket to place 5mm ports into the right & left mid-abdomen into this preperitoneal cavity.  I focused on the right side first.  I  used blunt & focused sharp dissection to free the peritoneum off the flank and down to the pubic rim.  I freed the anteriolateral bladder wall off the anteriolateral pelvic wall, sparing midline attachments.  He had a moderate sized direct hernia & had a smaller indirect hernia going through the internal ring as well.  I freed the peritoneum off the direct space hernia as well as the spermatic vessels & vas deferens.  I freed peritoneum off the retroperitoneum along the psoas muscle.  There were no breaches in peritoneum that required closure.  I checked & assured hemostasis.    I focused attention on the left side.  He had a more dominant direct hernia on the left.  The indirect hernia on the left side was more subtle but definite as well.  For each side, I chose a 15x15 cm sheet of ultra-lightweight polypropylene mesh (Ultrapro).  I cut a sigmoid-shaped slit at the side of each mesh.  I placed the mesh into the preperitoneal space & laid them as overlapping, mirror-imaged diamonds such that the inferior point was a 6x6 cm corner flap resting in the true pelvis, covering the obturator & femoral foramina.   The medial corners stretched across midline cephalad to the pubic rim, overlapping well.   This provided >2 inch coverage around the hernia.  Because there were moderate size direct hernia is, I didn't place to oral Boals secure strap packs just above the pubic rim and along both recti muscles.  I also did some superolaterally as well.  This helped hold the mesh in place and keep it from migrating out of the direct space.    I held the hernia sacs cephalad and tacked them to the superior part of the mesh in the mid anterior abdominal wall with an absorbable tacker.  I evacuated carbon dioxide.  I then focused on umbilical hernia repair.  I dissected the umbilical stalk and periumbilical subcutaneous tissues off the anterior fascia.  I trimmed off the stalk to expose an 8 mm umbilical hernia.  I primarily  closed transversely using interrupted 0 PDS stitches.  I closed the fascia defect on the left rectus muscle using interrupted 0 PDS stitch as well.  I tacked the umbilical stalk down to the fascia using a 0 Vicryl stitch.  I closed the fascia with absorbable suture.  I closed the skin using 4-0 monocryl stitch.  Sterile dressings were applied. The patient was extubated & arrived in the PACU in stable condition..  I had discussed postoperative care with the patient in the holding area.  I am about to locate the patient's family and discuss operative findings and postoperative goals / instructions.  Instructions are written in the chart as well.

## 2012-01-30 NOTE — Preoperative (Signed)
Beta Blockers   Reason not to administer Beta Blockers:Not Applicable 

## 2012-01-30 NOTE — H&P (Signed)
Levi Conner  12-Feb-1956 [id number]  CARE TEAM:  PCP: Geraldo Pitter, MD  Outpatient Care Team: Patient Care Team: Geraldo Pitter, MD as PCP - General (Family Medicine) Nyoka Cowden, MD as Consulting Physician (Pulmonary Disease) Ardeth Sportsman, MD as Consulting Physician (General Surgery)  Inpatient Treatment Team: Treatment Team: Attending Provider: Ardeth Sportsman, MD   This patient is a 56 y.o.male who presents today for surgical evaluation at the request of Dr. Parke Simmers.   Reason for visit: Groin & umb hernias.   Patient is a middle-aged male. Recently stopped smoking. Has had groin swellings for decades. 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 However, he's had worsening groin pain and can no longer work more than a day or 2 before the pain gets severe. He mentioned this his primary care physician. She recommended surgical evaluation for hernias in both groins. On ABx for a pneumonia followed by Dr. Sherene Sires  The patient also notes that he's had a mass around the anus for a while. Painful periods of discomfort. Has a bowel movement everyday. None any fiber supplements. He can walk 60 minutes a day in the morning regularly. No prior abdominal surgery. No prior hernia surgery.  No new events   Patient Active Problem List  Diagnosis  . PNA (pneumonia)  . Smoker  . Bilateral inguinal hernia (BIH)  . Umbilical hernia  . Internal prolapsed hemorrhoids  . Hypertension    Past Medical History  Diagnosis Date  . Hypertension   . Borderline diabetic     diet controlled, pt does not check cbg  . Hernia, inguinal, bilateral     current  . Rash  since 7-8-201    red rash around bellybutton and belt line and both feet  - resolved  . Diabetes mellitus     no medications  . Pneumonia 10-2011    HOSPITALIZED FOR PNEUMONIA MAY 2013.  PT HAS PULMONARY CLEARANCE FROM DR. WERT FOR HERNIA REPAIR WITH DR. Michaell Cowing    Past Surgical History  Procedure Date  . No past surgeries      History   Social History  . Marital Status: Single    Spouse Name: N/A    Number of Children: N/A  . Years of Education: N/A   Occupational History  . Holiday representative    Social History Main Topics  . Smoking status: Current Some Day Smoker -- 0.3 packs/day for 30 years    Types: Cigarettes  . Smokeless tobacco: Never Used  . Alcohol Use: 1.8 oz/week    3 Cans of beer per week  . Drug Use: No  . Sexually Active: Not Currently   Other Topics Concern  . Not on file   Social History Narrative   Is concerned about a significant mold problem in his friend's home where he is living.     History reviewed. No pertinent family history.  Current Facility-Administered Medications  Medication Dose Route Frequency Provider Last Rate Last Dose  . clindamycin (CLEOCIN) IVPB 900 mg  900 mg Intravenous On Call to OR Ardeth Sportsman, MD      . gentamicin (GARAMYCIN) 350 mg in dextrose 5 % 50 mL IVPB  350 mg Intravenous On Call to OR Ardeth Sportsman, MD      . lactated ringers infusion   Intravenous Continuous Einar Pheasant, MD      . mupirocin ointment Idelle Jo) 2 %   Nasal Once Ardeth Sportsman, MD  Allergies  Allergen Reactions  . Penicillins Other (See Comments)    unknown  . Sulfa Antibiotics Other (See Comments)    unknown    ROS: Constitutional:  No fevers, chills, sweats.  Weight stable Eyes:  No vision changes, No discharge HENT:  No sore throats, nasal drainage Lymph: No neck swelling, No bruising easily Pulmonary:  No cough, productive sputum CV: No orthopnea, PND  No exertional chest/neck/shoulder/arm pain.  GI: No personal nor family history of GI/colon cancer, inflammatory bowel disease, irritable bowel syndrome, allergy such as Celiac Sprue, dietary/dairy problems, colitis, ulcers nor gastritis.    No recent sick contacts/gastroenteritis.  No travel outside the country.  No changes in diet.  Renal: No UTIs, No hematuria Genital:  No drainage,  bleeding, masses Musculoskeletal: No severe joint pain.  Good ROM major joints Skin:  No sores or lesions.  No rashes Heme/Lymph:  No easy bleeding.  No swollen lymph nodes  BP 113/76  Pulse 80  Temp 97.5 F (36.4 C) (Oral)  Resp 18  Ht 5\' 7"  (1.702 m)  Wt 149 lb 8 oz (67.813 kg)  BMI 23.42 kg/m2  SpO2 97%  Physical Exam: General: Pt awake/alert/oriented x4 in no major acute distress Eyes: PERRL, normal EOM. Sclera nonicteric Neuro: CN II-XII intact w/o focal sensory/motor deficits. Lymph: No head/neck/groin lymphadenopathy Psych:  No delerium/psychosis/paranoia HENT: Normocephalic, Mucus membranes moist.  No thrush Neck: Supple, No tracheal deviation Chest: No pain.  Good respiratory excursion. CV:  Pulses intact.  Regular rhythm Abdomen: Soft, Nondistended.  Nontender.  Umb & BIHernias. Ext:  SCDs BLE.  No significant edema.  No cyanosis Skin: No petechiae / purpurae  Results:   Labs: Results for orders placed during the hospital encounter of 01/30/12 (from the past 48 hour(s))  CBC     Status: Abnormal   Collection Time   01/30/12 11:20 AM      Component Value Range Comment   WBC 4.9  4.0 - 10.5 K/uL WHITE COUNT CONFIRMED ON SMEAR   RBC 4.78  4.22 - 5.81 MIL/uL    Hemoglobin 16.8  13.0 - 17.0 g/dL    HCT 41.3  24.4 - 01.0 %    MCV 95.8  78.0 - 100.0 fL    MCH 35.1 (*) 26.0 - 34.0 pg    MCHC 36.7 (*) 30.0 - 36.0 g/dL PRE-WARMING TECHNIQUE USED   RDW 12.9  11.5 - 15.5 %    Platelets 154  150 - 400 K/uL PLATELET COUNT CONFIRMED BY SMEAR  BASIC METABOLIC PANEL     Status: Abnormal   Collection Time   01/30/12 11:20 AM      Component Value Range Comment   Sodium 135  135 - 145 mEq/L    Potassium 4.3  3.5 - 5.1 mEq/L    Chloride 101  96 - 112 mEq/L    CO2 25  19 - 32 mEq/L    Glucose, Bld 120 (*) 70 - 99 mg/dL    BUN 14  6 - 23 mg/dL    Creatinine, Ser 2.72  0.50 - 1.35 mg/dL    Calcium 9.6  8.4 - 53.6 mg/dL    GFR calc non Af Amer >90  >90 mL/min    GFR calc Af  Amer >90  >90 mL/min   SURGICAL PCR SCREEN     Status: Normal   Collection Time   01/30/12 11:39 AM      Component Value Range Comment   MRSA, PCR NEGATIVE  NEGATIVE  Staphylococcus aureus NEGATIVE  NEGATIVE   GLUCOSE, CAPILLARY     Status: Abnormal   Collection Time   01/30/12 12:25 PM      Component Value Range Comment   Glucose-Capillary 120 (*) 70 - 99 mg/dL     Imaging / Studies: No results found.  Medications / Allergies: per chart  Antibiotics: Anti-infectives     Start     Dose/Rate Route Frequency Ordered Stop   01/30/12 1145   clindamycin (CLEOCIN) IVPB 900 mg        900 mg 100 mL/hr over 30 Minutes Intravenous On call to O.R. 01/30/12 1138 01/31/12 0559   01/30/12 0600   gentamicin (GARAMYCIN) 350 mg in dextrose 5 % 50 mL IVPB     Comments: Pharmacy may adjust dosing strength, schedule, rate of infusion, etc as needed to optimize therapy Dose increased to 5 mg/kg per policy based on weight of 70kg      350 mg 117.5 mL/hr over 30 Minutes Intravenous On call to O.R. 01/29/12 2005 01/31/12 0559          Assessment  Macky Lower Sosinski  56 y.o. male  Day of Surgery  Procedure(s): LAPAROSCOPIC BILATERAL INGUINAL HERNIA REPAIR HERNIA REPAIR UMBILICAL ADULT INSERTION OF MESH  Problem List:  Active Problems:  * No active hospital problems. *   Umb & BIH  Plan:  Lap repair:  The anatomy & physiology of the abdominal wall and pelvic floor was discussed.  The pathophysiology of hernias in the inguinal and pelvic region was discussed.  Natural history risks such as progressive enlargement, pain, incarceration & strangulation was discussed.   Contributors to complications such as smoking, obesity, diabetes, prior surgery, etc were discussed.    I feel the risks of no intervention will lead to serious problems that outweigh the operative risks; therefore, I recommended surgery to reduce and repair the hernia.  I explained laparoscopic techniques with possible need  for an open approach.  I noted usual use of mesh to patch and/or buttress hernia repair  Risks such as bleeding, infection, abscess, need for further treatment, heart attack, death, and other risks were discussed.  I noted a good likelihood this will help address the problem.   Goals of post-operative recovery were discussed as well.  Possibility that this will not correct all symptoms was explained.  I stressed the importance of low-impact activity, aggressive pain control, avoiding constipation, & not pushing through pain to minimize risk of post-operative chronic pain or injury. Possibility of reherniation was discussed.  We will work to minimize complications.     An educational handout further explaining the pathology & treatment options was given as well.  Questions were answered.  The patient expresses understanding & wishes to proceed with surgery.    -VTE prophylaxis- SCDs, etc -mobilize as tolerated to help recovery  Ardeth Sportsman, M.D., F.A.C.S. Gastrointestinal and Minimally Invasive Surgery [facility] [address], [address] [place], [place] [address] [phone] Main / Paging [phone] Voice Mail   01/30/2012

## 2012-01-30 NOTE — Progress Notes (Signed)
Patient unable to void after multiple attempts, Bladder scan 240cc in bladder, patient wanting to go home, Dr. Johna Sheriff paged, ordered in an out cath prior to discharge. In an out cath done 200cc drained from bladder. Patient discharged with the understanding he is to return to ER if unable void.

## 2012-01-31 ENCOUNTER — Telehealth (INDEPENDENT_AMBULATORY_CARE_PROVIDER_SITE_OTHER): Payer: Self-pay

## 2012-01-31 ENCOUNTER — Encounter (HOSPITAL_COMMUNITY): Payer: Self-pay | Admitting: Surgery

## 2012-01-31 NOTE — Telephone Encounter (Signed)
I called the pt to check on him postop and make sure he had his follow up.  He said he had difficulties urinating this am but he did go.  He said they had to catheterize him last night before he went home.  I told him he may be a little swollen from that.  He will call with any further problems.  He has an appointment 8/21.

## 2012-02-06 ENCOUNTER — Telehealth (INDEPENDENT_AMBULATORY_CARE_PROVIDER_SITE_OTHER): Payer: Self-pay

## 2012-02-06 DIAGNOSIS — Z8719 Personal history of other diseases of the digestive system: Secondary | ICD-10-CM

## 2012-02-06 MED ORDER — HYDROCODONE-ACETAMINOPHEN 5-325 MG PO TABS: 1.0000 | ORAL_TABLET | Freq: Four times a day (QID) | ORAL | Status: AC | PRN

## 2012-02-06 NOTE — Telephone Encounter (Signed)
Pt calling in requesting refill on Oxycodone. I advised pt that we could call in the automatic refill for Hydrocodone 5/325mg  #30 to [facility] per Dr Michaell Cowing. The pt understands. I called the pharmacy at [facility] for the pt.

## 2012-02-12 ENCOUNTER — Encounter (INDEPENDENT_AMBULATORY_CARE_PROVIDER_SITE_OTHER): Payer: Self-pay | Admitting: Surgery

## 2012-02-12 ENCOUNTER — Ambulatory Visit (INDEPENDENT_AMBULATORY_CARE_PROVIDER_SITE_OTHER): Payer: Self-pay | Admitting: Surgery

## 2012-02-12 VITALS — BP 118/80 | HR 82 | Temp 96.9°F | Ht 67.0 in | Wt 148.4 lb

## 2012-02-12 DIAGNOSIS — K4020 Bilateral inguinal hernia, without obstruction or gangrene, not specified as recurrent: Secondary | ICD-10-CM

## 2012-02-12 DIAGNOSIS — K429 Umbilical hernia without obstruction or gangrene: Secondary | ICD-10-CM

## 2012-02-12 NOTE — Progress Notes (Signed)
Subjective:     Patient ID: Levi Conner, male   DOB: 04/21/1956, 56 y.o.   MRN: [id number]  HPI  Levi Conner  01/29/1956 [id number]  Patient Care Team: Geraldo Pitter, MD as PCP - General (Family Medicine) Nyoka Cowden, MD as Consulting Physician (Pulmonary Disease) Ardeth Sportsman, MD as Consulting Physician (General Surgery)  This patient is a 56 y.o.male who presents today for surgical evaluation.   Procedure: Laparoscopic bilateral inguinal and umbilical hernia repairs 01/30/2012  The patient comes in today sore but feeling better.  Preferred oxycodone.  Noticed his cough went away when he was on that.  Has quit smoking.  Had some irritation with the Tegaderm dressings but nearly resolved.  No problems with constipation or urination.  Not taking any pain meds right now.  Wanting to get back to work  Patient Active Problem List  Diagnosis  . PNA (pneumonia)  . Smoker  . Bilateral inguinal hernia (BIH)  . Umbilical hernia  . Internal prolapsed hemorrhoids  . Hypertension    Past Medical History  Diagnosis Date  . Hypertension   . Borderline diabetic     diet controlled, pt does not check cbg  . Hernia, inguinal, bilateral     current  . Rash  since 7-8-201    red rash around bellybutton and belt line and both feet  - resolved  . Diabetes mellitus     no medications  . Pneumonia 10-2011    HOSPITALIZED FOR PNEUMONIA MAY 2013.  PT HAS PULMONARY CLEARANCE FROM DR. WERT FOR HERNIA REPAIR WITH DR. Michaell Cowing    Past Surgical History  Procedure Date  . No past surgeries   . Inguinal hernia repair 01/30/2012    Procedure: LAPAROSCOPIC BILATERAL INGUINAL HERNIA REPAIR;  Surgeon: Ardeth Sportsman, MD;  Location: [facility];  Service: General;  Laterality: Bilateral;  . Umbilical hernia repair 01/30/2012    Procedure: HERNIA REPAIR UMBILICAL ADULT;  Surgeon: Ardeth Sportsman, MD;  Location: [facility];  Service: General;  Laterality: N/A;  Primary Umbilical Hernia Repair  . Hernia  repair 01/30/2012    umb hernia    History   Social History  . Marital Status: Single    Spouse Name: N/A    Number of Children: N/A  . Years of Education: N/A   Occupational History  . Holiday representative    Social History Main Topics  . Smoking status: Current Some Day Smoker -- 0.3 packs/day for 30 years    Types: Cigarettes  . Smokeless tobacco: Never Used  . Alcohol Use: 1.8 oz/week    3 Cans of beer per week  . Drug Use: No  . Sexually Active: Not Currently   Other Topics Concern  . Not on file   Social History Narrative   Is concerned about a significant mold problem in his friend's home where he is living.     History reviewed. No pertinent family history.  Current Outpatient Prescriptions  Medication Sig Dispense Refill  . diphenhydrAMINE (SOMINEX) 25 MG tablet Take 25 mg by mouth daily as needed. Allergies      . HYDROcodone-acetaminophen (NORCO) 5-325 MG per tablet Take 1-2 tablets by mouth every 6 (six) hours as needed for pain.  30 tablet  0  . hydrocortisone 2.5 % cream Apply 1 application topically 2 (two) times daily.      Marland Kitchen lisinopril (PRINIVIL,ZESTRIL) 20 MG tablet Take 20 mg by mouth daily before breakfast.      .  Multiple Vitamin (MULITIVITAMIN WITH MINERALS) TABS Take 1 tablet by mouth daily.         Allergies  Allergen Reactions  . Penicillins Other (See Comments)    unknown  . Sulfa Antibiotics Other (See Comments)    unknown    BP 118/80  Pulse 82  Temp 96.9 F (36.1 C) (Temporal)  Ht 5\' 7"  (1.702 m)  Wt 148 lb 6.4 oz (67.314 kg)  BMI 23.24 kg/m2  No results found.   Review of Systems  Constitutional: Negative for fever, chills and diaphoresis.  HENT: Negative for sore throat, trouble swallowing and neck pain.   Eyes: Negative for photophobia and visual disturbance.  Respiratory: Negative for choking and shortness of breath.   Cardiovascular: Negative for chest pain and palpitations.  Gastrointestinal: Negative for nausea, vomiting,  abdominal distention, anal bleeding and rectal pain.  Genitourinary: Negative for dysuria, urgency, difficulty urinating and testicular pain.  Musculoskeletal: Negative for myalgias, arthralgias and gait problem.  Skin: Negative for color change and rash.  Neurological: Negative for dizziness, speech difficulty, weakness and numbness.  Hematological: Negative for adenopathy.  Psychiatric/Behavioral: Negative for hallucinations, confusion and agitation.       Objective:   Physical Exam  Constitutional: He is oriented to person, place, and time. He appears well-developed and well-nourished. No distress.  HENT:  Head: Normocephalic.  Mouth/Throat: Oropharynx is clear and moist. No oropharyngeal exudate.  Eyes: Conjunctivae and EOM are normal. Pupils are equal, round, and reactive to light. No scleral icterus.  Neck: Normal range of motion. No tracheal deviation present.  Cardiovascular: Normal rate, normal heart sounds and intact distal pulses.   Pulmonary/Chest: Effort normal. No respiratory distress.  Abdominal: Soft. He exhibits no distension. There is no tenderness. Hernia confirmed negative in the right inguinal area and confirmed negative in the left inguinal area.       Incisions clean with normal healing ridges.  No hernias  Musculoskeletal: Normal range of motion. He exhibits no tenderness.  Neurological: He is alert and oriented to person, place, and time. No cranial nerve deficit. He exhibits normal muscle tone. Coordination normal.  Skin: Skin is warm and dry. No rash noted. He is not diaphoretic.  Psychiatric: He has a normal mood and affect. His behavior is normal.       Assessment:     Status post laparoscopic repair of bilateral inguinal hernias and primary repair of umbilical hernia.  Recovering well.    Plan:     Increase activity as tolerated.  Do not push through pain.  See PCP Dr. Parke Simmers for the chronic cough.  I do not want to read prescribe narcotics as a cough  suppressant.  Perhaps he needs to see Dr. Sherene Sires as well if it is a persistent problem.  Continue to avoid smoking  Advanced on diet as tolerated. Bowel regimen to avoid problems.  Return to clinic p.r.n. Okay to return to work.  The patient expressed understanding and appreciation

## 2012-02-12 NOTE — Patient Instructions (Addendum)
Managing Pain  Pain after surgery or related to activity is often due to strain/injury to muscle, tendon, nerves and/or incisions.  This pain is usually short-term and will improve in a few months.   Many people find it helpful to do the following things TOGETHER to help speed the process of healing and to get back to regular activity more quickly:  1. Avoid heavy physical activity a.  no lifting greater than 20 pounds b. Do not "push through" the pain.  Listen to your body and avoid positions and maneuvers than reproduce the pain c. Walking is okay as tolerated, but go slowly and stop when getting sore.  d. Remember: If it hurts to do it, then don't do it! 2. Take Anti-inflammatory medication  a. Take with food/snack around the clock for 1-2 weeks i. This helps the muscle and nerve tissues become less irritable and calm down faster b. Choose ONE of the following over-the-counter medications: i. Naproxen 220mg  tabs (ex. Aleve) 1-2 pills twice a day  ii. Ibuprofen 200mg  tabs (ex. Advil, Motrin) 3-4 pills with every meal and just before bedtime iii. Acetaminophen 500mg  tabs (Tylenol) 1-2 pills with every meal and just before bedtime 3. Use a Heating pad or Ice/Cold Pack a. 4-6 times a day b. May use warm bath/hottub  or showers 4. Try Gentle Massage and/or Stretching  a. at the area of pain many times a day b. stop if you feel pain - do not overdo it  Try these steps together to help you body heal faster and avoid making things get worse.  Doing just one of these things may not be enough.    If you are not getting better after two weeks or are noticing you are getting worse, contact our office for further advice; we may need to re-evaluate you & see what other things we can do to help.  Follow up with your Primary Care MD for your Chronic Cough  A cough is a reflex that helps clear your throat and airways. It can help heal the body or may be a reaction to an irritated airway. A cough may  only last 2 or 3 weeks (acute) or may last more than 8 weeks (chronic).  CAUSES Acute cough:  Viral or bacterial infections.  Chronic cough:  Infections.   Allergies.   Asthma.   Post-nasal drip.   Smoking.   Heartburn or acid reflux.   Some medicines.   Chronic lung problems (COPD).   Cancer.  SYMPTOMS   Cough.   Fever.   Chest pain.   Increased breathing rate.   High-pitched whistling sound when breathing (wheezing).   Colored mucus that you cough up (sputum).  TREATMENT   A bacterial cough may be treated with antibiotic medicine.   A viral cough must run its course and will not respond to antibiotics.   Your caregiver may recommend other treatments if you have a chronic cough.  HOME CARE INSTRUCTIONS   Only take over-the-counter or prescription medicines for pain, discomfort, or fever as directed by your caregiver. Use cough suppressants only as directed by your caregiver.   Use a cold steam vaporizer or humidifier in your bedroom or home to help loosen secretions.   Sleep in a semi-upright position if your cough is worse at night.   Rest as needed.   Stop smoking if you smoke.  SEEK IMMEDIATE MEDICAL CARE IF:   You have pus in your sputum.   Your cough starts to worsen.  You cannot control your cough with suppressants and are losing sleep.   You begin coughing up blood.   You have difficulty breathing.   You develop pain which is getting worse or is uncontrolled with medicine.   You have a fever.  MAKE SURE YOU:   Understand these instructions.   Will watch your condition.   Will get help right away if you are not doing well or get worse.  Document Released: 12/07/2010 Document Revised: 05/30/2011 Document Reviewed: 12/07/2010 [facility] Patient Information 2012 [place], [place].

## 2012-03-09 ENCOUNTER — Emergency Department (HOSPITAL_COMMUNITY)
Admission: EM | Admit: 2012-03-09 | Discharge: 2012-03-09 | Disposition: A | Discharge status: Home / Self Care | Payer: Self-pay | Attending: Emergency Medicine | Admitting: Emergency Medicine

## 2012-03-09 ENCOUNTER — Encounter (HOSPITAL_COMMUNITY): Payer: Self-pay | Admitting: Emergency Medicine

## 2012-03-09 DIAGNOSIS — I10 Essential (primary) hypertension: Secondary | ICD-10-CM | POA: Insufficient documentation

## 2012-03-09 DIAGNOSIS — E119 Type 2 diabetes mellitus without complications: Secondary | ICD-10-CM | POA: Insufficient documentation

## 2012-03-09 DIAGNOSIS — Z760 Encounter for issue of repeat prescription: Secondary | ICD-10-CM | POA: Insufficient documentation

## 2012-03-09 DIAGNOSIS — F17200 Nicotine dependence, unspecified, uncomplicated: Secondary | ICD-10-CM | POA: Insufficient documentation

## 2012-03-09 MED ORDER — OLMESARTAN MEDOXOMIL 20 MG PO TABS: 20.0000 mg | ORAL_TABLET | Freq: Every day | ORAL | Status: DC

## 2012-03-09 MED ORDER — LISINOPRIL 20 MG PO TABS: 20.0000 mg | ORAL_TABLET | Freq: Every day | ORAL | Status: DC

## 2012-03-09 NOTE — ED Provider Notes (Signed)
History     CSN: [id number]  Arrival date & time 03/09/12  7829   First MD Initiated Contact with Patient 03/09/12 1054      Chief Complaint  Patient presents with  . Medication Refill  . Hypertension    (Consider location/radiation/quality/duration/timing/severity/associated sxs/prior treatment) HPI Comments: Patient with a history of hypertension presents emergency department needing a refill of his blood pressure medication.  He states that previous to [facility] he had an orange card that'll allow him to see his PCP with a co-pay of $35, however now his PCP is discharging him $100 up visits since there is no longer orange card coverage.  Patient has been out of his blood pressure medication for 8 days.  He denies any headaches, dizziness, difficulty urinating, abdominal pain, melena, back pain, nausea, vomiting, change in bowel movements or appetite.  Patient is a 56 y.o. male presenting with hypertension. The history is provided by the patient.  Hypertension Pertinent negatives include no abdominal pain, chest pain, chills, congestion, fever, headaches, numbness or weakness.    Past Medical History  Diagnosis Date  . Hypertension   . Borderline diabetic     diet controlled, pt does not check cbg  . Hernia, inguinal, bilateral     current  . Rash  since 7-8-201    red rash around bellybutton and belt line and both feet  - resolved  . Diabetes mellitus     no medications  . Pneumonia 10-2011    HOSPITALIZED FOR PNEUMONIA MAY 2013.  PT HAS PULMONARY CLEARANCE FROM DR. WERT FOR HERNIA REPAIR WITH DR. Michaell Cowing    Past Surgical History  Procedure Date  . No past surgeries   . Inguinal hernia repair 01/30/2012    Procedure: LAPAROSCOPIC BILATERAL INGUINAL HERNIA REPAIR;  Surgeon: Ardeth Sportsman, MD;  Location: [facility];  Service: General;  Laterality: Bilateral;  . Umbilical hernia repair 01/30/2012    Procedure: HERNIA REPAIR UMBILICAL ADULT;  Surgeon: Ardeth Sportsman, MD;   Location: [facility];  Service: General;  Laterality: N/A;  Primary Umbilical Hernia Repair  . Hernia repair 01/30/2012    umb hernia    History reviewed. No pertinent family history.  History  Substance Use Topics  . Smoking status: Current Some Day Smoker -- 0.3 packs/day for 30 years    Types: Cigarettes  . Smokeless tobacco: Never Used  . Alcohol Use: 1.8 oz/week    3 Cans of beer per week      Review of Systems  Constitutional: Negative for fever, chills and appetite change.  HENT: Negative for congestion.   Eyes: Negative for visual disturbance.  Respiratory: Negative for shortness of breath.   Cardiovascular: Negative for chest pain and leg swelling.  Gastrointestinal: Negative for abdominal pain.  Genitourinary: Negative for dysuria, urgency and frequency.  Neurological: Negative for dizziness, syncope, weakness, light-headedness, numbness and headaches.  Psychiatric/Behavioral: Negative for confusion.  All other systems reviewed and are negative.    Allergies  Penicillins and Sulfa antibiotics  Home Medications   Current Outpatient Rx  Name Route Sig Dispense Refill  . HYDROCORTISONE 2.5 % EX CREA Topical Apply 1 application topically daily.     Marland Kitchen LISINOPRIL 20 MG PO TABS Oral Take 20 mg by mouth daily.    Marland Kitchen OLMESARTAN MEDOXOMIL 20 MG PO TABS Oral Take 20 mg by mouth daily.      BP 166/105  Pulse 81  Temp 97.8 F (36.6 C) (Oral)  Resp 18  SpO2  100%  Physical Exam  Constitutional: He is oriented to person, place, and time. He appears well-developed and well-nourished. No distress.  HENT:  Head: Normocephalic and atraumatic.  Mouth/Throat: Oropharynx is clear and moist. No oropharyngeal exudate.  Eyes: Conjunctivae normal and EOM are normal. Pupils are equal, round, and reactive to light. No scleral icterus.  Neck: Normal range of motion. Neck supple. No tracheal deviation present. No thyromegaly present.  Cardiovascular: Normal rate, regular rhythm,  normal heart sounds and intact distal pulses.   Pulmonary/Chest: Effort normal and breath sounds normal. No stridor. No respiratory distress. He has no wheezes.  Abdominal: Soft.  Musculoskeletal: Normal range of motion. He exhibits no edema and no tenderness.  Neurological: He is alert and oriented to person, place, and time. Coordination normal.  Skin: Skin is warm and dry. No rash noted. He is not diaphoretic. No erythema. No pallor.  Psychiatric: He has a normal mood and affect. His behavior is normal.    ED Course  Procedures (including critical care time)  Labs Reviewed - No data to display No results found.   No diagnosis found.    MDM  Hypertension medication refill  Pt presents to ED w HTN after not taking his medication for 8 days. He reports he is unable to afford both the medications and the PCP appointment [facility]. He states multiple times that he is completely assymptomatic including HA & dizziness. Pt has no otrher complaints at this time. DC with Rx & 3 refills.         Jaci Carrel, [place] 03/09/12 1108

## 2012-03-09 NOTE — ED Notes (Signed)
Pt sts out of BP meds x 1 week and unable to get meds refilled due to funds; pt sts some lightheadedness; pt sts taking benicar and lisinopril; pt sts recent hernia sx

## 2012-03-10 NOTE — ED Provider Notes (Signed)
Medical screening examination/treatment/procedure(s) were performed by non-physician practitioner and as supervising physician I was immediately available for consultation/collaboration.   Charles B. Sheldon, MD 03/10/12 0833 

## 2012-03-26 ENCOUNTER — Emergency Department (HOSPITAL_COMMUNITY): Payer: Self-pay

## 2012-03-26 ENCOUNTER — Encounter (HOSPITAL_COMMUNITY): Payer: Self-pay | Admitting: Family Medicine

## 2012-03-26 ENCOUNTER — Emergency Department (HOSPITAL_COMMUNITY)
Admission: EM | Admit: 2012-03-26 | Discharge: 2012-03-26 | Disposition: A | Discharge status: Home / Self Care | Payer: Self-pay | Attending: Emergency Medicine | Admitting: Emergency Medicine

## 2012-03-26 ENCOUNTER — Encounter (HOSPITAL_COMMUNITY)
Admission: EM | Disposition: A | Discharge status: Home / Self Care | Payer: Self-pay | Source: Home / Self Care | Attending: Emergency Medicine

## 2012-03-26 DIAGNOSIS — R062 Wheezing: Secondary | ICD-10-CM | POA: Insufficient documentation

## 2012-03-26 DIAGNOSIS — E119 Type 2 diabetes mellitus without complications: Secondary | ICD-10-CM | POA: Insufficient documentation

## 2012-03-26 DIAGNOSIS — T18128A Food in esophagus causing other injury, initial encounter: Secondary | ICD-10-CM

## 2012-03-26 DIAGNOSIS — I10 Essential (primary) hypertension: Secondary | ICD-10-CM | POA: Insufficient documentation

## 2012-03-26 DIAGNOSIS — Z79899 Other long term (current) drug therapy: Secondary | ICD-10-CM | POA: Insufficient documentation

## 2012-03-26 DIAGNOSIS — R1110 Vomiting, unspecified: Secondary | ICD-10-CM | POA: Insufficient documentation

## 2012-03-26 DIAGNOSIS — T18108A Unspecified foreign body in esophagus causing other injury, initial encounter: Secondary | ICD-10-CM | POA: Insufficient documentation

## 2012-03-26 DIAGNOSIS — IMO0002 Reserved for concepts with insufficient information to code with codable children: Secondary | ICD-10-CM | POA: Insufficient documentation

## 2012-03-26 HISTORY — PX: ESOPHAGOGASTRODUODENOSCOPY: SHX5428

## 2012-03-26 IMAGING — CR DG NECK SOFT TISSUE
1 series · 1 of 1 positions shown · non-contrast
Comparison: Chest radiograph from the same date, reported
separately.

CLINICAL DATA: 55-year-old male with esophageal food impaction.

NECK SOFT TISSUES - 1+ VIEW

[w soft tissue neck]
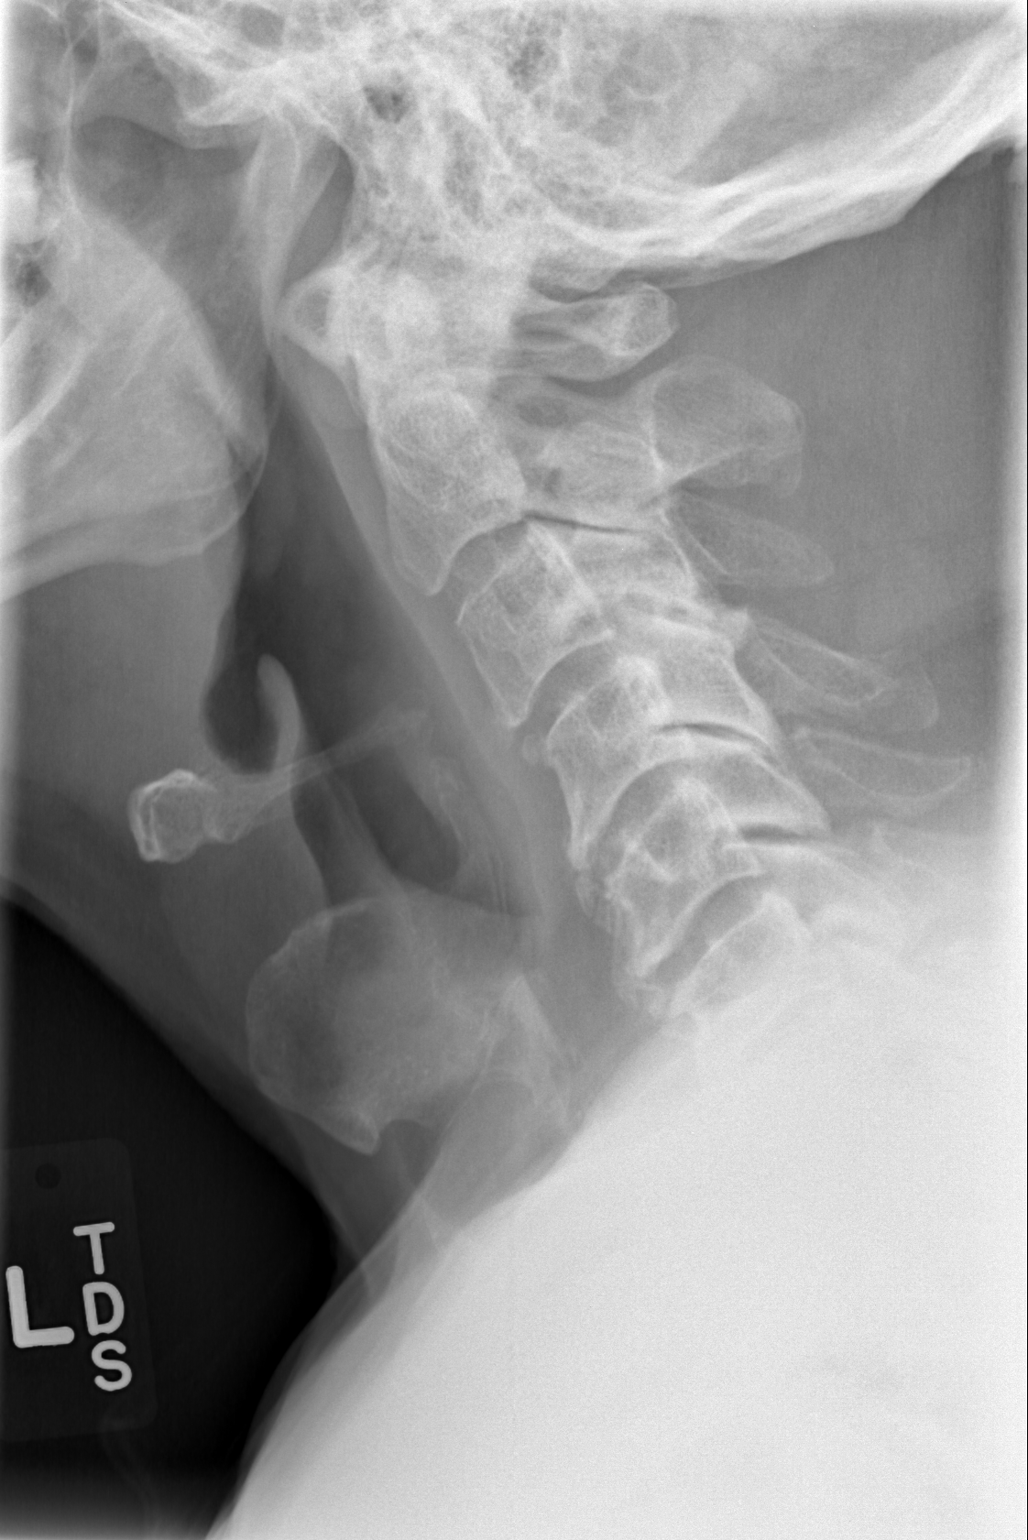

[1 of 1 positions shown; findings below may reference images not displayed]

FINDINGS: Normal prevertebral soft tissue contour.  Normal
epiglottic contour.  Mild gaseous distention of the hypopharynx,
otherwise normal pharyngeal contours. Visualized tracheal air
column is within normal limits.  No evidence of dilatation of the
cervical esophagus.
IMPRESSION: Negative lateral neck soft tissues.

## 2012-03-26 IMAGING — CR DG CHEST 2V
2 series · 2 of 2 positions shown · non-contrast
Comparison: [DATE] and earlier.

CLINICAL DATA: 55-year-old male with esophageal food impaction
behind the sternum.

CHEST - 2 VIEW

[w chest pa]
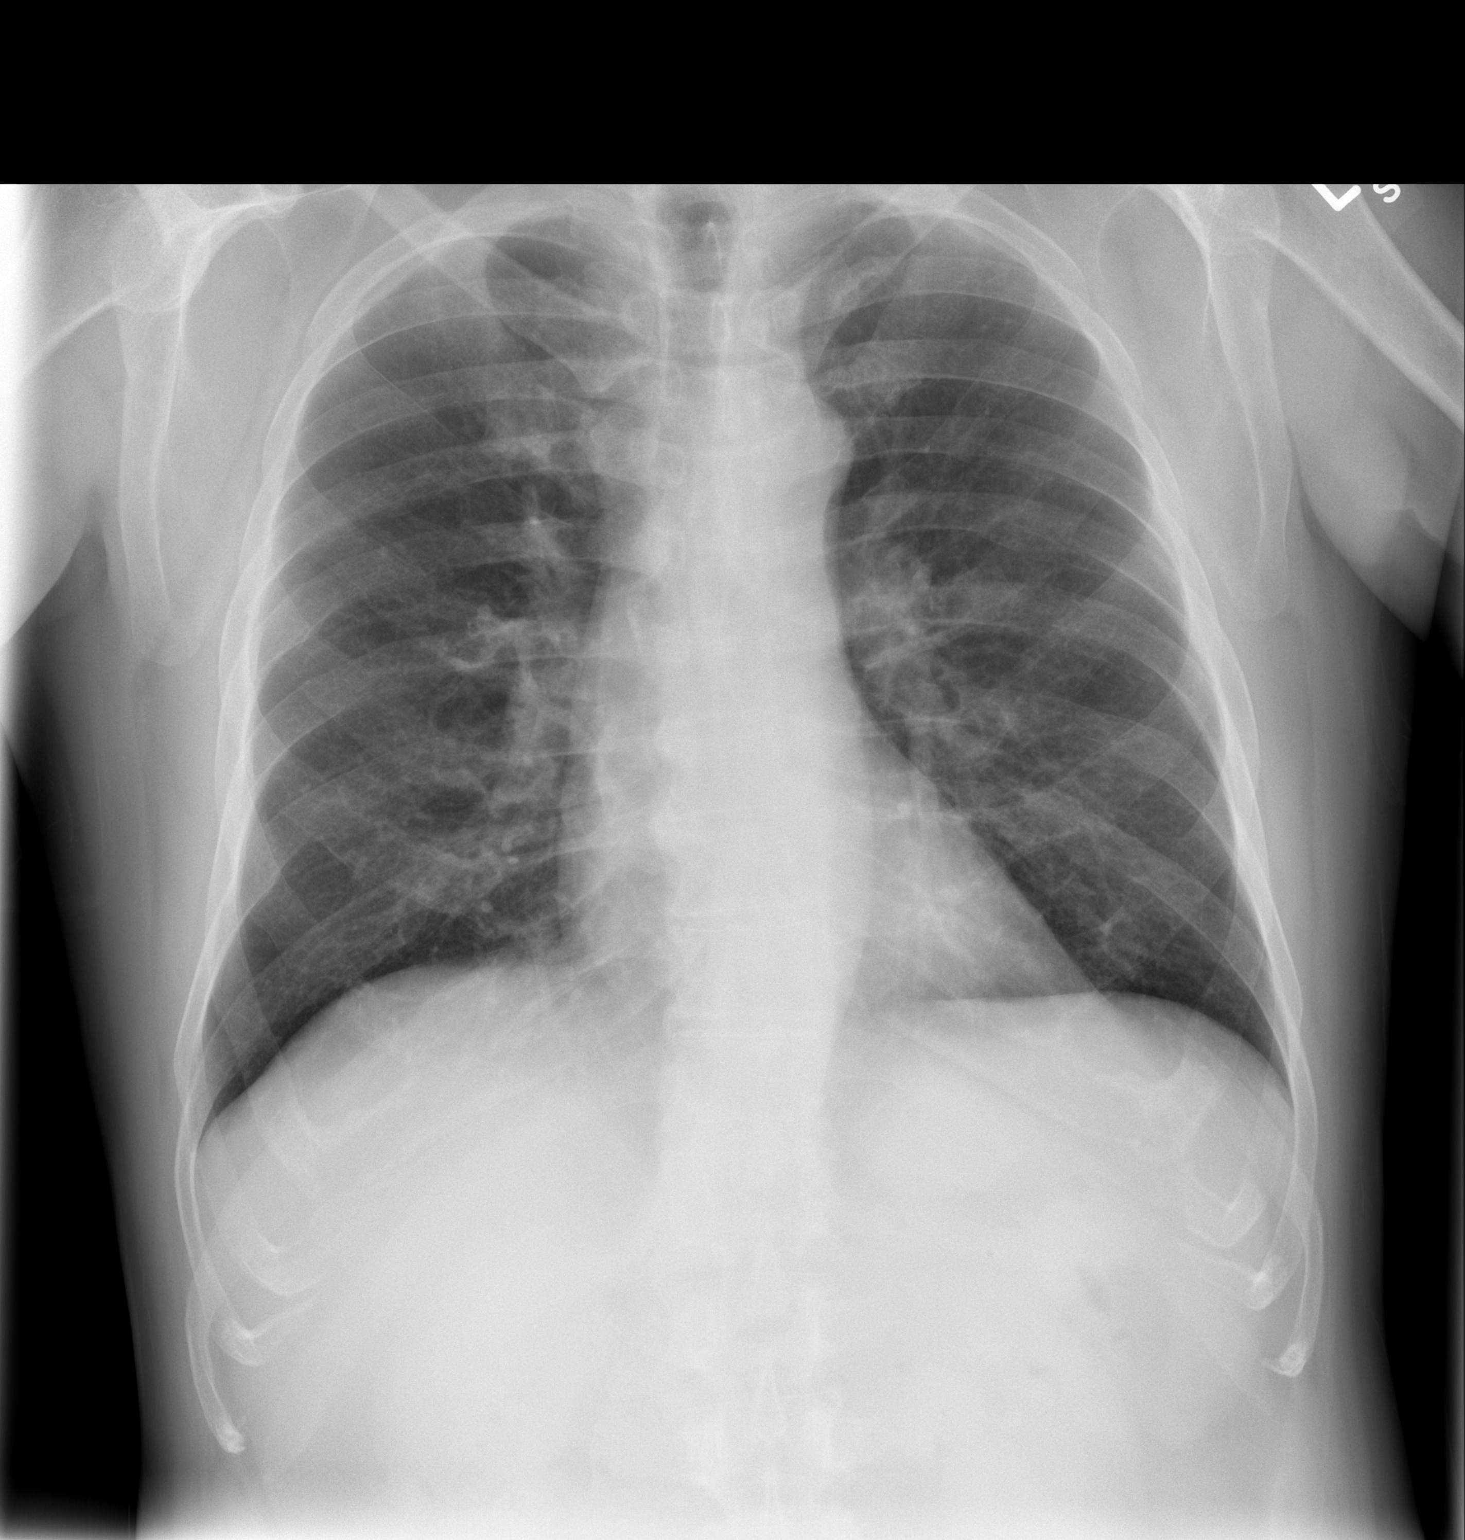

[w chest lat]
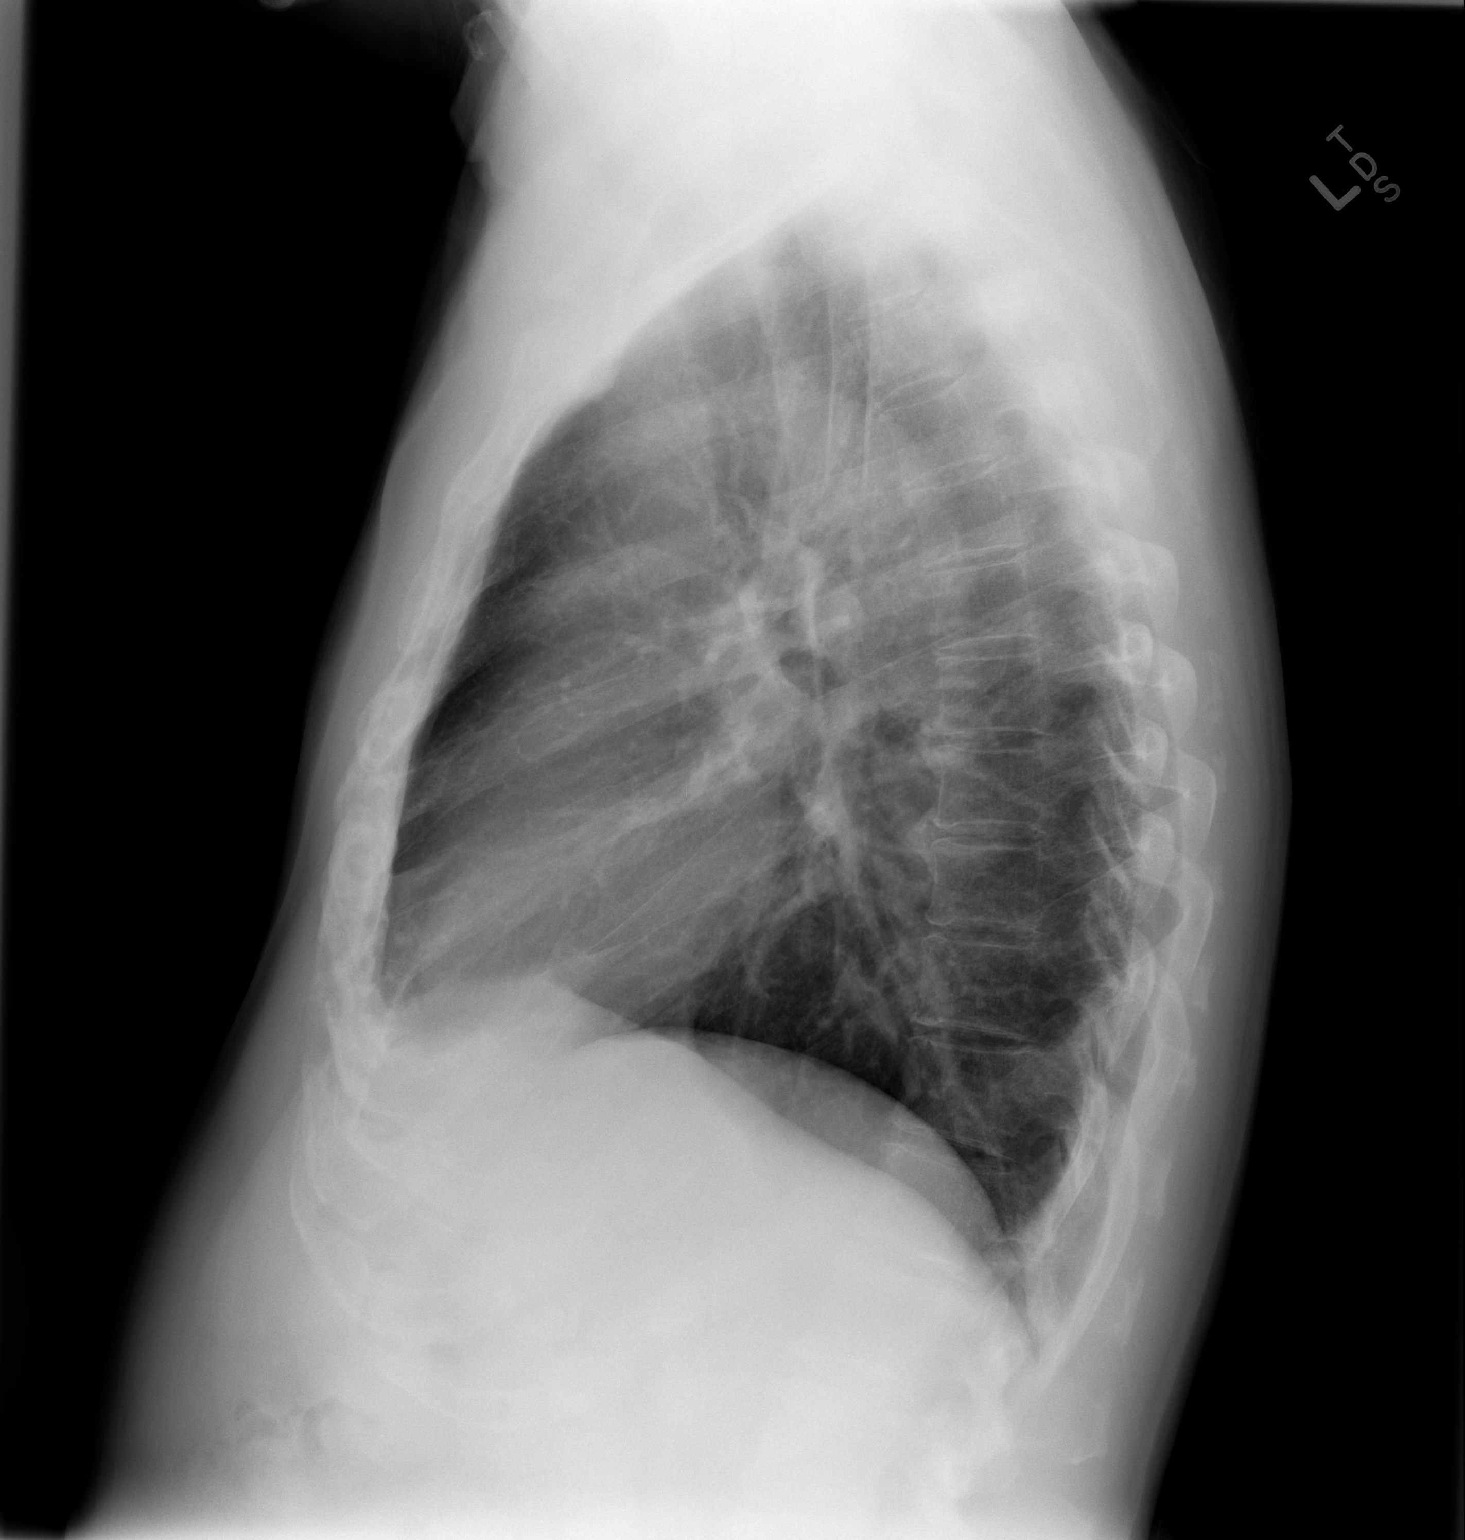

[2 of 2 positions shown; findings below may reference images not displayed]

FINDINGS: Stable and normal lung volumes.  Cardiac size and
mediastinal contours are within normal limits.  Visualized tracheal
air column is within normal limits.  No evidence of dilatation of
the thoracic esophagus.  Lung parenchyma is stable and clear.  No
pneumothorax or effusion. No acute osseous abnormality identified.
IMPRESSION: No acute cardiopulmonary abnormality.

## 2012-03-26 SURGERY — EGD (ESOPHAGOGASTRODUODENOSCOPY)
Anesthesia: Moderate Sedation

## 2012-03-26 MED ORDER — DIPHENHYDRAMINE HCL 50 MG/ML IJ SOLN
INTRAMUSCULAR | Status: DC | PRN
  Administered 2012-03-26: 25 mg via INTRAVENOUS

## 2012-03-26 MED ORDER — FENTANYL CITRATE 0.05 MG/ML IJ SOLN
INTRAMUSCULAR | Status: AC
  Filled 2012-03-26: qty 4

## 2012-03-26 MED ORDER — DIPHENHYDRAMINE HCL 50 MG/ML IJ SOLN
INTRAMUSCULAR | Status: AC
  Filled 2012-03-26: qty 1

## 2012-03-26 MED ORDER — MIDAZOLAM HCL 5 MG/ML IJ SOLN
INTRAMUSCULAR | Status: AC
  Filled 2012-03-26: qty 2

## 2012-03-26 MED ORDER — FENTANYL CITRATE 0.05 MG/ML IJ SOLN
INTRAMUSCULAR | Status: DC | PRN
  Administered 2012-03-26 (×2): 25 ug via INTRAVENOUS

## 2012-03-26 MED ORDER — MIDAZOLAM HCL 10 MG/2ML IJ SOLN
INTRAMUSCULAR | Status: DC | PRN
  Administered 2012-03-26 (×2): 2 mg via INTRAVENOUS

## 2012-03-26 MED ORDER — SODIUM CHLORIDE 0.9 % IV SOLN: INTRAVENOUS | Status: DC

## 2012-03-26 MED ORDER — OMEPRAZOLE 40 MG PO CPDR: 40.0000 mg | DELAYED_RELEASE_CAPSULE | Freq: Every day | ORAL | Status: DC

## 2012-03-26 MED ORDER — MORPHINE SULFATE 4 MG/ML IJ SOLN
4.0000 mg | Freq: Once | INTRAMUSCULAR | Status: AC
  Administered 2012-03-26: 4 mg via INTRAVENOUS
  Filled 2012-03-26: qty 1

## 2012-03-26 MED ORDER — GLUCAGON HCL (RDNA) 1 MG IJ SOLR
1.0000 mg | Freq: Once | INTRAMUSCULAR | Status: AC
  Administered 2012-03-26: 1 mg via INTRAVENOUS
  Filled 2012-03-26: qty 1

## 2012-03-26 MED ORDER — SODIUM CHLORIDE 0.9 % IV SOLN
Freq: Once | INTRAVENOUS | Status: AC
  Administered 2012-03-26: 15:00:00 via INTRAVENOUS

## 2012-03-26 NOTE — ED Notes (Signed)
Per pt feels like he has food stuck in his esophagus. sts since he ate some meat last night. sts burning. Pt tried to vomit and drink fluid to pass it but no relief.

## 2012-03-26 NOTE — ED Provider Notes (Signed)
2:05 PM Assumed care of patient in the CDU from Dr. Juleen China.  Patient presents today with esophageal food impaction.  He states that he ate ribs last evening and has had difficulty swallowing fluids and has had some pain of the thoracic esophagus since that time.  Dr. Juleen China ordered the patient Glucagon.  Patient does not feel that the Glucagon helped.   Patient alert and orientated x 3, no respiratory distress Heart:  RRR Lungs:  CTAB   2:15 PM Discussed with Dr. Elnoria Howard with GI who agrees to come see the patient in the ED to perform Endoscope.  3;00 pm Dr Elnoria Howard reports that he will take the patient upstairs to perform Endoscopy and will discharge the patient from there.  Levi Lux Gates, PA-C 03/26/12 1859

## 2012-03-26 NOTE — ED Notes (Signed)
Pt to EGD in w/c with transporters.  Pt will be d/c from there.

## 2012-03-26 NOTE — ED Provider Notes (Signed)
History  Scribed for Raeford Razor, MD, the patient was seen in room TR09C/TR09C. This chart was scribed by Candelaria Stagers. The patient's care started at 12:42 PM   CSN: [id number]  Arrival date & time 03/26/12  1155   First MD Initiated Contact with Patient 03/26/12 1233      Chief Complaint  Patient presents with  . Foreign Body     The history is provided by the patient. No language interpreter was used.   Levi Conner is a 56 y.o. male who presents to the Emergency Department complaining of feeling like food is stuck in his esophagus after eating ribs last night.  He states he has been vomiting.  He has never had this issue before.  Pt trid to drink fluids to pass with no relief.   Past Medical History  Diagnosis Date  . Hypertension   . Borderline diabetic     diet controlled, pt does not check cbg  . Hernia, inguinal, bilateral     current  . Rash  since 7-8-201    red rash around bellybutton and belt line and both feet  - resolved  . Diabetes mellitus     no medications  . Pneumonia 10-2011    HOSPITALIZED FOR PNEUMONIA MAY 2013.  PT HAS PULMONARY CLEARANCE FROM DR. WERT FOR HERNIA REPAIR WITH DR. Michaell Cowing    Past Surgical History  Procedure Date  . No past surgeries   . Inguinal hernia repair 01/30/2012    Procedure: LAPAROSCOPIC BILATERAL INGUINAL HERNIA REPAIR;  Surgeon: Ardeth Sportsman, MD;  Location: [facility];  Service: General;  Laterality: Bilateral;  . Umbilical hernia repair 01/30/2012    Procedure: HERNIA REPAIR UMBILICAL ADULT;  Surgeon: Ardeth Sportsman, MD;  Location: [facility];  Service: General;  Laterality: N/A;  Primary Umbilical Hernia Repair  . Hernia repair 01/30/2012    umb hernia    History reviewed. No pertinent family history.  History  Substance Use Topics  . Smoking status: Current Some Day Smoker -- 0.3 packs/day for 30 years    Types: Cigarettes  . Smokeless tobacco: Never Used  . Alcohol Use: 1.8 oz/week    3 Cans of beer per week       Review of Systems  Gastrointestinal: Positive for vomiting.       Feels like food is stuck in esophagus.     Allergies  Penicillins and Sulfa antibiotics  Home Medications   Current Outpatient Rx  Name Route Sig Dispense Refill  . HYDROCORTISONE 2.5 % EX CREA Topical Apply 1 application topically daily.     . IBUPROFEN 200 MG PO TABS Oral Take 200 mg by mouth every 6 (six) hours as needed. For pain    . LISINOPRIL 20 MG PO TABS Oral Take 20 mg by mouth daily.      BP 157/92  Pulse 98  Temp 98.3 F (36.8 C) (Oral)  Resp 16  SpO2 94%  Physical Exam  Nursing note and vitals reviewed. Constitutional: He is oriented to person, place, and time. He appears well-developed and well-nourished. No distress.  HENT:  Head: Normocephalic and atraumatic.       Spitting.   Eyes: EOM are normal. Pupils are equal, round, and reactive to light.  Neck: Neck supple. No tracheal deviation present.  Cardiovascular: Normal rate.   Pulmonary/Chest: Effort normal. No respiratory distress.       faint expiratory wheezing bilaterally.    Abdominal: Soft. He exhibits no distension.  Musculoskeletal:  Normal range of motion. He exhibits no edema.  Neurological: He is alert and oriented to person, place, and time.  Skin: Skin is warm and dry.  Psychiatric: He has a normal mood and affect. His behavior is normal.    ED Course  Procedures   DIAGNOSTIC STUDIES: Oxygen Saturation is 94% on room air, normal by my interpretation.    COORDINATION OF CARE:     Labs Reviewed - No data to display No results found.   1. Food impaction of esophagus       MDM  55yM with likely esophageal food impaction. Some drooling. No respiratory distress. Will try glucagon. Soft tissue neck. Anticipate GI consult. Will move to CDU.   I personally preformed the services scribed in my presence. The recorded information has been reviewed and considered. Raeford Razor, MD.        Raeford Razor,  MD 03/26/12 1256

## 2012-03-26 NOTE — Op Note (Signed)
[facility] [address] [place], [address]   OPERATIVE PROCEDURE REPORT  PATIENT: Levi Conner, Levi Conner  MR#: [id number] BIRTHDATE: [birth year]  GENDER: Male ENDOSCOPIST: Jeani Hawking, MD ASSISTANT:   Karie Soda, Technician and Felecia Shelling, RN PROCEDURE DATE: 03/26/2012 PROCEDURE:   EGD, diagnostic ASA CLASS:   Class II INDICATIONS:Food Impaction. MEDICATIONS: Fentanyl 50 mcg IV, Versed 4 mg IV, and Benadryl 25 mg IV TOPICAL ANESTHETIC:  DESCRIPTION OF PROCEDURE:   After the risks benefits and alternatives of the procedure were thoroughly explained, informed consent was obtained.  The Pentax Gastroscope [id number]  endoscope was introduced through the mouth  and advanced to the second portion of the duodenum Without limitations.      The instrument was slowly withdrawn as the mucosa was fully examined.      FINDINGS:: Upon initial entry into the esophagus there was evidence of fluid that was quickly suctioned.  In the distal esophagus was a large bolus of meat.  The meat bolus was able to be pushed through the esophagus and into the gastric lumen.  In the distal esophagus there was evidence of an esohagitis, however, the friability and edema may be as a result of the prolonged food impaction.  In the antrum several erosions were identified as well as a mild duodenitis.   Retroflexed views revealed a hiatal hernia.     The scope was then withdrawn from the patient and the procedure terminated.  COMPLICATIONS: There were no complications. IMPRESSION: 1) LA Grade  B esophagitis. 2) 2-3 cm hiatal hernia. 3) Antral erosions. 4) Duodenitis.  RECOMMENDATIONS: 1) Omeprazole 40 mg QD.  Take the medication 30 minutes before breakfast. 2) Chew food well.  Chew until the food is liquified. 3) Follow up in the office in one month.   _______________________________ eSigned:  Jeani Hawking, MD 03/26/2012 5:27 PM    PATIENT NAME:  [name], [name] MR#: [id number]

## 2012-03-26 NOTE — Consult Note (Signed)
Unassigned Consult  Reason for Consult:Food Impaction. Referring Physician: ER  Levi Conner HPI: This is a 56 year old male who presents to the ER with complaints of dysphagia.  He ate some ribs last evening and subsequently he started to have issues with dysphagia.  It worsened to the point that he was not able to handle his oral secretions.  In the ER glucagon was tried, but it did not resolve his symptoms.  As a result a GI consultation was requested.  He had one prior episode of food impaction 4-5 years that resolved spontaneously.  He has a long history of tobacco abuse, 1 pack every 3 days, and he does drink 2 beers per night.  No family history of esophageal cancer and he denies any consistent history of GERD.  Past Medical History  Diagnosis Date  . Hypertension   . Borderline diabetic     diet controlled, pt does not check cbg  . Hernia, inguinal, bilateral     current  . Rash  since 7-8-201    red rash around bellybutton and belt line and both feet  - resolved  . Pneumonia 10-2011    HOSPITALIZED FOR PNEUMONIA MAY 2013.  PT HAS PULMONARY CLEARANCE FROM DR. WERT FOR HERNIA REPAIR WITH DR. Michaell Cowing  . Diabetes mellitus     no medications    Past Surgical History  Procedure Date  . No past surgeries   . Inguinal hernia repair 01/30/2012    Procedure: LAPAROSCOPIC BILATERAL INGUINAL HERNIA REPAIR;  Surgeon: Ardeth Sportsman, MD;  Location: [facility];  Service: General;  Laterality: Bilateral;  . Umbilical hernia repair 01/30/2012    Procedure: HERNIA REPAIR UMBILICAL ADULT;  Surgeon: Ardeth Sportsman, MD;  Location: [facility];  Service: General;  Laterality: N/A;  Primary Umbilical Hernia Repair  . Hernia repair 01/30/2012    umb hernia    History reviewed. No pertinent family history.  Social History:  reports that he has been smoking Cigarettes.  He has a 9 pack-year smoking history. He has never used smokeless tobacco. He reports that he drinks about 1.8 ounces of alcohol per week.  He reports that he does not use illicit drugs.  Allergies:  Allergies  Allergen Reactions  . Penicillins Other (See Comments)    unknown  . Sulfa Antibiotics Other (See Comments)    unknown    Medications:  Scheduled:   . sodium chloride   Intravenous Once  . glucagon  1 mg Intravenous Once  .  morphine injection  4 mg Intravenous Once   Continuous:   . sodium chloride      Results for orders placed during the hospital encounter of 03/26/12 (from the past 24 hour(s))  GLUCOSE, CAPILLARY     Status: Normal   Collection Time   03/26/12  4:57 PM      Component Value Range   Glucose-Capillary 87  70 - 99 mg/dL     Dg Neck Soft Tissue  03/26/2012  *RADIOLOGY REPORT*  Clinical Data: 56 year old male with esophageal food impaction.  NECK SOFT TISSUES - 1+ VIEW  Comparison: Chest radiograph from the same date, reported separately.  Findings: Normal prevertebral soft tissue contour.  Normal epiglottic contour.  Mild gaseous distention of the hypopharynx, otherwise normal pharyngeal contours. Visualized tracheal air column is within normal limits.  No evidence of dilatation of the cervical esophagus.  IMPRESSION: Negative lateral neck soft tissues.   Original Report Authenticated By: Harley Hallmark, M.D.  Dg Chest 2 View  03/26/2012  *RADIOLOGY REPORT*  Clinical Data: 56 year old male with esophageal food impaction behind the sternum.  CHEST - 2 VIEW  Comparison: 12/16/2011 and earlier.  Findings: Stable and normal lung volumes.  Cardiac size and mediastinal contours are within normal limits.  Visualized tracheal air column is within normal limits.  No evidence of dilatation of the thoracic esophagus.  Lung parenchyma is stable and clear.  No pneumothorax or effusion. No acute osseous abnormality identified.  IMPRESSION: No acute cardiopulmonary abnormality.   Original Report Authenticated By: Ulla Potash III, M.D.     ROS:  As stated above in the HPI otherwise negative.  Blood pressure  139/94, pulse 94, temperature 98.2 F (36.8 C), temperature source Oral, resp. rate 17, height 5\' 7"  (1.702 m), weight 68.947 kg (152 lb), SpO2 97.00%.    PE: Gen: NAD, Alert and Oriented HEENT:  [employer]/AT, EOMI Neck: Supple, no LAD Lungs: CTA Bilaterally CV: RRR without M/G/R ABM: Soft, NTND, +BS Ext: No C/C/E  Assessment/Plan: 1) Food impaction.  Plan: 1) EGD now.  Lyndle Pang D 03/26/2012, 5:03 PM

## 2012-03-27 ENCOUNTER — Encounter (HOSPITAL_COMMUNITY): Payer: Self-pay | Admitting: Gastroenterology

## 2012-03-27 ENCOUNTER — Encounter (HOSPITAL_COMMUNITY): Payer: Self-pay

## 2012-03-30 NOTE — ED Provider Notes (Signed)
Medical screening examination/treatment/procedure(s) were performed by non-physician practitioner and as supervising physician I was immediately available for consultation/collaboration.  Jackie Littlejohn, MD 03/30/12 0011 

## 2012-07-29 ENCOUNTER — Emergency Department (HOSPITAL_COMMUNITY)
Admission: EM | Admit: 2012-07-29 | Discharge: 2012-07-29 | Disposition: A | Discharge status: Home / Self Care | Payer: Self-pay | Source: Home / Self Care | Attending: Family Medicine | Admitting: Family Medicine

## 2012-07-29 ENCOUNTER — Encounter (HOSPITAL_COMMUNITY): Payer: Self-pay

## 2012-07-29 DIAGNOSIS — I10 Essential (primary) hypertension: Secondary | ICD-10-CM

## 2012-07-29 DIAGNOSIS — F17200 Nicotine dependence, unspecified, uncomplicated: Secondary | ICD-10-CM

## 2012-07-29 MED ORDER — LOSARTAN POTASSIUM 25 MG PO TABS: 25.0000 mg | ORAL_TABLET | Freq: Every day | ORAL | Status: DC

## 2012-07-29 NOTE — ED Provider Notes (Signed)
History   CSN: [id number]  Arrival date & time 07/29/12  1112   First MD Initiated Contact with Patient 07/29/12 1150     Chief Complaint  Patient presents with  . Medication Refill   HPI Patient presented today to get refills on his medications.  He reports that he's been having chronic cough associated with lisinopril.  He reports that he has seen a pulmonologist at put him on Benicar by giving him samples of that but he was not able to afford the medication because it was so expensive and he doesn't have medical insurance.  He reports that he still having a chronic dry cough.  He is also a smoker.  He would like to try a generic form of Benicar.  He is reporting that he has taken losartan in the past.  He otherwise reports that he is doing well and not having any problems.   Past Medical History  Diagnosis Date  . Hypertension   . Borderline diabetic     diet controlled, pt does not check cbg  . Hernia, inguinal, bilateral     current  . Rash  since 7-8-201    red rash around bellybutton and belt line and both feet  - resolved  . Pneumonia 10-2011    HOSPITALIZED FOR PNEUMONIA MAY 2013.  PT HAS PULMONARY CLEARANCE FROM DR. WERT FOR HERNIA REPAIR WITH DR. Michaell Cowing  . Diabetes mellitus     no medications    Past Surgical History  Procedure Date  . No past surgeries   . Inguinal hernia repair 01/30/2012    Procedure: LAPAROSCOPIC BILATERAL INGUINAL HERNIA REPAIR;  Surgeon: Ardeth Sportsman, MD;  Location: [facility];  Service: General;  Laterality: Bilateral;  . Umbilical hernia repair 01/30/2012    Procedure: HERNIA REPAIR UMBILICAL ADULT;  Surgeon: Ardeth Sportsman, MD;  Location: [facility];  Service: General;  Laterality: N/A;  Primary Umbilical Hernia Repair  . Hernia repair 01/30/2012    umb hernia  . Esophagogastroduodenoscopy 03/26/2012    Procedure: ESOPHAGOGASTRODUODENOSCOPY (EGD);  Surgeon: Theda Belfast, MD;  Location: [facility] ENDOSCOPY;  Service: Endoscopy;  Laterality: N/A;    No family  history on file.  History  Substance Use Topics  . Smoking status: Current Some Day Smoker -- 0.3 packs/day for 30 years    Types: Cigarettes  . Smokeless tobacco: Never Used  . Alcohol Use: 1.8 oz/week    3 Cans of beer per week    Review of Systems  Respiratory: Positive for cough.   All other systems reviewed and are negative.    Allergies  Penicillins and Sulfa antibiotics  Home Medications   Current Outpatient Rx  Name  Route  Sig  Dispense  Refill  . HYDROCORTISONE 2.5 % EX CREA   Topical   Apply 1 application topically daily.          Marland Kitchen LISINOPRIL 20 MG PO TABS   Oral   Take 20 mg by mouth daily.         Marland Kitchen OMEPRAZOLE 40 MG PO CPDR   Oral   Take 1 capsule (40 mg total) by mouth daily.   30 capsule   10    BP 125/86  Pulse 73  Temp 98.4 F (36.9 C) (Oral)  Resp 16  SpO2 100%  Physical Exam  Nursing note and vitals reviewed. Constitutional: He is oriented to person, place, and time. He appears well-developed and well-nourished. No distress.  HENT:  Head: Normocephalic and atraumatic.  Eyes: Conjunctivae normal and EOM are normal. Pupils are equal, round, and reactive to light.  Neck: Normal range of motion. Neck supple. No JVD present. No thyromegaly present.  Cardiovascular: Normal rate, regular rhythm and normal heart sounds.   Pulmonary/Chest: Effort normal and breath sounds normal. No respiratory distress. He has no wheezes. He has no rales. He exhibits no tenderness.  Abdominal: Soft. Bowel sounds are normal.  Musculoskeletal: Normal range of motion. He exhibits no edema and no tenderness.  Lymphadenopathy:    He has no cervical adenopathy.  Neurological: He is alert and oriented to person, place, and time.  Skin: Skin is warm and dry.  Psychiatric: He has a normal mood and affect. His behavior is normal. Judgment and thought content normal.    ED Course  Procedures (including critical care time)  Labs Reviewed - No data to display No  results found.  Oh no masses are active hospital No diagnosis found.  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  If the patient continues to have cough I gave him strict instructions to return to clinic so that we can do further workup including a chest x-ray.  He verbalized understanding.  FOLLOW UP 3 months  The patient was given clear instructions to go to ER or return to medical center if symptoms don't improve, worsen or new problems develop.  The patient verbalized understanding.  The patient was told to call to get lab results if they haven't heard anything in the next week.            Cleora Fleet, MD 07/29/12 2018

## 2012-07-29 NOTE — ED Notes (Signed)
Medication refill- history of HTN 

## 2012-08-07 ENCOUNTER — Encounter (HOSPITAL_COMMUNITY): Payer: Self-pay

## 2012-08-07 ENCOUNTER — Emergency Department (HOSPITAL_COMMUNITY)
Admission: EM | Admit: 2012-08-07 | Discharge: 2012-08-07 | Disposition: A | Discharge status: Home / Self Care | Payer: Self-pay | Source: Home / Self Care

## 2012-08-07 DIAGNOSIS — F17200 Nicotine dependence, unspecified, uncomplicated: Secondary | ICD-10-CM | POA: Diagnosis present

## 2012-08-07 DIAGNOSIS — J209 Acute bronchitis, unspecified: Secondary | ICD-10-CM | POA: Diagnosis present

## 2012-08-07 DIAGNOSIS — I10 Essential (primary) hypertension: Secondary | ICD-10-CM | POA: Diagnosis present

## 2012-08-07 MED ORDER — ZITHROMAX Z-PAK 250 MG PO TABS: ORAL_TABLET | ORAL | Status: DC

## 2012-08-07 NOTE — ED Provider Notes (Signed)
History     CSN: [id number]  Arrival date & time 08/07/12  1136   First MD Initiated Contact with Patient 08/07/12 1148      Chief Complaint  Patient presents with  . Sore Throat    (Consider location/radiation/quality/duration/timing/severity/associated sxs/prior treatment) HPI Patient has been having cough with sputum production which is clear to yellowish for last 4-5 days.  Patient does not have fevers. He smokes 1 pack a day and trying to quit. Patient does not use any inhalers at this time. He does not have wheezing.   Past Medical History  Diagnosis Date  . Hypertension   . Borderline diabetic     diet controlled, pt does not check cbg  . Hernia, inguinal, bilateral     current  . Rash  since 7-8-201    red rash around bellybutton and belt line and both feet  - resolved  . Pneumonia 10-2011    HOSPITALIZED FOR PNEUMONIA MAY 2013.  PT HAS PULMONARY CLEARANCE FROM DR. WERT FOR HERNIA REPAIR WITH DR. Michaell Cowing  . Diabetes mellitus     no medications    Past Surgical History  Procedure Laterality Date  . No past surgeries    . Inguinal hernia repair  01/30/2012    Procedure: LAPAROSCOPIC BILATERAL INGUINAL HERNIA REPAIR;  Surgeon: Ardeth Sportsman, MD;  Location: [facility];  Service: General;  Laterality: Bilateral;  . Umbilical hernia repair  01/30/2012    Procedure: HERNIA REPAIR UMBILICAL ADULT;  Surgeon: Ardeth Sportsman, MD;  Location: [facility];  Service: General;  Laterality: N/A;  Primary Umbilical Hernia Repair  . Hernia repair  01/30/2012    umb hernia  . Esophagogastroduodenoscopy  03/26/2012    Procedure: ESOPHAGOGASTRODUODENOSCOPY (EGD);  Surgeon: Theda Belfast, MD;  Location: [facility] END[facility]COPY;  Service: Endoscopy;  Laterality: N/A;    No family history on file.  History  Substance Use Topics  . Smoking status: Current Some Day Smoker -- 0.30 packs/day for 30 years    Types: Cigarettes  . Smokeless tobacco: Never Used  . Alcohol Use: 1.8 oz/week    3 Cans of beer  per week      Review of Systems  Allergies  Penicillins and Sulfa antibiotics  Home Medications   Current Outpatient Rx  Name  Route  Sig  Dispense  Refill  . losartan (COZAAR) 25 MG tablet   Oral   Take 1 tablet (25 mg total) by mouth daily.   30 tablet   4     BP 136/85  Pulse 78  Temp(Src) 99.3 F (37.4 C) (Oral)  SpO2 97%  Physical Exam Physical Exam: General: Vital signs reviewed and noted. Well-developed, well-nourished, in no acute distress; alert, appropriate and cooperative throughout examination.  Head: Normocephalic, atraumatic.  Eyes: PERRL, EOMI, No signs of anemia or jaundince.  Nose: Mucous membranes moist, not inflammed, nonerythematous.  Throat: Oropharynx erythematous but no exudate appreciated.   Neck: No deformities, masses, or tenderness noted.Supple, No carotid Bruits, no JVD.  Lungs:  Normal respiratory effort. Clear to auscultation BL without crackles or wheezes.  Heart: RRR. S1 and S2 normal without gallop, murmur, or rubs.  Abdomen:  BS normoactive. Soft, Nondistended, non-tender.  No masses or organomegaly.  Extremities: No pretibial edema.  Neurologic: A&O X3, CN II - XII are grossly intact. Motor strength is 5/5 in the all 4 extremities, Sensations intact to light touch, Cerebellar signs negative.  Skin: No visible rashes, scars.     ED Course  Procedures (including critical care time)  Labs Reviewed - No data to display No results found.   No diagnosis found.  Patient Active Problem List  Diagnosis  . Smoker  . Hypertension  . Bronchitis, acute     MDM  1. Azithromycin on patient's request as he cannot come back again if this gets worse. 2. Steam inhalation 2-3 times a day, smoking cessation. 3. Cough suppressant. 4. Counselled for smoking cessation extensively. 5. Follow up if not better.        Lars Mage, MD 08/07/12 1233

## 2012-08-07 NOTE — ED Notes (Signed)
Patient states here today for sore throat. Coughing up phlem

## 2012-08-31 ENCOUNTER — Other Ambulatory Visit: Payer: Self-pay | Admitting: Surgery

## 2012-08-31 ENCOUNTER — Emergency Department (HOSPITAL_COMMUNITY)
Admission: EM | Admit: 2012-08-31 | Discharge: 2012-08-31 | Disposition: A | Discharge status: Home / Self Care | Payer: Self-pay | Source: Home / Self Care

## 2012-08-31 ENCOUNTER — Encounter (HOSPITAL_COMMUNITY): Payer: Self-pay

## 2012-08-31 ENCOUNTER — Emergency Department (INDEPENDENT_AMBULATORY_CARE_PROVIDER_SITE_OTHER): Payer: Self-pay

## 2012-08-31 DIAGNOSIS — R05 Cough: Secondary | ICD-10-CM

## 2012-08-31 DIAGNOSIS — R059 Cough, unspecified: Secondary | ICD-10-CM

## 2012-08-31 DIAGNOSIS — I10 Essential (primary) hypertension: Secondary | ICD-10-CM

## 2012-08-31 IMAGING — CR DG CHEST 2V
2 series · 2 of 2 positions shown · non-contrast
Comparison: [DATE].

CLINICAL DATA: Cough.

CHEST - 2 VIEW

[view not recorded (1 of 2)]
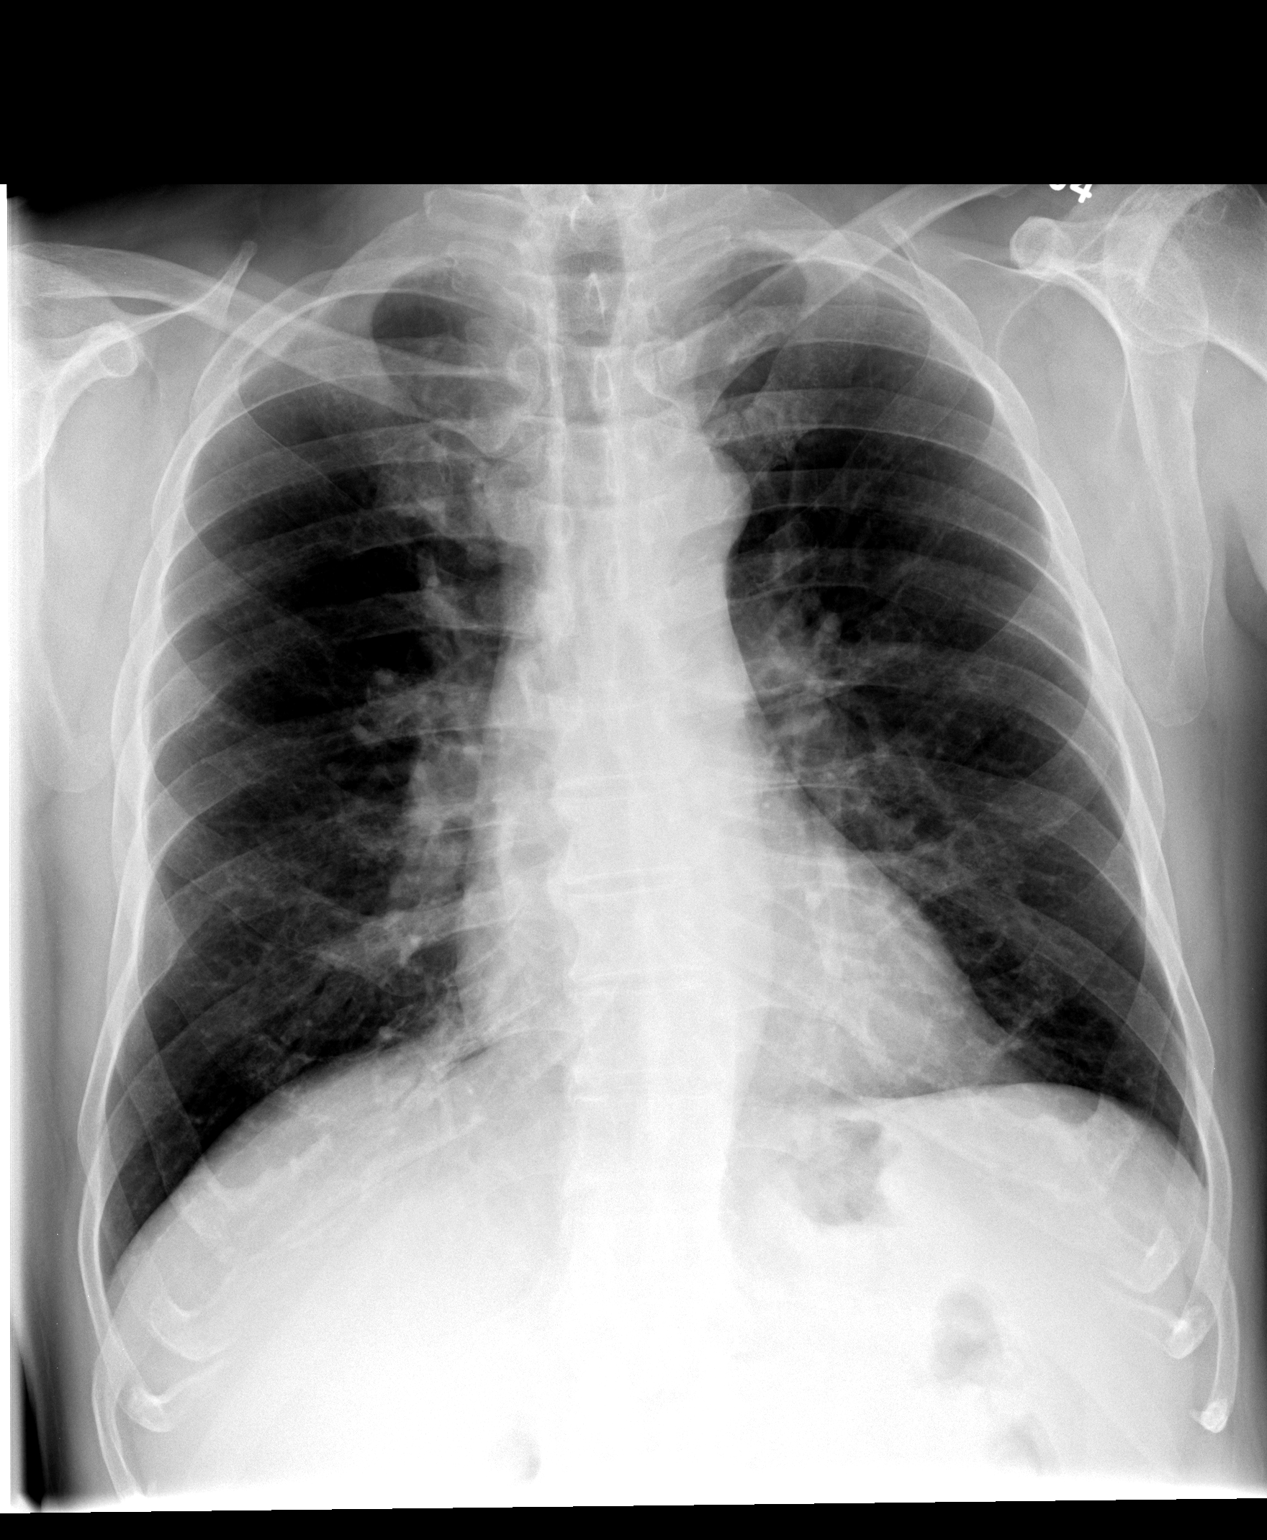

[view not recorded (2 of 2)]
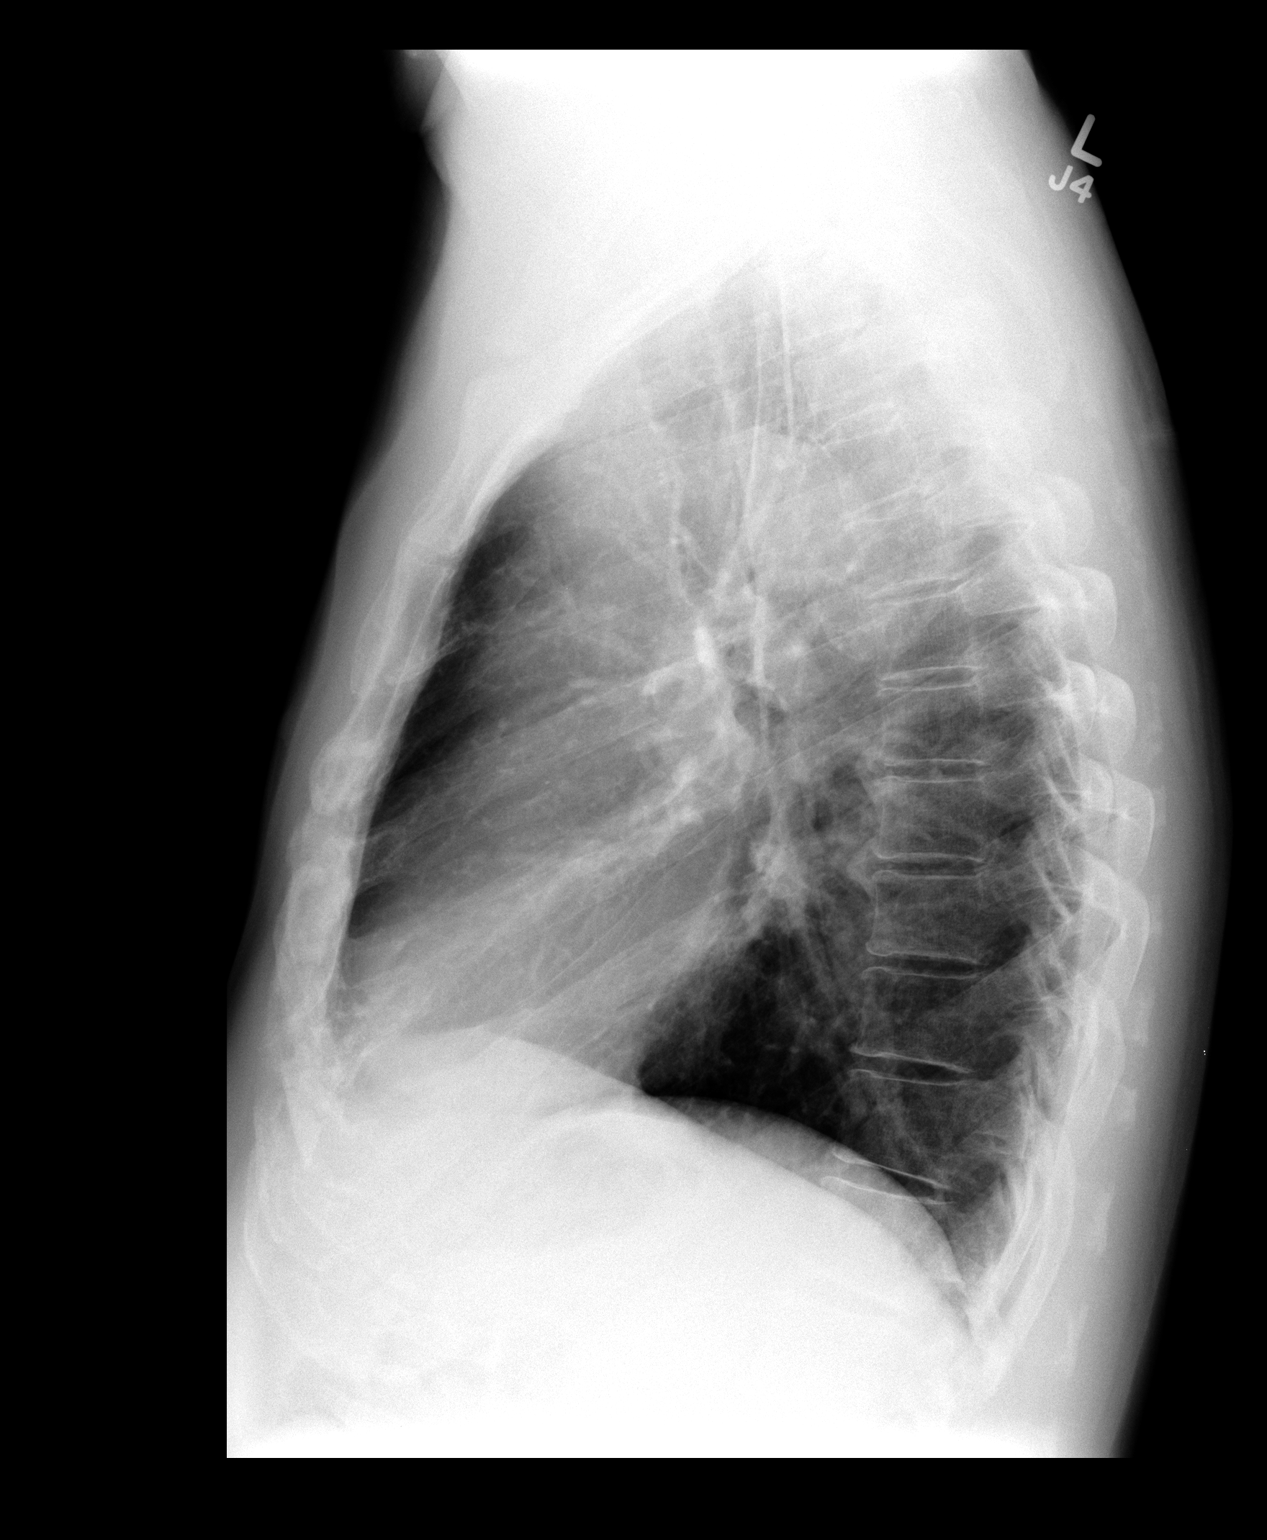

[2 of 2 positions shown; findings below may reference images not displayed]

FINDINGS: Cardiomediastinal silhouette appears normal.  No acute
pulmonary disease is noted.  Bony thorax is intact.
IMPRESSION: No acute cardiopulmonary abnormality seen.

## 2012-08-31 MED ORDER — HYDROCHLOROTHIAZIDE 25 MG PO TABS: 25.0000 mg | ORAL_TABLET | Freq: Every day | ORAL | Status: DC

## 2012-08-31 NOTE — ED Provider Notes (Addendum)
History     CSN: [id number]  Arrival date & time 08/31/12  1225   None     Chief Complaint  Patient presents with  . Follow-up    (Consider location/radiation/quality/duration/timing/severity/associated sxs/prior treatment) HPI Mr. Hellwig comes in today for a re-evaluation of his BP.  He says he was taking lisinopril, but was changed to Cozaar due to concerns that the lisinopril was aggravating his cough.  He continues to smoke 3-4 cigarettes a day. The patient continues to have a cough productive of blood tinged sputum, mostly clear.  Recently finished a Z-Pack for bronchitis.   The patient prefers not to go up on his dose of Cozaar as when he went up on his dose of lisinopril it made him extremely tired and he is afraid of this side effect with going up on the Cozaar. He does agree to try adding a second medication.  Past Medical History  Diagnosis Date  . Hypertension   . Borderline diabetic     diet controlled, pt does not check cbg  . Hernia, inguinal, bilateral     current  . Rash  since 7-8-201    red rash around bellybutton and belt line and both feet  - resolved  . Pneumonia 10-2011    HOSPITALIZED FOR PNEUMONIA MAY 2013.  PT HAS PULMONARY CLEARANCE FROM DR. WERT FOR HERNIA REPAIR WITH DR. Michaell Cowing  . Diabetes mellitus     no medications    Past Surgical History  Procedure Laterality Date  . No past surgeries    . Inguinal hernia repair  01/30/2012    Procedure: LAPAROSCOPIC BILATERAL INGUINAL HERNIA REPAIR;  Surgeon: Ardeth Sportsman, MD;  Location: [facility];  Service: General;  Laterality: Bilateral;  . Umbilical hernia repair  01/30/2012    Procedure: HERNIA REPAIR UMBILICAL ADULT;  Surgeon: Ardeth Sportsman, MD;  Location: [facility];  Service: General;  Laterality: N/A;  Primary Umbilical Hernia Repair  . Hernia repair  01/30/2012    umb hernia  . Esophagogastroduodenoscopy  03/26/2012    Procedure: ESOPHAGOGASTRODUODENOSCOPY (EGD);  Surgeon: Theda Belfast, MD;  [facility] END[facility]COPY;  Service: Endoscopy;  Laterality: N/A;    No family history on file.  History  Substance Use Topics  . Smoking status: Current Some Day Smoker -- 0.30 packs/day for 30 years    Types: Cigarettes  . Smokeless tobacco: Never Used  . Alcohol Use: 1.8 oz/week    3 Cans of beer per week      Review of Systems No F/C, no weight loss, no SOB.  No chest pain.  All other systems reviewed and are negative except as noted in history of present illness above.  Allergies  Penicillins and Sulfa antibiotics  Home Medications   Current Outpatient Rx  Name  Route  Sig  Dispense  Refill  . losartan (COZAAR) 25 MG tablet   Oral   Take 1 tablet (25 mg total) by mouth daily.   30 tablet   4   . ZITHROMAX Z-PAK 250 MG tablet      Take 2 tabs today and then 1 tab every day for next 4 days.   6 each   0     Dispense as written.     BP 151/95  Pulse 107  Temp(Src) 98.2 F (36.8 C) (Oral)  SpO2 97%  Physical Exam General: No acute distress. HEENT: Normocephalic, atraumatic. PERRLA. EOMI. Oropharynx is clear with moist mucous membranes. Neck: Supple, no thyromegaly, no lymphadenopathy,  no jugular venous distention. Chest: Diminished breath sounds but clear bilaterally. Heart: Regular rate, and rhythm. No murmurs, rubs, or gallops. Abdomen: Soft, nontender, nondistended with normal active bowel sounds. Extremities: No clubbing, edema, or cyanosis. Skin: Warm and dry. Psychiatric: Mood and affect slightly depressed.  ED Course  Procedures (including critical care time)  Labs Reviewed - No data to display No results found.   No diagnosis found.    MDM  1. Hypertension: Continue Cozaar. Add hydrochlorothiazide. Return to clinic in one month for an evaluation of electrolytes and blood pressure. 2. Cough: Likely from residual bronchitis but given the fact that he is a smoker and at high-risk for lung malignancy, will get a chest x-ray today.  Addendum: CXR  personally reviewed.  Clear.  No evidence of pneumonia or malignancy.       Maryruth Bun Rama, MD 08/31/12 1346  Maryruth Bun Rama, MD 08/31/12 [phone]

## 2012-08-31 NOTE — ED Notes (Signed)
Follow up-HTN and blood work

## 2012-09-09 ENCOUNTER — Other Ambulatory Visit: Payer: Self-pay | Admitting: Dermatology

## 2013-10-29 ENCOUNTER — Ambulatory Visit: Payer: Self-pay | Admitting: Internal Medicine

## 2014-11-24 ENCOUNTER — Encounter: Payer: Self-pay | Admitting: Internal Medicine

## 2017-06-09 ENCOUNTER — Other Ambulatory Visit: Payer: Self-pay

## 2017-06-09 ENCOUNTER — Encounter (HOSPITAL_COMMUNITY): Payer: Self-pay | Admitting: Neurology

## 2017-06-09 ENCOUNTER — Emergency Department (HOSPITAL_COMMUNITY): Payer: Self-pay

## 2017-06-09 ENCOUNTER — Emergency Department (HOSPITAL_COMMUNITY)
Admission: EM | Admit: 2017-06-09 | Discharge: 2017-06-09 | Disposition: A | Discharge status: Home / Self Care | Payer: Self-pay | Attending: Emergency Medicine | Admitting: Emergency Medicine

## 2017-06-09 DIAGNOSIS — I10 Essential (primary) hypertension: Secondary | ICD-10-CM | POA: Insufficient documentation

## 2017-06-09 DIAGNOSIS — F17210 Nicotine dependence, cigarettes, uncomplicated: Secondary | ICD-10-CM | POA: Insufficient documentation

## 2017-06-09 DIAGNOSIS — E119 Type 2 diabetes mellitus without complications: Secondary | ICD-10-CM | POA: Insufficient documentation

## 2017-06-09 DIAGNOSIS — R042 Hemoptysis: Secondary | ICD-10-CM | POA: Insufficient documentation

## 2017-06-09 DIAGNOSIS — Z79899 Other long term (current) drug therapy: Secondary | ICD-10-CM | POA: Insufficient documentation

## 2017-06-09 LAB — I-STAT CHEM 8, ED
BUN: 15 mg/dL (ref 6–20)
CALCIUM ION: 1.15 mmol/L (ref 1.15–1.40)
CHLORIDE: 101 mmol/L (ref 101–111)
Creatinine, Ser: 0.9 mg/dL (ref 0.61–1.24)
Glucose, Bld: 136 mg/dL — ABNORMAL HIGH (ref 65–99)
HCT: 52 % (ref 39.0–52.0)
Hemoglobin: 17.7 g/dL — ABNORMAL HIGH (ref 13.0–17.0)
Potassium: 3.8 mmol/L (ref 3.5–5.1)
SODIUM: 139 mmol/L (ref 135–145)
TCO2: 26 mmol/L (ref 22–32)

## 2017-06-09 IMAGING — DX DG CHEST 2V
2 series · 2 of 2 positions shown · non-contrast
Comparison: [DATE].

CLINICAL DATA: Hemoptysis.

EXAM:
CHEST  2 VIEW

[w chest pa]
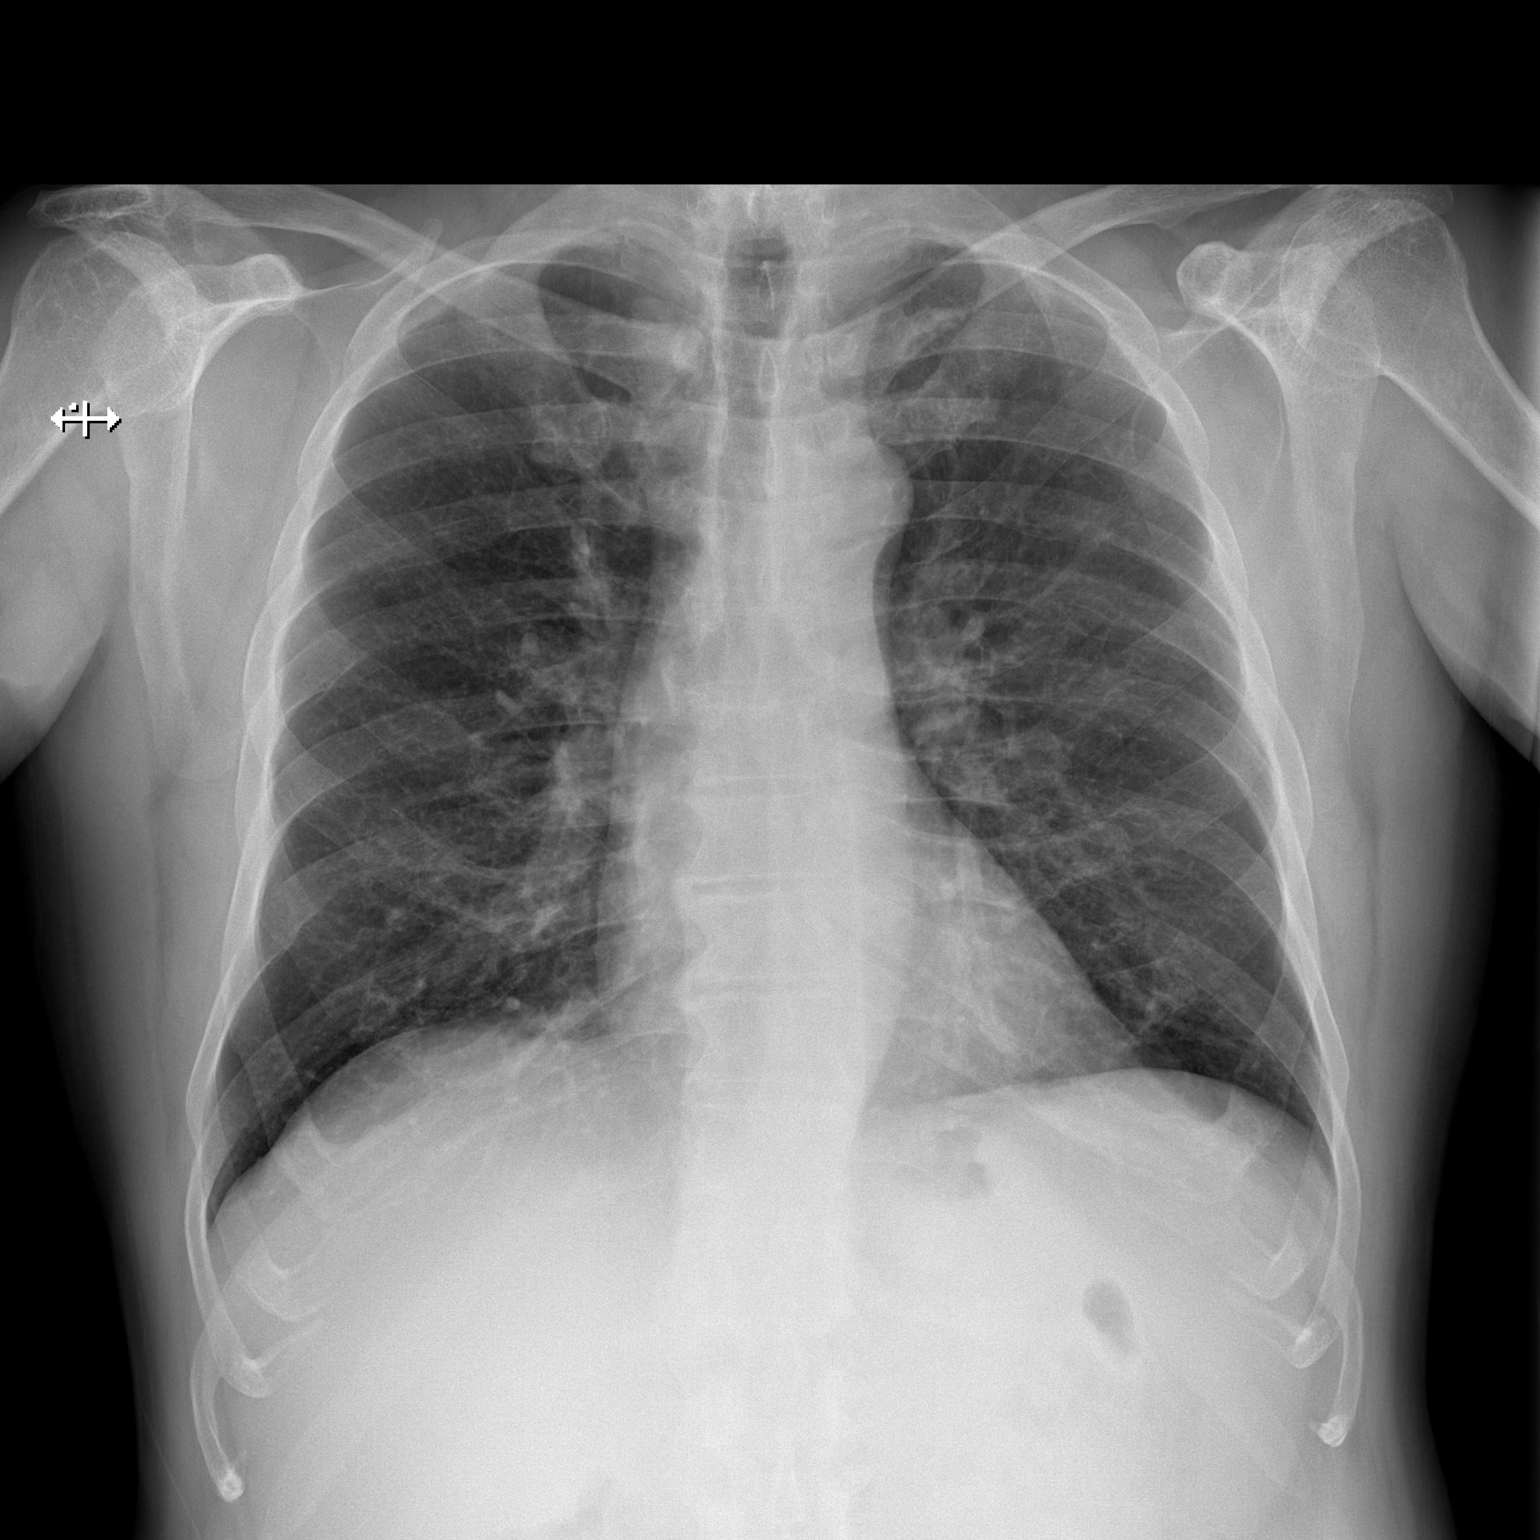

[w chest lat]
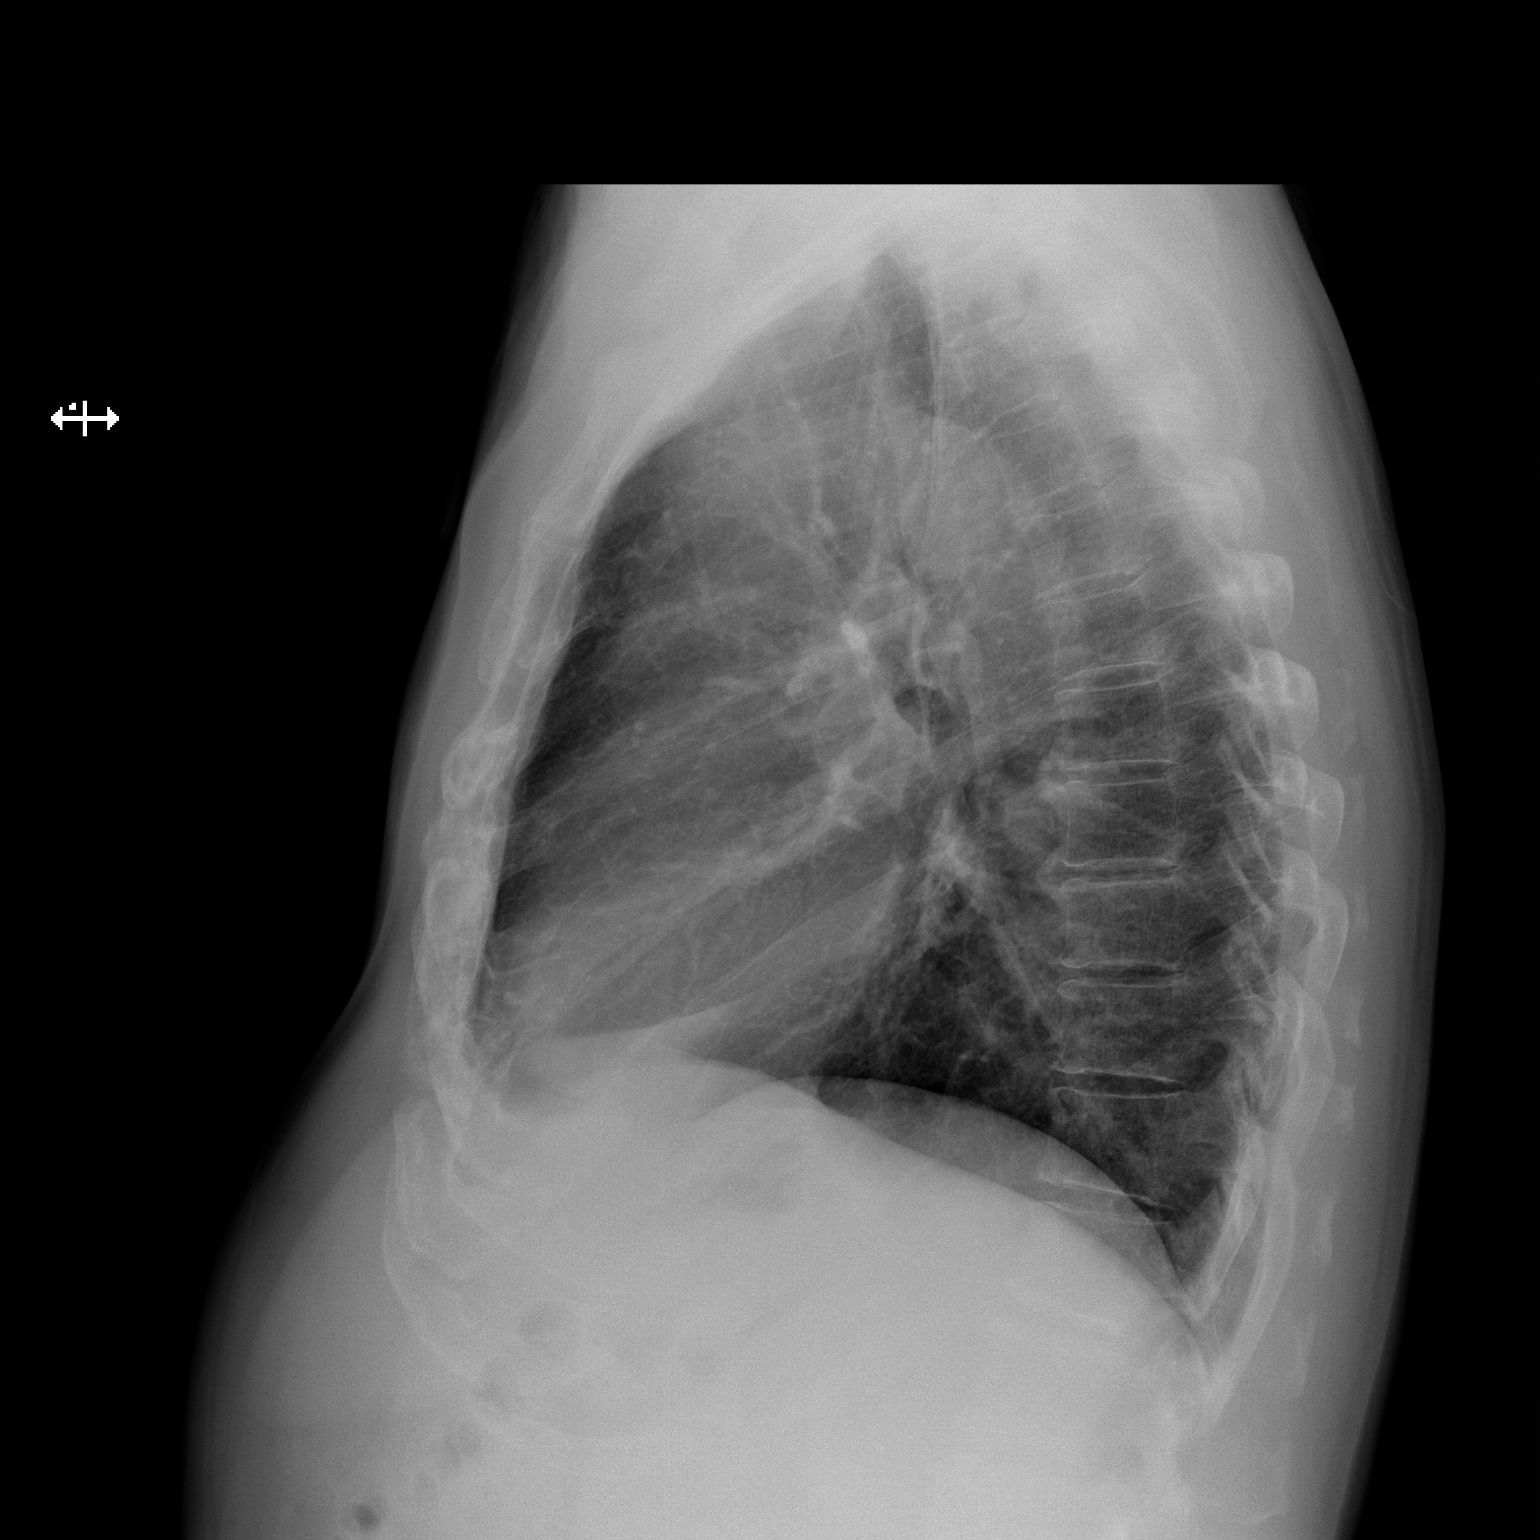

[2 of 2 positions shown; findings below may reference images not displayed]

FINDINGS: The heart size and mediastinal contours are within normal limits.
Both lungs are clear. The visualized skeletal structures are
unremarkable.
IMPRESSION: No active cardiopulmonary disease.

## 2017-06-09 MED ORDER — ALBUTEROL SULFATE HFA 108 (90 BASE) MCG/ACT IN AERS
2.0000 | INHALATION_SPRAY | Freq: Four times a day (QID) | RESPIRATORY_TRACT | Status: DC
  Administered 2017-06-09: 2 via RESPIRATORY_TRACT
  Filled 2017-06-09: qty 6.7

## 2017-06-09 MED ORDER — PREDNISONE 20 MG PO TABS
60.0000 mg | ORAL_TABLET | ORAL | Status: AC
  Administered 2017-06-09: 60 mg via ORAL
  Filled 2017-06-09: qty 3

## 2017-06-09 MED ORDER — PREDNISONE 20 MG PO TABS: 40.0000 mg | ORAL_TABLET | Freq: Every day | ORAL | 0 refills | Status: DC

## 2017-06-09 NOTE — Discharge Instructions (Signed)
As discussed, your evaluation today has been largely reassuring.  But, it is important that you monitor your condition carefully, and do not hesitate to return to the ED if you develop new, or concerning changes in your condition.  Please use the provided albuterol inhaler every 4 hours for the next 3 days. Please take the prescribed medication as directed. Please use the provided information to follow-up with our affiliated primary care center.

## 2017-06-09 NOTE — ED Provider Notes (Signed)
[facility] Provider Note   CSN: [id number] Arrival date & time: 06/09/17  3825     History   Chief Complaint Chief Complaint  Patient presents with  . Hemoptysis    HPI Levi Conner is a 61 y.o. male.  HPI Patient presents with concern of hemoptysis. Patient notes that over the past 3 days he has had multiple episodes of hemoptysis, without lightheadedness, syncope, chest pain. Patient states that he has a history of hypertension, but has had no medical care for several years. He also smokes cigarettes. Given his new hemoptysis, is concerned about possible infection and/or malignancy.  Past Medical History:  Diagnosis Date  . Borderline diabetic    diet controlled, pt does not check cbg  . Diabetes mellitus    no medications  . Hernia, inguinal, bilateral    current  . Hypertension   . Pneumonia 10-2011   HOSPITALIZED FOR PNEUMONIA MAY 2013.  PT HAS PULMONARY CLEARANCE FROM DR. WERT FOR HERNIA REPAIR WITH DR. Johney Maine  . Rash  since 7-8-201   red rash around bellybutton and belt line and both feet  - resolved    Patient Active Problem List   Diagnosis Date Noted  . Bronchitis, acute 08/07/2012  . Hypertension 12/16/2011  . Bilateral inguinal hernia (BIH) 11/26/2011  . Umbilical hernia 05/39/7673  . Internal prolapsed hemorrhoids 11/26/2011  . Smoker 10/31/2011    Past Surgical History:  Procedure Laterality Date  . ESOPHAGOGASTRODUODENOSCOPY  03/26/2012   Procedure: ESOPHAGOGASTRODUODENOSCOPY (EGD);  Surgeon: Beryle Beams, MD;  Location: [facility] ENDOSCOPY;  Service: Endoscopy;  Laterality: N/A;  . HERNIA REPAIR  01/30/2012   umb hernia  . INGUINAL HERNIA REPAIR  01/30/2012   Procedure: LAPAROSCOPIC BILATERAL INGUINAL HERNIA REPAIR;  Surgeon: Adin Hector, MD;  Location: [facility] ORS;  Service: General;  Laterality: Bilateral;  . NO PAST SURGERIES    . UMBILICAL HERNIA REPAIR  01/30/2012   Procedure: HERNIA REPAIR UMBILICAL  ADULT;  Surgeon: Adin Hector, MD;  Location: [facility] ORS;  Service: General;  Laterality: N/A;  Primary Umbilical Hernia Repair       Home Medications    Prior to Admission medications   Medication Sig Start Date End Date Taking? Authorizing Provider  hydrocortisone 2.5 % cream Apply 1 application topically daily. 04/05/17  Yes [provider]  naproxen sodium (ALEVE) 220 MG tablet Take 440 mg by mouth daily as needed (Headaches, Bodyaches).   Yes [provider]  PRESCRIPTION MEDICATION Apply 1 application topically daily. Topical Cream; Apply to the face daily for Rosacea.   Yes [provider]  hydrochlorothiazide (HYDRODIURIL) 25 MG tablet Take 1 tablet (25 mg total) by mouth daily. Patient not taking: Reported on 06/09/2017 08/31/12   Rama, Venetia Maxon, MD  losartan (COZAAR) 25 MG tablet Take 1 tablet (25 mg total) by mouth daily. Patient not taking: Reported on 06/09/2017 07/29/12   Murlean Iba, MD  ZITHROMAX Z-PAK 250 MG tablet Take 2 tabs today and then 1 tab every day for next 4 days. Patient not taking: Reported on 06/09/2017 08/07/12   Janell Quiet, MD    Family History No family history on file.  Social History Social History   Tobacco Use  . Smoking status: Current Some Day Smoker    Packs/day: 0.30    Years: 30.00    Pack years: 9.00    Types: Cigarettes  . Smokeless tobacco: Never Used  Substance Use Topics  . Alcohol use:  Yes    Alcohol/week: 1.8 oz    Types: 3 Cans of beer per week  . Drug use: No     Allergies   Penicillins and Sulfa antibiotics   Review of Systems Review of Systems  Constitutional:       Per HPI, otherwise negative  HENT:       Per HPI, otherwise negative  Respiratory:       Per HPI, otherwise negative  Cardiovascular:       Per HPI, otherwise negative  Gastrointestinal: Negative for vomiting.  Endocrine:       Negative aside from HPI  Genitourinary:       Neg aside from HPI     Musculoskeletal:       Per HPI, otherwise negative  Skin: Negative.   Neurological: Negative for syncope.     Physical Exam Updated Vital Signs BP (!) 165/100 (BP Location: Right Arm)   Pulse 86   Temp (!) 97.2 F (36.2 C) (Oral)   Resp 17   Ht 5\' 7"  (1.702 m)   Wt 68 kg (150 lb)   SpO2 95%   BMI 23.49 kg/m   Physical Exam  Constitutional: He is oriented to person, place, and time. He appears well-developed. No distress.  HENT:  Head: Normocephalic and atraumatic.  Eyes: Conjunctivae and EOM are normal.  Cardiovascular: Normal rate and regular rhythm.  Pulmonary/Chest: Effort normal. No stridor. No respiratory distress.  Abdominal: He exhibits no distension.  Musculoskeletal: He exhibits no edema.  Neurological: He is alert and oriented to person, place, and time.  Skin: Skin is warm and dry.  Psychiatric: He has a normal mood and affect.  Nursing note and vitals reviewed.    ED Treatments / Results  Labs (all labs ordered are listed, but only abnormal results are displayed) Labs Reviewed  I-STAT CHEM 8, ED - Abnormal; Notable for the following components:      Result Value   Glucose, Bld 136 (*)    Hemoglobin 17.7 (*)    All other components within normal limits     Radiology Dg Chest 2 View  Result Date: 06/09/2017 CLINICAL DATA:  Hemoptysis. EXAM: CHEST  2 VIEW COMPARISON:  08/31/2012. FINDINGS: The heart size and mediastinal contours are within normal limits. Both lungs are clear. The visualized skeletal structures are unremarkable. IMPRESSION: No active cardiopulmonary disease. Electronically Signed   By: Staci Righter M.D.   On: 06/09/2017 10:14    Procedures Procedures (including critical care time)  Medications Ordered in ED Medications - No data to display   Initial Impression / Assessment and Plan / ED Course  I have reviewed the triage vital signs and the nursing notes.  Pertinent labs & imaging results that were available during my care  of the patient were reviewed by me and considered in my medical decision making (see chart for details).  On repeat exam the patient is awake and alert.  Vital signs unremarkable.  Labs unremarkable, with no anemia.  X-ray does not demonstrate notable findings, and I demonstrated the pictures to the patient and we discussed its significance.  This 61 year old male with tobacco abuse history presents with concern hemoptysis. Patient awake, alert, no distress, hemodynamic is stable, no anemia, no evidence for substantial blood loss, no evidence for infection, some suspicion for inflammatory condition contributing to his cough, hemoptysis. Patient received steroids, albuterol, referral to our primary care center for further evaluation and management.  Final Clinical Impressions(s) / ED Diagnoses  Hemoptysis  Carmin Muskrat, MD 06/09/17 [phone]

## 2017-06-09 NOTE — ED Triage Notes (Signed)
Pt reports the last 3 mornings has been coughing up blood, is small amount only in the morning. Is a smoker, he doesn't have insurance, wants to know about programs available for him. Also, has been having leg cramps at night x 6 months.

## 2017-06-09 NOTE — ED Notes (Signed)
Pt ambulated to room from waiting room, tolerated well. 

## 2018-04-17 ENCOUNTER — Other Ambulatory Visit: Payer: Self-pay

## 2018-04-17 ENCOUNTER — Encounter (HOSPITAL_COMMUNITY): Payer: Self-pay | Admitting: *Deleted

## 2018-04-17 ENCOUNTER — Emergency Department (HOSPITAL_COMMUNITY): Payer: BLUE CROSS/BLUE SHIELD

## 2018-04-17 ENCOUNTER — Emergency Department (HOSPITAL_COMMUNITY)
Admission: EM | Admit: 2018-04-17 | Discharge: 2018-04-17 | Disposition: A | Discharge status: Home / Self Care | Payer: BLUE CROSS/BLUE SHIELD | Attending: Emergency Medicine | Admitting: Emergency Medicine

## 2018-04-17 DIAGNOSIS — Z79899 Other long term (current) drug therapy: Secondary | ICD-10-CM | POA: Insufficient documentation

## 2018-04-17 DIAGNOSIS — I10 Essential (primary) hypertension: Secondary | ICD-10-CM | POA: Diagnosis not present

## 2018-04-17 DIAGNOSIS — S2232XA Fracture of one rib, left side, initial encounter for closed fracture: Secondary | ICD-10-CM

## 2018-04-17 DIAGNOSIS — E119 Type 2 diabetes mellitus without complications: Secondary | ICD-10-CM | POA: Diagnosis not present

## 2018-04-17 DIAGNOSIS — F17210 Nicotine dependence, cigarettes, uncomplicated: Secondary | ICD-10-CM | POA: Insufficient documentation

## 2018-04-17 DIAGNOSIS — W1839XA Other fall on same level, initial encounter: Secondary | ICD-10-CM | POA: Diagnosis not present

## 2018-04-17 DIAGNOSIS — Y999 Unspecified external cause status: Secondary | ICD-10-CM | POA: Insufficient documentation

## 2018-04-17 DIAGNOSIS — Z720 Tobacco use: Secondary | ICD-10-CM

## 2018-04-17 DIAGNOSIS — Y9389 Activity, other specified: Secondary | ICD-10-CM | POA: Insufficient documentation

## 2018-04-17 DIAGNOSIS — R0781 Pleurodynia: Secondary | ICD-10-CM | POA: Diagnosis not present

## 2018-04-17 DIAGNOSIS — S299XXA Unspecified injury of thorax, initial encounter: Secondary | ICD-10-CM | POA: Diagnosis not present

## 2018-04-17 DIAGNOSIS — S25809A Unspecified injury of other blood vessels of thorax, unspecified side, initial encounter: Secondary | ICD-10-CM | POA: Diagnosis not present

## 2018-04-17 DIAGNOSIS — Y929 Unspecified place or not applicable: Secondary | ICD-10-CM | POA: Insufficient documentation

## 2018-04-17 IMAGING — DX DG RIBS W/ CHEST 3+V*L*
3 series · 3 of 3 positions shown · non-contrast
Comparison: Chest radiograph [DATE]

CLINICAL DATA: Fell off truck [REDACTED]. Hit ground on left side.
Left rib pain. Smoking history.

EXAM:
LEFT RIBS AND CHEST - 3+ VIEW

[chest pa]
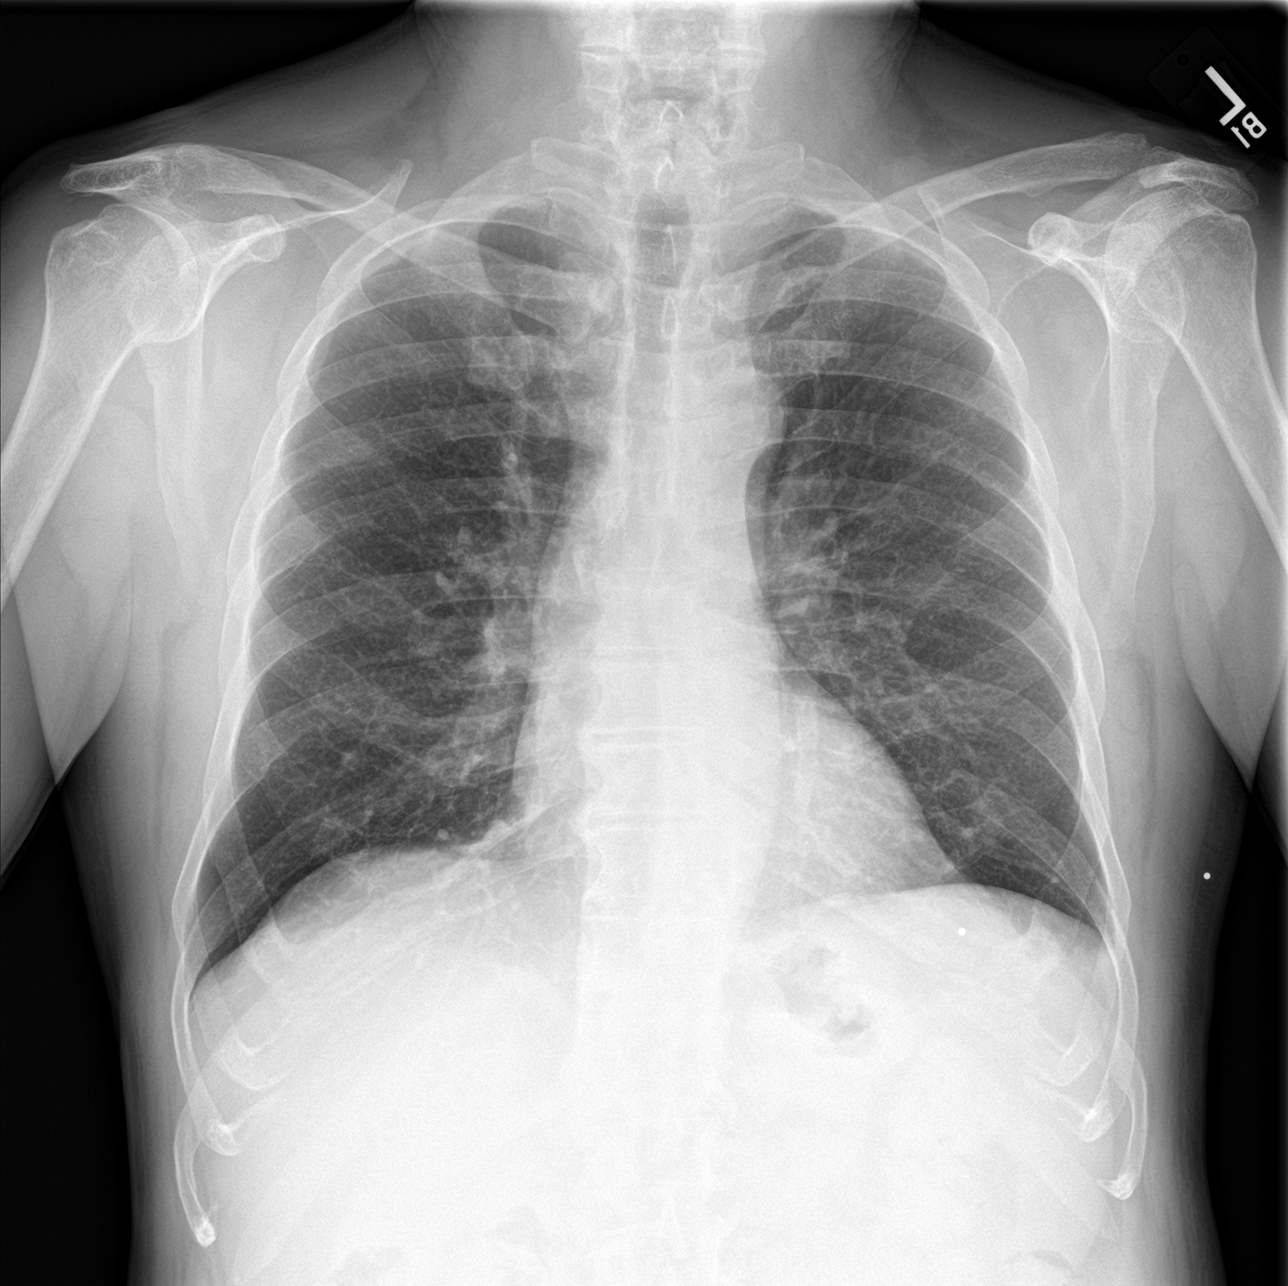

[rib ap]
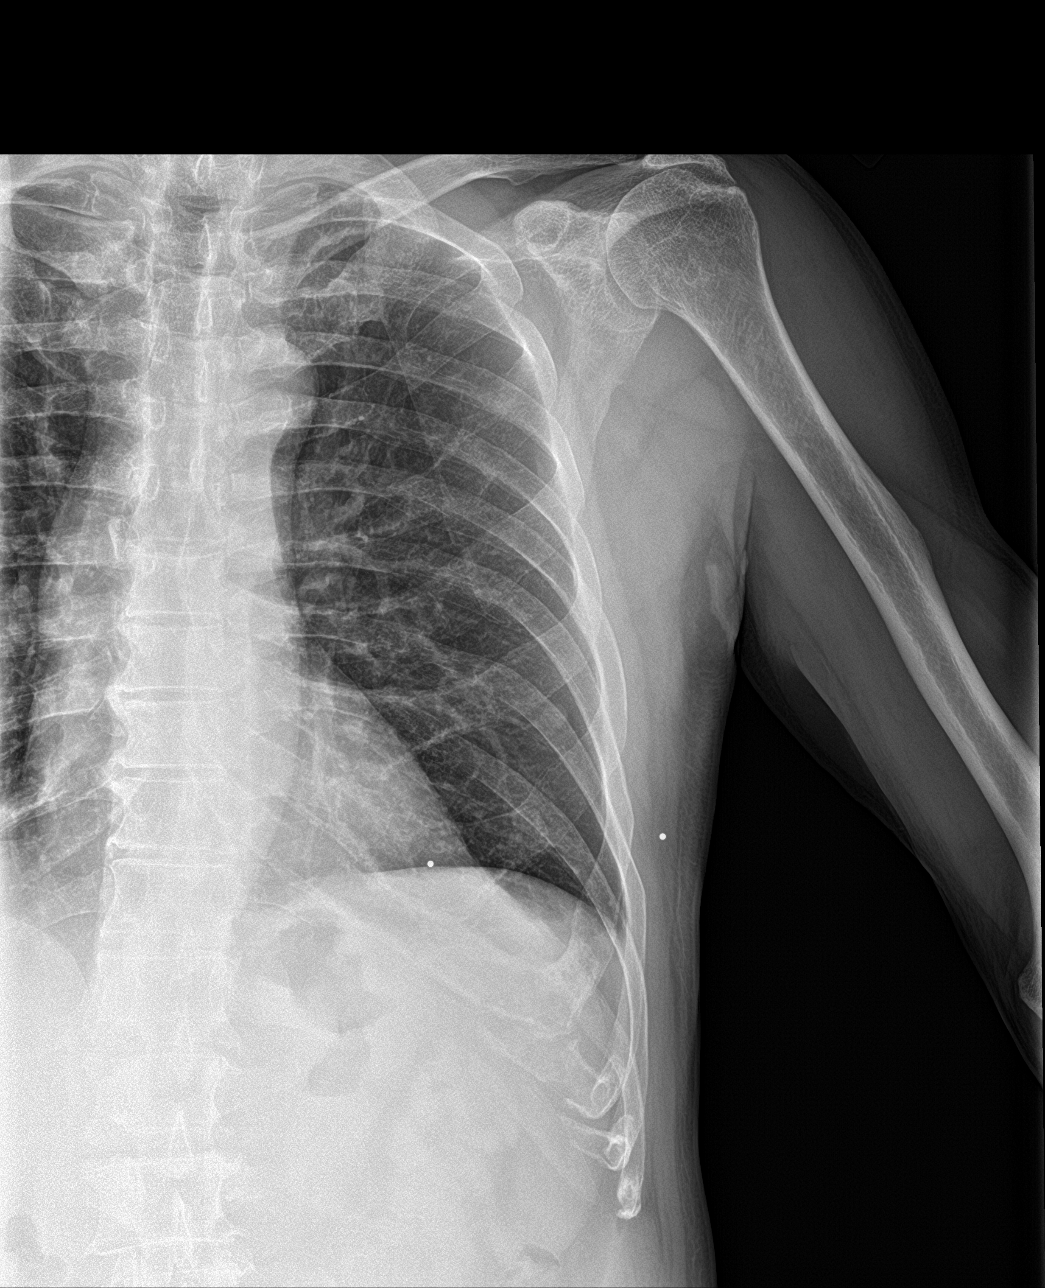

[rib ap obl]
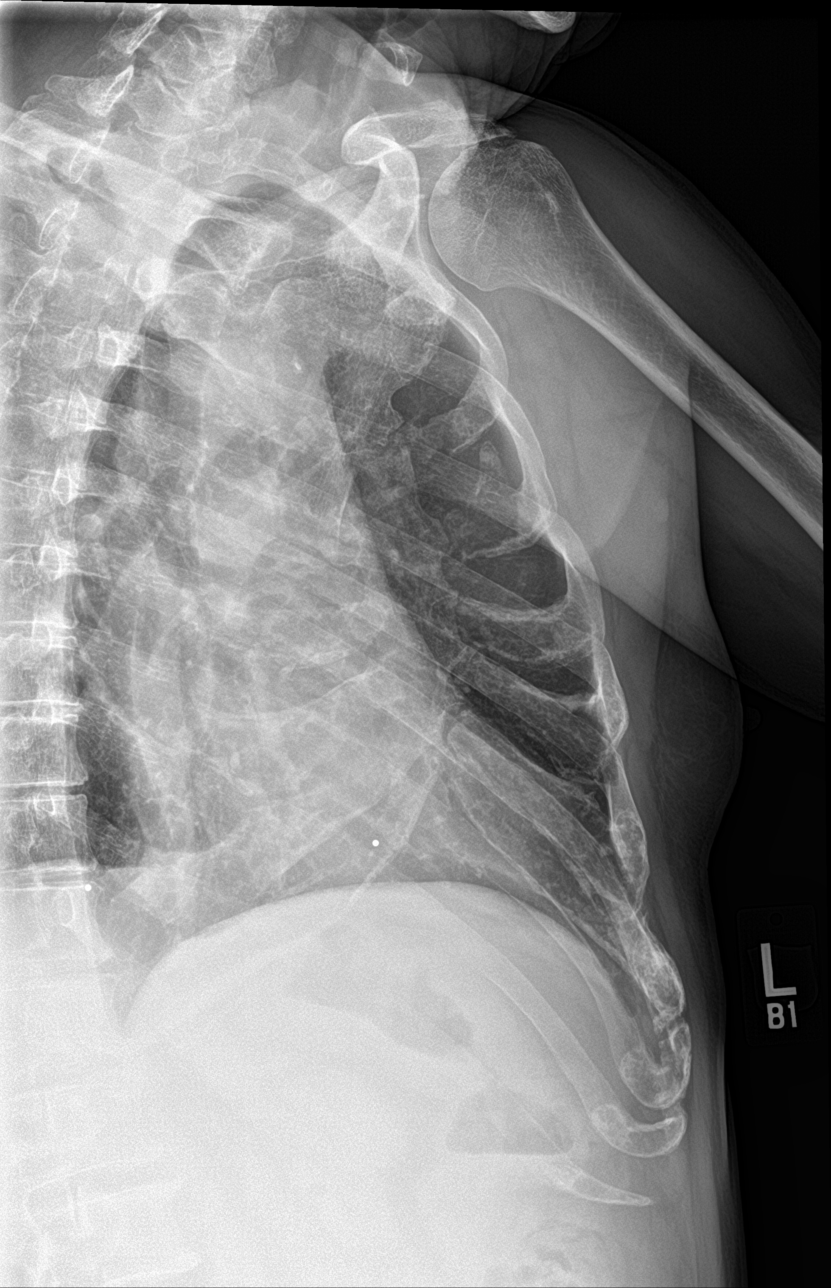

[3 of 3 positions shown; findings below may reference images not displayed]

FINDINGS: Slightly prominent left hilar markings appear chronic. No focal
airspace disease or pulmonary edema. Heart and mediastinum are
within normal limits for size. Trachea is midline. Negative for a
pneumothorax. BB markers were placed along the left lower chest.
There is concern for a displaced fracture involving the medial
posterior aspect of the left eighth rib on the oblique image.
However, this area of concern does not correspond with a BB marker.
No other areas are concerning for rib fracture.
IMPRESSION: 1. Questionable fracture involving the posterior left eighth rib.
This area does not correspond with the area of concern and could be
related to overlying structures. No other areas are concerning for a
left rib fracture.
2. No acute cardiopulmonary disease.

## 2018-04-17 MED ORDER — HYDROCODONE-ACETAMINOPHEN 5-325 MG PO TABS: 1.0000 | ORAL_TABLET | Freq: Four times a day (QID) | ORAL | 0 refills | Status: DC | PRN

## 2018-04-17 NOTE — ED Provider Notes (Signed)
[facility] EMERGENCY DEPARTMENT Provider Note   CSN: [id number] Arrival date & time: 04/17/18  9518     History   Chief Complaint Chief Complaint  Patient presents with  . Fall    HPI QUANAH MAJKA is a 62 y.o. male who presents the emergency department chief complaint of left posterior rib pain.  Patient states that he was putting his boat on the back of his truck 1 week ago when he lost his footing fell backward onto his left posterior ribs.  He states that he heard a pop and had immediate pain but thought he might have some significant bruising.  He states that it has not improved at all.  He tried to work this over the weekend and yesterday and had significant difficulty.  He is tried Motrin and Tylenol without relief of his pain.  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  He denies shortness of breath but does have pain with deep inhalation.  He denies fever, chills, productive cough or hemoptysis.  Is a daily smoker is a history of hypertension, and diabetes.  HPI  Past Medical History:  Diagnosis Date  . Borderline diabetic    diet controlled, pt does not check cbg  . Diabetes mellitus    no medications  . Hernia, inguinal, bilateral    current  . Hypertension   . Pneumonia 10-2011   HOSPITALIZED FOR PNEUMONIA MAY 2013.  PT HAS PULMONARY CLEARANCE FROM DR. WERT FOR HERNIA REPAIR WITH DR. Johney Maine  . Rash  since 7-8-201   red rash around bellybutton and belt line and both feet  - resolved    Patient Active Problem List   Diagnosis Date Noted  . Bronchitis, acute 08/07/2012  . Hypertension 12/16/2011  . Bilateral inguinal hernia (BIH) 11/26/2011  . Umbilical hernia 84/16/6063  . Internal prolapsed hemorrhoids 11/26/2011  . Smoker 10/31/2011    Past Surgical History:  Procedure Laterality Date  . ESOPHAGOGASTRODUODENOSCOPY  03/26/2012   Procedure:  ESOPHAGOGASTRODUODENOSCOPY (EGD);  Surgeon: Beryle Beams, MD;  Location: [facility] ENDOSCOPY;  Service: Endoscopy;  Laterality: N/A;  . HERNIA REPAIR  01/30/2012   umb hernia  . INGUINAL HERNIA REPAIR  01/30/2012   Procedure: LAPAROSCOPIC BILATERAL INGUINAL HERNIA REPAIR;  Surgeon: Adin Hector, MD;  Location: [facility] ORS;  Service: General;  Laterality: Bilateral;  . NO PAST SURGERIES    . UMBILICAL HERNIA REPAIR  01/30/2012   Procedure: HERNIA REPAIR UMBILICAL ADULT;  Surgeon: Adin Hector, MD;  Location: [facility] ORS;  Service: General;  Laterality: N/A;  Primary Umbilical Hernia Repair        Home Medications    Prior to Admission medications   Medication Sig Start Date End Date Taking? Authorizing Provider  hydrochlorothiazide (HYDRODIURIL) 25 MG tablet Take 1 tablet (25 mg total) by mouth daily. Patient not taking: Reported on 06/09/2017 08/31/12   Rama, Venetia Maxon, MD  hydrocortisone 2.5 % cream Apply 1 application topically daily. 04/05/17   [provider]  losartan (COZAAR) 25 MG tablet Take 1 tablet (25 mg total) by mouth daily. Patient not taking: Reported on 06/09/2017 07/29/12   Murlean Iba, MD  naproxen sodium (ALEVE) 220 MG tablet Take 440 mg by mouth daily as needed (Headaches, Bodyaches).    [provider]  predniSONE (DELTASONE) 20 MG tablet Take 2 tablets (40 mg total) by mouth daily with breakfast. For  the next four days 06/09/17   Carmin Muskrat, MD  PRESCRIPTION MEDICATION Apply 1 application topically daily. Topical Cream; Apply to the face daily for Rosacea.    [provider]  ZITHROMAX Z-PAK 250 MG tablet Take 2 tabs today and then 1 tab every day for next 4 days. Patient not taking: Reported on 06/09/2017 08/07/12   Janell Quiet, MD  omeprazole (PRILOSEC) 40 MG capsule Take 1 capsule (40 mg total) by mouth daily. 03/26/12 07/29/12  Carol Ada, MD    Family History History reviewed. No pertinent family history.  Social History Social  History   Tobacco Use  . Smoking status: Current Some Day Smoker    Packs/day: 0.30    Years: 30.00    Pack years: 9.00    Types: Cigarettes  . Smokeless tobacco: Never Used  Substance Use Topics  . Alcohol use: Yes    Alcohol/week: 3.0 standard drinks    Types: 3 Cans of beer per week  . Drug use: No     Allergies   Penicillins and Sulfa antibiotics   Review of Systems Review of Systems Ten systems reviewed and are negative for acute change, except as noted in the HPI.    Physical Exam Updated Vital Signs BP (!) 192/106 (BP Location: Right Arm)   Pulse 94   Temp 97.9 F (36.6 C) (Oral)   Resp 18   Ht 5\' 7"  (1.702 m)   Wt 68 kg   SpO2 97%   BMI 23.49 kg/m   Physical Exam  Constitutional: He appears well-developed and well-nourished. No distress.  HENT:  Head: Normocephalic and atraumatic.  Eyes: Conjunctivae are normal. No scleral icterus.  Neck: Normal range of motion. Neck supple.  Cardiovascular: Normal rate, regular rhythm and normal heart sounds.  Pulmonary/Chest: Effort normal and breath sounds normal. No respiratory distress.      Patient with point tenderness over the left mid lateral posterior rib cage.  No crepitus or step-off  Abdominal: Soft. There is no tenderness.  Musculoskeletal: He exhibits no edema.  Neurological: He is alert.  Skin: Skin is warm and dry. He is not diaphoretic.  Psychiatric: His behavior is normal.  Nursing note and vitals reviewed.    ED Treatments / Results  Labs (all labs ordered are listed, but only abnormal results are displayed) Labs Reviewed - No data to display  EKG None  Radiology Dg Ribs Unilateral W/chest Left  Result Date: 04/17/2018 CLINICAL DATA:  [place] off truck last Friday. Hit ground on left side. Left rib pain. Smoking history. EXAM: LEFT RIBS AND CHEST - 3+ VIEW COMPARISON:  Chest radiograph 06/09/2017 FINDINGS: Slightly prominent left hilar markings appear chronic. No focal airspace disease  or pulmonary edema. Heart and mediastinum are within normal limits for size. Trachea is midline. Negative for a pneumothorax. BB markers were placed along the left lower chest. There is concern for a displaced fracture involving the medial posterior aspect of the left eighth rib on the oblique image. However, this area of concern does not correspond with a BB marker. No other areas are concerning for rib fracture. IMPRESSION: 1. Questionable fracture involving the posterior left eighth rib. This area does not correspond with the area of concern and could be related to overlying structures. No other areas are concerning for a left rib fracture. 2. No acute cardiopulmonary disease. Electronically Signed   By: Markus Daft M.D.   On: 04/17/2018 08:17    Procedures Procedures (including critical care time)  Medications  Ordered in ED Medications - No data to display   Initial Impression / Assessment and Plan / ED Course  I have reviewed the triage vital signs and the nursing notes.  Pertinent labs & imaging results that were available during my care of the patient were reviewed by me and considered in my medical decision making (see chart for details).  Clinical Course as of Apr 17 904  Fri Apr 17, 2018  0626 I have reviewed the patient's PA, lateral chest film and rib view.  The radiologist mentions a questionable eighth rib fracture.  However on review of the plain films I see a very obvious midshaft posterior seventh rib fracture which correlates with point tenderness on examination.   [AH]  0902 Patient reviewed in the [facility] PMP aware system.  He has no controlled substances on file for the last year.   [AH]    Clinical Course User Index [AH] Margarita Mail, PA-C    This is a 62 year old male with a an obvious posterior rib fracture.  He was given definitive fracture care including's incentive spirometer and pain control.  The patient is not hypoxic here in the emergency department.   He is notably hypertensive however he did not take his regular antihypertensive medications today.  Patient given work restrictions.  He appears appropriate for discharge at this time.  I have discussed return precautions The patient was counseled on the dangers of tobacco use, and was advised to quit.  Reviewed strategies to maximize success, including removing cigarettes and smoking materials from environment, stress management, substitution of other forms of reinforcement, support of family/friends and written materials. .   Final Clinical Impressions(s) / ED Diagnoses   Final diagnoses:  Closed fracture of one rib of left side, initial encounter  Tobacco abuse    ED Discharge Orders    None       Margarita Mail, PA-C 04/17/18 1653    Noemi Chapel, MD 04/18/18 [phone]

## 2018-04-17 NOTE — Discharge Instructions (Signed)
Use lidocaine 4% patches available OTC  Follow these instructions at home: Avoid strenuous activity and any activities or movements that cause pain. Be careful during activities and avoid bumping the injured rib. Gradually increase activity as directed by your caregiver. Only take over-the-counter or prescription medications as directed by your caregiver. Do not take other medications without asking your caregiver first. Apply ice to the injured area for the first 1-2 days after you have been treated or as directed by your caregiver. Applying ice helps to reduce inflammation and pain. Put ice in a plastic bag. Place a towel between your skin and the bag. Leave the ice on for 15-20 minutes at a time, every 2 hours while you are awake. Perform deep breathing as directed by your caregiver. This will help prevent pneumonia, which is a common complication of a broken rib. Your caregiver may instruct you to: Take deep breaths several times a day. Try to cough several times a day, holding a pillow against the injured area. Use a device called an incentive spirometer to practice deep breathing several times a day. Drink enough fluids to keep your urine clear or pale yellow. This will help you avoid constipation. Do not wear a rib belt or binder. These restrict breathing, which can lead to pneumonia. Get help right away if: You have a fever. You have difficulty breathing or shortness of breath. You develop a continual cough, or you cough up thick or bloody sputum. You feel sick to your stomach (nausea), throw up (vomit), or have abdominal pain. You have worsening pain not controlled with medications

## 2018-04-17 NOTE — ED Triage Notes (Signed)
Pt in stating he fell last week and landed on his back on the left side, he thought he just bruised some ribs but pain has continued, worse with inspiration, no distress noted

## 2019-06-29 DIAGNOSIS — R739 Hyperglycemia, unspecified: Secondary | ICD-10-CM | POA: Diagnosis not present

## 2019-06-29 DIAGNOSIS — E785 Hyperlipidemia, unspecified: Secondary | ICD-10-CM | POA: Diagnosis not present

## 2019-06-29 DIAGNOSIS — I10 Essential (primary) hypertension: Secondary | ICD-10-CM | POA: Diagnosis not present

## 2019-06-29 DIAGNOSIS — Z1331 Encounter for screening for depression: Secondary | ICD-10-CM | POA: Diagnosis not present

## 2019-06-29 DIAGNOSIS — Z87891 Personal history of nicotine dependence: Secondary | ICD-10-CM | POA: Diagnosis not present

## 2019-06-29 DIAGNOSIS — Z125 Encounter for screening for malignant neoplasm of prostate: Secondary | ICD-10-CM | POA: Diagnosis not present

## 2019-06-29 DIAGNOSIS — R042 Hemoptysis: Secondary | ICD-10-CM | POA: Diagnosis not present

## 2019-07-02 DIAGNOSIS — R042 Hemoptysis: Secondary | ICD-10-CM | POA: Diagnosis not present

## 2019-07-02 DIAGNOSIS — R05 Cough: Secondary | ICD-10-CM | POA: Diagnosis not present

## 2019-08-25 DIAGNOSIS — T2220XA Burn of second degree of shoulder and upper limb, except wrist and hand, unspecified site, initial encounter: Secondary | ICD-10-CM | POA: Diagnosis not present

## 2019-08-25 DIAGNOSIS — T24202A Burn of second degree of unspecified site of left lower limb, except ankle and foot, initial encounter: Secondary | ICD-10-CM | POA: Diagnosis not present

## 2019-08-25 DIAGNOSIS — Z6823 Body mass index (BMI) 23.0-23.9, adult: Secondary | ICD-10-CM | POA: Diagnosis not present

## 2019-09-06 DIAGNOSIS — Z23 Encounter for immunization: Secondary | ICD-10-CM | POA: Diagnosis not present

## 2019-10-04 DIAGNOSIS — Z23 Encounter for immunization: Secondary | ICD-10-CM | POA: Diagnosis not present

## 2020-02-29 DIAGNOSIS — R040 Epistaxis: Secondary | ICD-10-CM | POA: Diagnosis not present

## 2020-02-29 DIAGNOSIS — Z6821 Body mass index (BMI) 21.0-21.9, adult: Secondary | ICD-10-CM | POA: Diagnosis not present

## 2020-03-31 DIAGNOSIS — R0981 Nasal congestion: Secondary | ICD-10-CM | POA: Diagnosis not present

## 2020-03-31 DIAGNOSIS — R040 Epistaxis: Secondary | ICD-10-CM | POA: Diagnosis not present

## 2020-03-31 DIAGNOSIS — I10 Essential (primary) hypertension: Secondary | ICD-10-CM | POA: Diagnosis not present

## 2020-03-31 DIAGNOSIS — J342 Deviated nasal septum: Secondary | ICD-10-CM | POA: Diagnosis not present

## 2020-05-03 DIAGNOSIS — J329 Chronic sinusitis, unspecified: Secondary | ICD-10-CM | POA: Diagnosis not present

## 2020-05-03 DIAGNOSIS — M899 Disorder of bone, unspecified: Secondary | ICD-10-CM | POA: Diagnosis not present

## 2020-05-03 DIAGNOSIS — K011 Impacted teeth: Secondary | ICD-10-CM | POA: Diagnosis not present

## 2020-05-03 DIAGNOSIS — J321 Chronic frontal sinusitis: Secondary | ICD-10-CM | POA: Diagnosis not present

## 2020-05-03 DIAGNOSIS — J320 Chronic maxillary sinusitis: Secondary | ICD-10-CM | POA: Diagnosis not present

## 2020-05-08 DIAGNOSIS — J343 Hypertrophy of nasal turbinates: Secondary | ICD-10-CM | POA: Diagnosis not present

## 2020-05-08 DIAGNOSIS — J320 Chronic maxillary sinusitis: Secondary | ICD-10-CM | POA: Diagnosis not present

## 2020-05-08 DIAGNOSIS — R0981 Nasal congestion: Secondary | ICD-10-CM | POA: Diagnosis not present

## 2020-05-08 DIAGNOSIS — J342 Deviated nasal septum: Secondary | ICD-10-CM | POA: Diagnosis not present

## 2020-06-05 DIAGNOSIS — L719 Rosacea, unspecified: Secondary | ICD-10-CM | POA: Diagnosis not present

## 2020-06-05 DIAGNOSIS — I10 Essential (primary) hypertension: Secondary | ICD-10-CM | POA: Diagnosis not present

## 2020-06-05 DIAGNOSIS — R7401 Elevation of levels of liver transaminase levels: Secondary | ICD-10-CM | POA: Diagnosis not present

## 2020-06-05 DIAGNOSIS — Z6822 Body mass index (BMI) 22.0-22.9, adult: Secondary | ICD-10-CM | POA: Diagnosis not present

## 2020-06-12 DIAGNOSIS — R768 Other specified abnormal immunological findings in serum: Secondary | ICD-10-CM | POA: Diagnosis not present

## 2020-07-17 DIAGNOSIS — R7401 Elevation of levels of liver transaminase levels: Secondary | ICD-10-CM | POA: Diagnosis not present

## 2020-07-17 DIAGNOSIS — I10 Essential (primary) hypertension: Secondary | ICD-10-CM | POA: Diagnosis not present

## 2020-07-17 DIAGNOSIS — E119 Type 2 diabetes mellitus without complications: Secondary | ICD-10-CM | POA: Diagnosis not present

## 2020-07-17 DIAGNOSIS — R768 Other specified abnormal immunological findings in serum: Secondary | ICD-10-CM | POA: Diagnosis not present

## 2020-08-16 DIAGNOSIS — B182 Chronic viral hepatitis C: Secondary | ICD-10-CM | POA: Diagnosis not present

## 2020-08-16 DIAGNOSIS — R945 Abnormal results of liver function studies: Secondary | ICD-10-CM | POA: Diagnosis not present

## 2020-08-16 DIAGNOSIS — Z1389 Encounter for screening for other disorder: Secondary | ICD-10-CM | POA: Diagnosis not present

## 2020-08-16 DIAGNOSIS — R7401 Elevation of levels of liver transaminase levels: Secondary | ICD-10-CM | POA: Diagnosis not present

## 2020-08-16 DIAGNOSIS — R748 Abnormal levels of other serum enzymes: Secondary | ICD-10-CM | POA: Diagnosis not present

## 2020-08-22 ENCOUNTER — Emergency Department (HOSPITAL_COMMUNITY): Payer: BC Managed Care – PPO

## 2020-08-22 ENCOUNTER — Other Ambulatory Visit: Payer: Self-pay

## 2020-08-22 ENCOUNTER — Emergency Department (HOSPITAL_COMMUNITY)
Admission: EM | Admit: 2020-08-22 | Discharge: 2020-08-22 | Disposition: A | Discharge status: Home / Self Care | Payer: BC Managed Care – PPO | Attending: Emergency Medicine | Admitting: Emergency Medicine

## 2020-08-22 DIAGNOSIS — F17210 Nicotine dependence, cigarettes, uncomplicated: Secondary | ICD-10-CM | POA: Diagnosis not present

## 2020-08-22 DIAGNOSIS — E119 Type 2 diabetes mellitus without complications: Secondary | ICD-10-CM | POA: Insufficient documentation

## 2020-08-22 DIAGNOSIS — Z789 Other specified health status: Secondary | ICD-10-CM

## 2020-08-22 DIAGNOSIS — G5632 Lesion of radial nerve, left upper limb: Secondary | ICD-10-CM

## 2020-08-22 DIAGNOSIS — M6281 Muscle weakness (generalized): Secondary | ICD-10-CM | POA: Diagnosis not present

## 2020-08-22 DIAGNOSIS — R531 Weakness: Secondary | ICD-10-CM | POA: Diagnosis not present

## 2020-08-22 DIAGNOSIS — I10 Essential (primary) hypertension: Secondary | ICD-10-CM | POA: Diagnosis not present

## 2020-08-22 DIAGNOSIS — Z79899 Other long term (current) drug therapy: Secondary | ICD-10-CM | POA: Diagnosis not present

## 2020-08-22 DIAGNOSIS — F1099 Alcohol use, unspecified with unspecified alcohol-induced disorder: Secondary | ICD-10-CM | POA: Insufficient documentation

## 2020-08-22 DIAGNOSIS — R29810 Facial weakness: Secondary | ICD-10-CM | POA: Diagnosis not present

## 2020-08-22 DIAGNOSIS — I6782 Cerebral ischemia: Secondary | ICD-10-CM | POA: Diagnosis not present

## 2020-08-22 LAB — COMPREHENSIVE METABOLIC PANEL
ALT: 101 U/L — ABNORMAL HIGH (ref 0–44)
AST: 116 U/L — ABNORMAL HIGH (ref 15–41)
Albumin: 3.5 g/dL (ref 3.5–5.0)
Alkaline Phosphatase: 95 U/L (ref 38–126)
Anion gap: 12 (ref 5–15)
BUN: 18 mg/dL (ref 8–23)
CO2: 20 mmol/L — ABNORMAL LOW (ref 22–32)
Calcium: 9.8 mg/dL (ref 8.9–10.3)
Chloride: 100 mmol/L (ref 98–111)
Creatinine, Ser: 1.3 mg/dL — ABNORMAL HIGH (ref 0.61–1.24)
GFR, Estimated: >60 mL/min (ref >60)
Glucose, Bld: 138 mg/dL — ABNORMAL HIGH (ref 70–99)
Potassium: 3.9 mmol/L (ref 3.5–5.1)
Sodium: 132 mmol/L — ABNORMAL LOW (ref 135–145)
Total Bilirubin: 1.2 mg/dL (ref 0.3–1.2)
Total Protein: 7.5 g/dL (ref 6.5–8.1)

## 2020-08-22 LAB — I-STAT CHEM 8, ED
BUN: 21 mg/dL (ref 8–23)
Calcium, Ion: 0.97 mmol/L — ABNORMAL LOW (ref 1.15–1.40)
Chloride: 102 mmol/L (ref 98–111)
Creatinine, Ser: 1.4 mg/dL — ABNORMAL HIGH (ref 0.61–1.24)
Glucose, Bld: 135 mg/dL — ABNORMAL HIGH (ref 70–99)
HCT: 48 % (ref 39.0–52.0)
Hemoglobin: 16.3 g/dL (ref 13.0–17.0)
Potassium: 3.9 mmol/L (ref 3.5–5.1)
Sodium: 132 mmol/L — ABNORMAL LOW (ref 135–145)
TCO2: 20 mmol/L — ABNORMAL LOW (ref 22–32)

## 2020-08-22 LAB — CBG MONITORING, ED: Glucose-Capillary: 139 mg/dL — ABNORMAL HIGH (ref 70–99)

## 2020-08-22 LAB — PROTIME-INR
INR: 1.1 (ref 0.8–1.2)
Prothrombin Time: 13.8 seconds (ref 11.4–15.2)

## 2020-08-22 LAB — DIFFERENTIAL
Abs Immature Granulocytes: 0 10*3/uL (ref 0.00–0.07)
Basophils Absolute: 0.1 10*3/uL (ref 0.0–0.1)
Basophils Relative: 2 %
Eosinophils Absolute: 0.3 10*3/uL (ref 0.0–0.5)
Eosinophils Relative: 5 %
Immature Granulocytes: 0 %
Lymphocytes Relative: 46 %
Lymphs Abs: 2.7 10*3/uL (ref 0.7–4.0)
Monocytes Absolute: 0.6 10*3/uL (ref 0.1–1.0)
Monocytes Relative: 10 %
Neutro Abs: 2.1 10*3/uL (ref 1.7–7.7)
Neutrophils Relative %: 37 %

## 2020-08-22 LAB — CBC
HCT: 45.2 % (ref 39.0–52.0)
Hemoglobin: 16.4 g/dL (ref 13.0–17.0)
MCH: 35.8 pg — ABNORMAL HIGH (ref 26.0–34.0)
MCHC: 36.3 g/dL — ABNORMAL HIGH (ref 30.0–36.0)
MCV: 98.7 fL (ref 80.0–100.0)
Platelets: 117 10*3/uL — ABNORMAL LOW (ref 150–400)
RBC: 4.58 MIL/uL (ref 4.22–5.81)
RDW: 12.3 % (ref 11.5–15.5)
WBC: 5.8 10*3/uL (ref 4.0–10.5)
nRBC: 0 % (ref 0.0–0.2)

## 2020-08-22 LAB — APTT: aPTT: 31 seconds (ref 24–36)

## 2020-08-22 LAB — ETHANOL: Alcohol, Ethyl (B): 160 mg/dL — ABNORMAL HIGH (ref <10)

## 2020-08-22 IMAGING — CT CT HEAD CODE STROKE
3 of 4 series · 15 of 47 positions shown, 18 images · non-contrast
Comparison: None available.

CLINICAL DATA: Code stroke. Initial evaluation for acute left arm
numbness, weakness.

EXAM:
CT HEAD WITHOUT CONTRAST
TECHNIQUE: Contiguous axial images were obtained from the base of the skull
through the vertex without intravenous contrast.

[Series 4: head 2.0 h70h · axial · 0.44mm/px · z∈[-75,+59]mm · 9 of 85 slices shown, 12 images]
[im 9/85  brain]
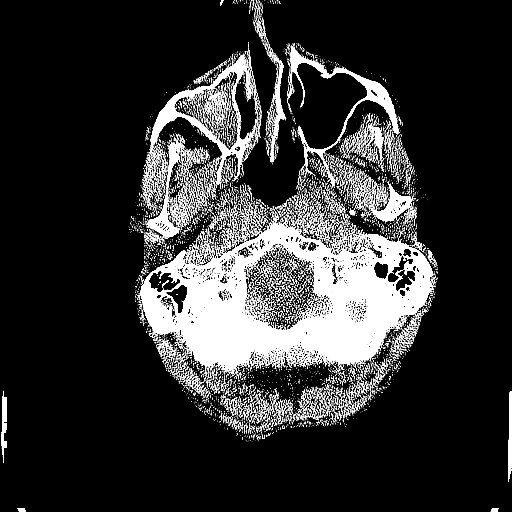
[im 9/85  bone]
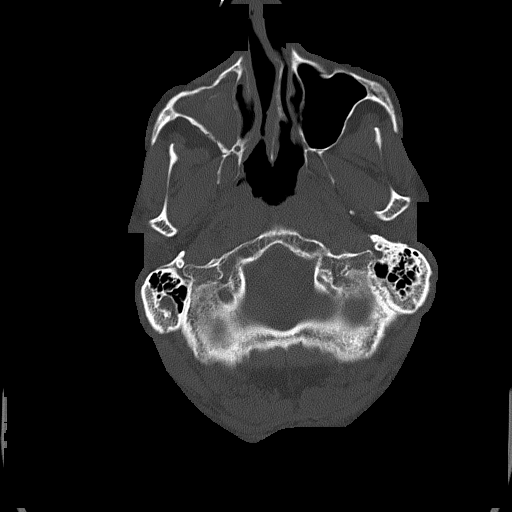
[im 17/85  brain]
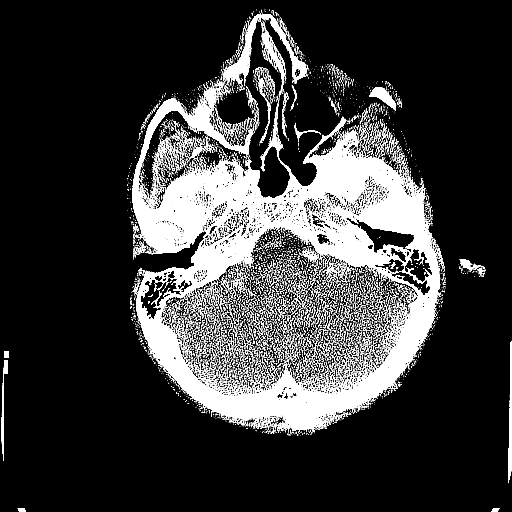
[im 26/85  brain]
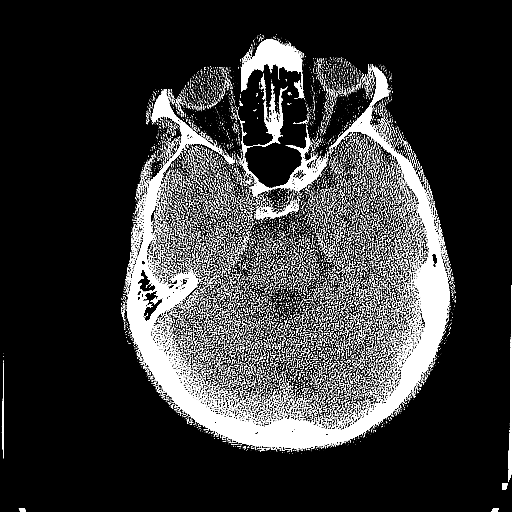
[im 34/85  brain]
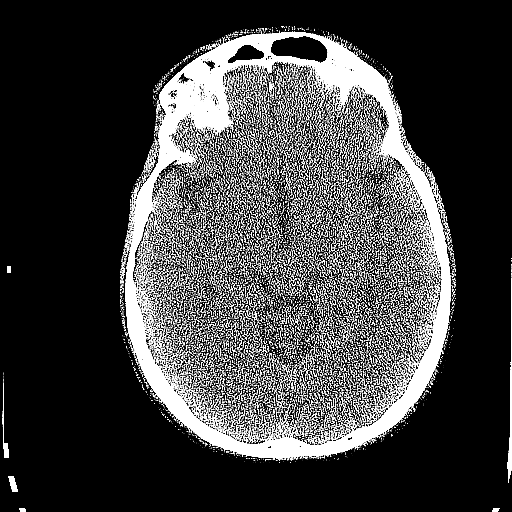
[im 43/85  brain]
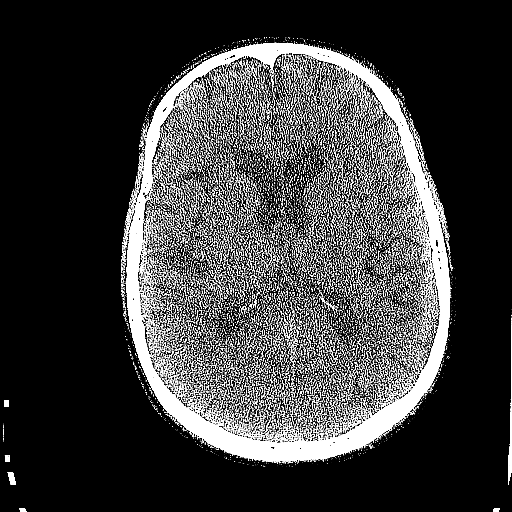
[im 43/85  bone]
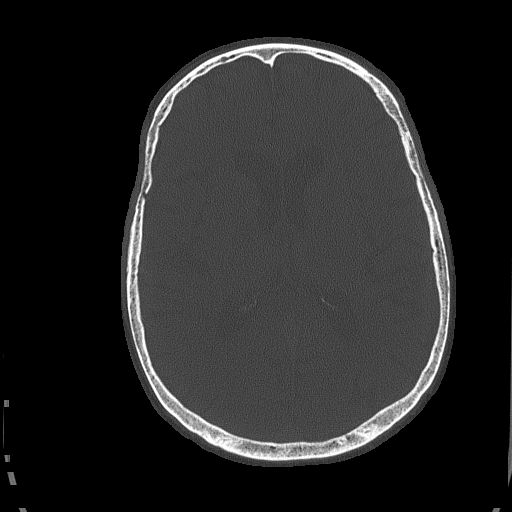
[im 51/85  brain]
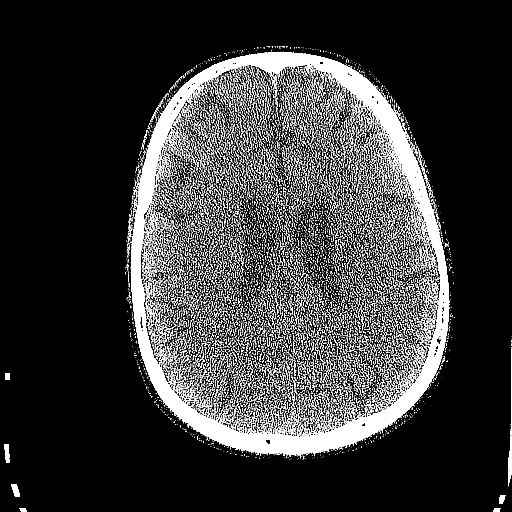
[im 59/85  brain]
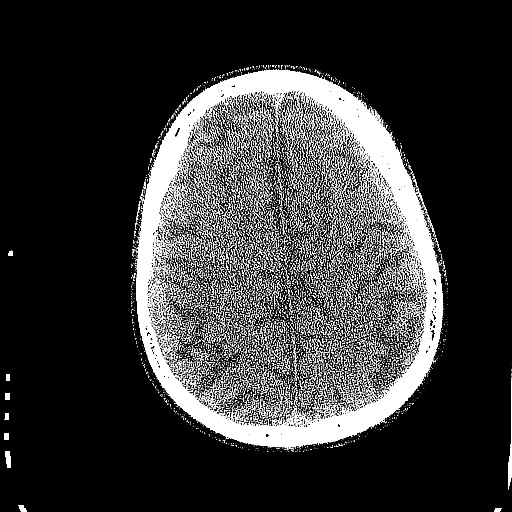
[im 68/85  brain]
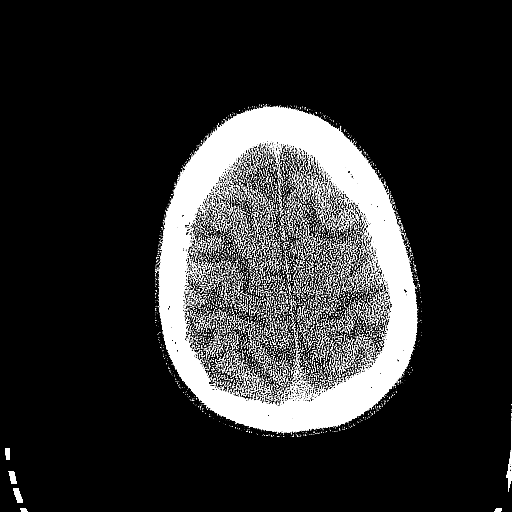
[im 76/85  brain]
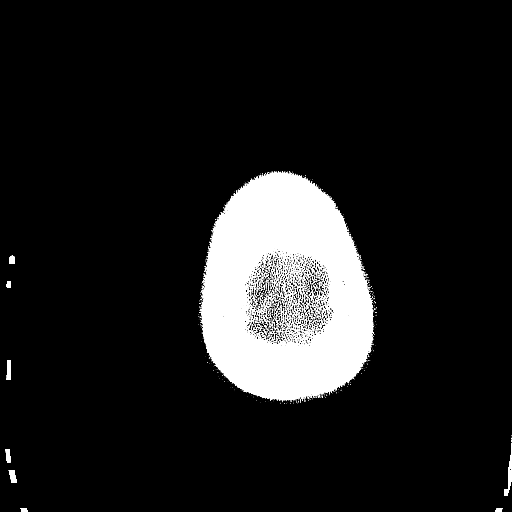
[im 76/85  bone]
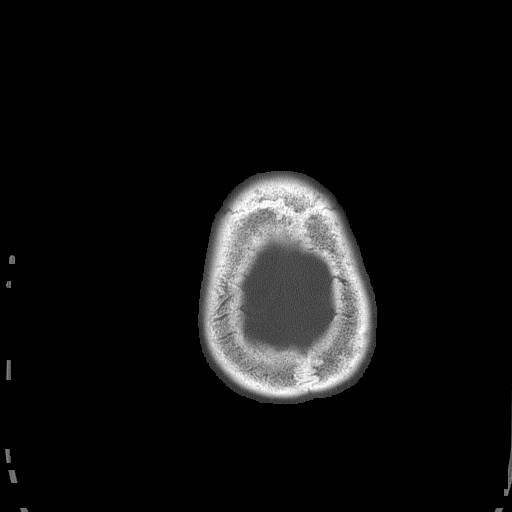

[Series 5: head 3.0 mpr cor · coronal · 0.35mm/px · 3 of 75 slices shown]
[im 25/75  brain]
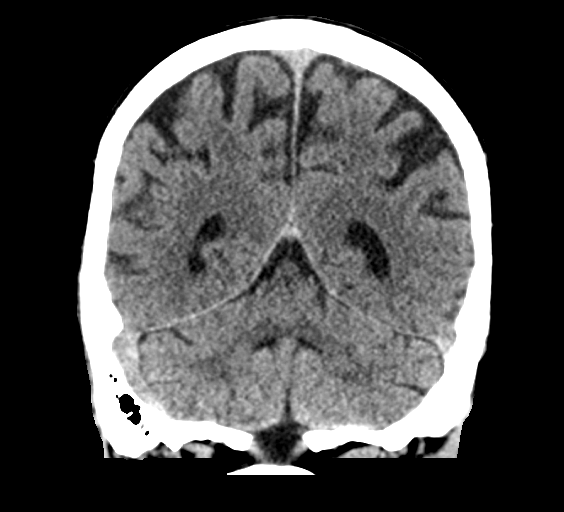
[im 33/75  brain]
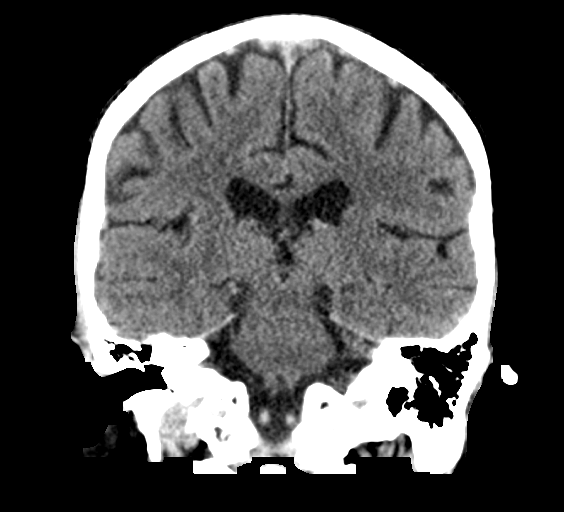
[im 42/75  brain]
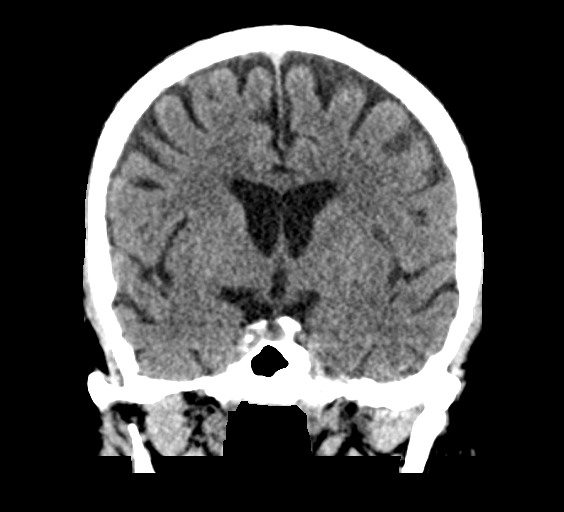

[Series 6: head 3.0 mpr sag · sagittal · 0.33mm/px · 3 of 67 slices shown]
[im 23/67  brain]
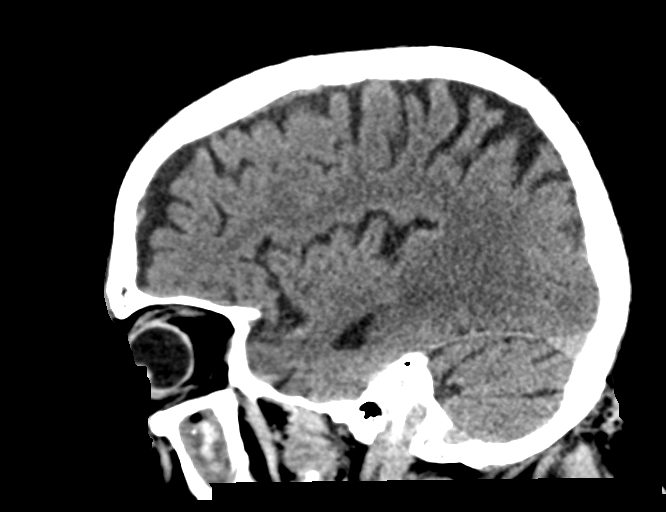
[im 34/67  brain]
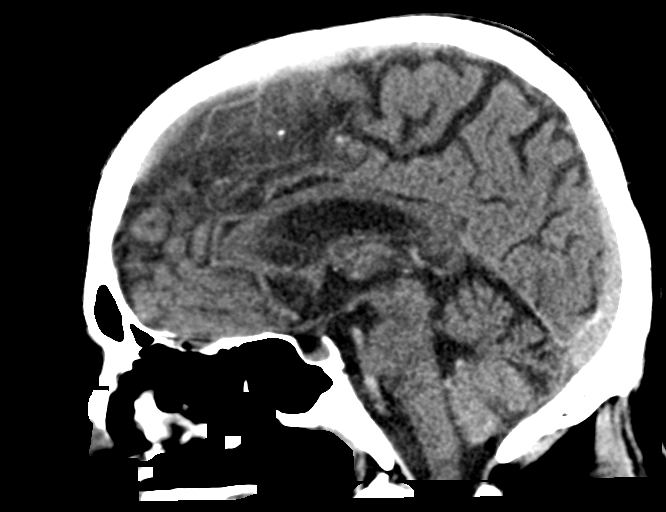
[im 45/67  brain]
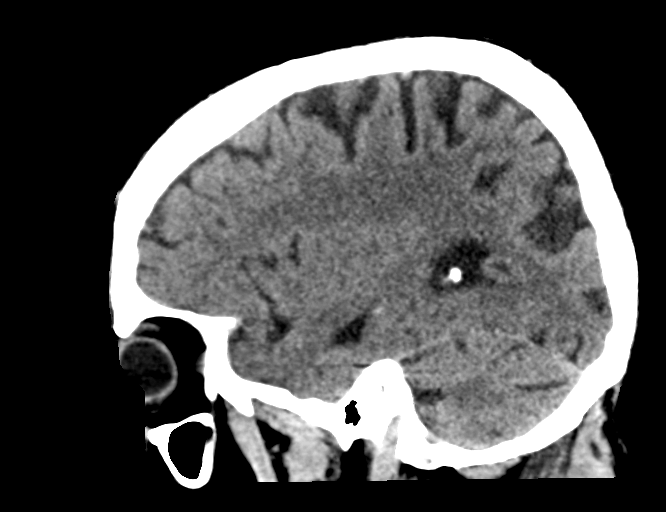

[15 of 47 positions shown; findings below may reference images not displayed]

FINDINGS: Brain: Age-appropriate cerebral atrophy with mild chronic small
vessel ischemic disease. No acute intracranial hemorrhage. No acute
large vessel territory infarct. No mass lesion, midline shift or
mass effect. No hydrocephalus or extra-axial fluid collection.

Vascular: No hyperdense vessel.

Skull: Scalp soft tissues and calvarium within normal limits.

Sinuses/Orbits: Globes and orbital soft tissues demonstrate no acute
finding. Chronic right maxillary sinusitis. Superimposed
hyperdensity could reflect desiccated secretions and/or superimposed
fungal elements. Paranasal sinuses are otherwise largely clear. No
mastoid effusion.

Other: None.

ASPECTS (Alberta Stroke Program Early CT Score)

- Ganglionic level infarction (caudate, lentiform nuclei, internal
capsule, insula, M1-M3 cortex): 7

- Supraganglionic infarction (M4-M6 cortex): 3

Total score (0-10 with 10 being normal): 10
IMPRESSION: 1. No acute intracranial infarct or other abnormality.
2. ASPECTS is 10.
3. Chronic right maxillary sinusitis.

These results were communicated to Dr. FILANDER at [DATE] pmon
[DATE]by text page via the [employer] messaging system.

## 2020-08-22 MED ORDER — SODIUM CHLORIDE 0.9% FLUSH: 3.0000 mL | Freq: Once | INTRAVENOUS | Status: DC

## 2020-08-22 NOTE — Consult Note (Signed)
Neurology Consult H&P  Levi Conner MR# [id number] 08/22/2020  CC: left wrist drop  History is obtained from: Patient and EMS  HPI: Levi Conner is a 65 y.o. male right handed PMHx as reviewed below with left wrist drop. After work, the patient had a few beers and a few shots of hard liquor and fell asleep in a recliner ~1800. When he tried to open a jar with his left hand and could not. He denies pain or tingling.  No weakness of the left leg or proximal left arm.  He denies vision changes, slurred speech, headache, chest pain, shortness of breath, cough, nausea, vomiting, abd pain.     PMHx HTN and DM2.    He reports a family history of DM, HTN, MI, CVAs.   LKW: 1088 tpa given: No mild IR Thrombectomy No,  Modified Rankin Scale: 0-Completely asymptomatic and back to baseline post- stroke NIHSS: 0  ROS: A complete ROS was performed and is negative except as noted in the HPI.   Past Medical History:  Diagnosis Date  . Borderline diabetic    diet controlled, pt does not check cbg  . Diabetes mellitus    no medications  . Hernia, inguinal, bilateral    current  . Hypertension   . Pneumonia 10-2011   HOSPITALIZED FOR PNEUMONIA MAY 2013.  PT HAS PULMONARY CLEARANCE FROM DR. WERT FOR HERNIA REPAIR WITH DR. Johney Maine  . Rash  since 7-8-201   red rash around bellybutton and belt line and both feet  - resolved     No family history on file.  Social History:  reports that he has been smoking cigarettes. He has a 9.00 pack-year smoking history. He has never used smokeless tobacco. He reports current alcohol use of about 3.0 standard drinks of alcohol per week. He reports that he does not use drugs.   Prior to Admission medications   Medication Sig Start Date End Date Taking? Authorizing Provider  hydrochlorothiazide (HYDRODIURIL) 25 MG tablet Take 1 tablet (25 mg total) by mouth daily. Patient not taking: Reported on 06/09/2017 08/31/12   Rama, Venetia Maxon, MD   HYDROcodone-acetaminophen (NORCO) 5-325 MG tablet Take 1-2 tablets by mouth every 6 (six) hours as needed for moderate pain. 04/17/18   Margarita Mail, PA-C  hydrocortisone 2.5 % cream Apply 1 application topically daily. 04/05/17   [provider]  losartan (COZAAR) 25 MG tablet Take 1 tablet (25 mg total) by mouth daily. Patient not taking: Reported on 06/09/2017 07/29/12   Murlean Iba, MD  naproxen sodium (ALEVE) 220 MG tablet Take 440 mg by mouth daily as needed (Headaches, Bodyaches).    [provider]  predniSONE (DELTASONE) 20 MG tablet Take 2 tablets (40 mg total) by mouth daily with breakfast. For the next four days 06/09/17   Carmin Muskrat, MD  PRESCRIPTION MEDICATION Apply 1 application topically daily. Topical Cream; Apply to the face daily for Rosacea.    [provider]  ZITHROMAX Z-PAK 250 MG tablet Take 2 tabs today and then 1 tab every day for next 4 days. Patient not taking: Reported on 06/09/2017 08/07/12   Janell Quiet, MD    Exam: Current vital signs: BP (!) 158/98 (BP Location: Right Arm)   Pulse 79   Temp 97.6 F (36.4 C) (Temporal)   Ht 5\' 7"  (1.702 m)   Wt 67 kg   SpO2 94%   BMI 23.13 kg/m   Physical Exam  Constitutional: Appears well-developed and well-nourished.  Psych: Affect appropriate to situation Eyes: No scleral injection HENT: No OP obstruction. Head: Normocephalic.  Cardiovascular: Normal rate and regular rhythm.  Respiratory: Effort normal, symmetric excursions bilaterally, no audible wheezing. GI: Soft.  No distension. There is no tenderness.  Skin: WDI  Neuro: Mental Status: Patient is awake, alert, oriented to person, place, month, year, and situation. Patient is able to give a clear and coherent history. Speech fluent, intact comprehension and repetition. No signs of aphasia or neglect. Visual Fields are full. Pupils are equal, round, and reactive to light. EOMI without ptosis or diploplia.  Facial  sensation is symmetric to temperature Facial movement is symmetric.  Hearing is intact to voice. Uvula midline and palate elevates symmetrically. Shoulder shrug is symmetric. Tongue is midline without atrophy or fasciculations.  Tone is normal. Bulk is normal. Strength 5/5 except for profound weakness in left forearm extensor muscle weakness (extensors of wrist, extensors of fingers and thumb). Sensation decreased cold and sharp in the posterior forearm, lateral aspect of the dorsum of the hand and the dorsal surface of the lateral three and a half digits. Deep Tendon Reflexes: 2+ and symmetric in the biceps and patellae. Toes are downgoing bilaterally. FNF and HKS are intact bilaterally. Gait - Deferred  I have reviewed labs in epic and the pertinent results are:   Ref. Range 08/22/2020 20:23  Sodium Latest Ref Range: 135 - 145 mmol/L 132 (L)  Potassium Latest Ref Range: 3.5 - 5.1 mmol/L 3.9  Chloride Latest Ref Range: 98 - 111 mmol/L 100  CO2 Latest Ref Range: 22 - 32 mmol/L 20 (L)  Glucose Latest Ref Range: 70 - 99 mg/dL 138 (H)  BUN Latest Ref Range: 8 - 23 mg/dL 18  Creatinine Latest Ref Range: 0.61 - 1.24 mg/dL 1.30 (H)    I have reviewed the images obtained: NCT head showed no acute intracranial infarct or other abnormality, ASPECTS 10.  Assessment: Levi Conner is a 65 y.o. male right handed PMHx DM, HTN and significant daily alcohol use with left forearm extensor muscle weakness (extensors of wrist, extensors of fingers and thumb) and decreased sensation the distal radial nerve distribution likely secondary to left radial nerve compression neuropathy.   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 Counseled patient on alcohol use. Neurology will be available for further questions.  Electronically signed by:  Lynnae Sandhoff, MD Page: [phone] 08/22/2020, 8:53 PM

## 2020-08-22 NOTE — ED Notes (Signed)
Pt brother Levi Conner called for an update please call [phone]

## 2020-08-22 NOTE — Progress Notes (Signed)
Orthopedic Tech Progress Note Patient Details:  RITHIK ODEA [birth year] [id number]  Ortho Devices Type of Ortho Device: Velcro wrist splint Ortho Device/Splint Location: Left Upper Extremity Ortho Device/Splint Interventions: Ordered,Application,Adjustment   Post Interventions Patient Tolerated: Well Instructions Provided: Adjustment of device,Care of device,Poper ambulation with device   Tammy Sours 08/22/2020, 9:50 PM

## 2020-08-22 NOTE — ED Provider Notes (Signed)
[place] EMERGENCY DEPARTMENT Provider Note   CSN: [id number] Arrival date & time: 08/22/20  2018     History Chief Complaint  Patient presents with  . Code Stroke    Levi Conner is a 65 y.o. male presenting for evaluation of L arm weakness.   Pt states he fell asleep in a recliner after drinking some beer around 6PM. When he woke up, he was able to open a jar with his left hand. He denies pain or tingling.  No weakness of the left leg or left upper arm.  He denies vision changes, slurred speech, headache, chest pain, shortness of breath, cough, nausea, vomiting, abd pain.  He reports history of high blood pressure and diabetes. He reports a family history of DM, HTN, MI, CVAs  HPI     Past Medical History:  Diagnosis Date  . Borderline diabetic    diet controlled, pt does not check cbg  . Diabetes mellitus    no medications  . Hernia, inguinal, bilateral    current  . Hypertension   . Pneumonia 10-2011   HOSPITALIZED FOR PNEUMONIA MAY 2013.  PT HAS PULMONARY CLEARANCE FROM DR. WERT FOR HERNIA REPAIR WITH DR. Johney Maine  . Rash  since 7-8-201   red rash around bellybutton and belt line and both feet  - resolved    Patient Active Problem List   Diagnosis Date Noted  . Bronchitis, acute 08/07/2012  . Hypertension 12/16/2011  . Bilateral inguinal hernia (BIH) 11/26/2011  . Umbilical hernia 41/93/7902  . Internal prolapsed hemorrhoids 11/26/2011  . Smoker 10/31/2011    Past Surgical History:  Procedure Laterality Date  . ESOPHAGOGASTRODUODENOSCOPY  03/26/2012   Procedure: ESOPHAGOGASTRODUODENOSCOPY (EGD);  Surgeon: Beryle Beams, MD;  Location: [facility] ENDOSCOPY;  Service: Endoscopy;  Laterality: N/A;  . HERNIA REPAIR  01/30/2012   umb hernia  . INGUINAL HERNIA REPAIR  01/30/2012   Procedure: LAPAROSCOPIC BILATERAL INGUINAL HERNIA REPAIR;  Surgeon: Adin Hector, MD;  Location: [facility] ORS;  Service: General;  Laterality: Bilateral;  . NO PAST SURGERIES     . UMBILICAL HERNIA REPAIR  01/30/2012   Procedure: HERNIA REPAIR UMBILICAL ADULT;  Surgeon: Adin Hector, MD;  Location: [facility] ORS;  Service: General;  Laterality: N/A;  Primary Umbilical Hernia Repair       No family history on file.  Social History   Tobacco Use  . Smoking status: Current Some Day Smoker    Packs/day: 0.30    Years: 30.00    Pack years: 9.00    Types: Cigarettes  . Smokeless tobacco: Never Used  Substance Use Topics  . Alcohol use: Yes    Alcohol/week: 3.0 standard drinks    Types: 3 Cans of beer per week  . Drug use: No    Home Medications Prior to Admission medications   Medication Sig Start Date End Date Taking? Authorizing Provider  hydrochlorothiazide (HYDRODIURIL) 25 MG tablet Take 1 tablet (25 mg total) by mouth daily. Patient not taking: Reported on 06/09/2017 08/31/12   Rama, Venetia Maxon, MD  HYDROcodone-acetaminophen (NORCO) 5-325 MG tablet Take 1-2 tablets by mouth every 6 (six) hours as needed for moderate pain. 04/17/18   Margarita Mail, PA-C  hydrocortisone 2.5 % cream Apply 1 application topically daily. 04/05/17   [provider]  losartan (COZAAR) 25 MG tablet Take 1 tablet (25 mg total) by mouth daily. Patient not taking: Reported on 06/09/2017 07/29/12   Murlean Iba, MD  naproxen sodium (ALEVE)  220 MG tablet Take 440 mg by mouth daily as needed (Headaches, Bodyaches).    [provider]  predniSONE (DELTASONE) 20 MG tablet Take 2 tablets (40 mg total) by mouth daily with breakfast. For the next four days 06/09/17   Carmin Muskrat, MD  PRESCRIPTION MEDICATION Apply 1 application topically daily. Topical Cream; Apply to the face daily for Rosacea.    [provider]  ZITHROMAX Z-PAK 250 MG tablet Take 2 tabs today and then 1 tab every day for next 4 days. Patient not taking: Reported on 06/09/2017 08/07/12   Janell Quiet, MD    Allergies    Penicillins and Sulfa antibiotics  Review of Systems   Review of  Systems  Neurological: Positive for weakness.  All other systems reviewed and are negative.   Physical Exam Updated Vital Signs BP 124/67   Pulse 84   Temp 97.6 F (36.4 C) (Temporal)   Resp 15   Ht 5\' 7"  (1.702 m)   Wt 67 kg   SpO2 97%   BMI 23.13 kg/m   Physical Exam Vitals and nursing note reviewed.  Constitutional:      General: He is not in acute distress.    Appearance: He is well-developed and well-nourished.     Comments: Appears nontoxic  HENT:     Head: Normocephalic and atraumatic.  Eyes:     Extraocular Movements: Extraocular movements intact and EOM normal.     Conjunctiva/sclera: Conjunctivae normal.     Pupils: Pupils are equal, round, and reactive to light.  Cardiovascular:     Rate and Rhythm: Normal rate and regular rhythm.     Pulses: Normal pulses and intact distal pulses.  Pulmonary:     Effort: Pulmonary effort is normal. No respiratory distress.     Breath sounds: Normal breath sounds. No wheezing.  Abdominal:     General: There is no distension.     Palpations: Abdomen is soft. There is no mass.     Tenderness: There is no abdominal tenderness. There is no guarding or rebound.  Musculoskeletal:     Cervical back: Normal range of motion and neck supple.     Comments: No active extension of the L wrist. No pain with passive extension. Radial pulses 2+ bilaterally. Decreased grip strength of the L hand. sesnation of upper extremiteis and hands equal bilaterally.  No weakness with wrist flexion or with any active ROM of the elbow and shoulder of the LUE.  strength of lower ext equal  Skin:    General: Skin is warm and dry.     Capillary Refill: Capillary refill takes less than 2 seconds.  Neurological:     Mental Status: He is alert and oriented to person, place, and time.     GCS: GCS eye subscore is 4. GCS verbal subscore is 5. GCS motor subscore is 6.     Cranial Nerves: Cranial nerves are intact.     Sensory: Sensation is intact.     Motor:  Weakness present.  Psychiatric:        Mood and Affect: Mood and affect normal.     ED Results / Procedures / Treatments   Labs (all labs ordered are listed, but only abnormal results are displayed) Labs Reviewed  CBC - Abnormal; Notable for the following components:      Result Value   MCH 35.8 (*)    MCHC 36.3 (*)    Platelets 117 (*)    All other components within  normal limits  COMPREHENSIVE METABOLIC PANEL - Abnormal; Notable for the following components:   Sodium 132 (*)    CO2 20 (*)    Glucose, Bld 138 (*)    Creatinine, Ser 1.30 (*)    AST 116 (*)    ALT 101 (*)    All other components within normal limits  ETHANOL - Abnormal; Notable for the following components:   Alcohol, Ethyl (B) 160 (*)    All other components within normal limits  I-STAT CHEM 8, ED - Abnormal; Notable for the following components:   Sodium 132 (*)    Creatinine, Ser 1.40 (*)    Glucose, Bld 135 (*)    Calcium, Ion 0.97 (*)    TCO2 20 (*)    All other components within normal limits  CBG MONITORING, ED - Abnormal; Notable for the following components:   Glucose-Capillary 139 (*)    All other components within normal limits  PROTIME-INR  APTT  DIFFERENTIAL  URINALYSIS, ROUTINE W REFLEX MICROSCOPIC    EKG None  Radiology CT HEAD CODE STROKE WO CONTRAST  Result Date: 08/22/2020 CLINICAL DATA:  Code stroke. Initial evaluation for acute left arm numbness, weakness. EXAM: CT HEAD WITHOUT CONTRAST TECHNIQUE: Contiguous axial images were obtained from the base of the skull through the vertex without intravenous contrast. COMPARISON:  None available. FINDINGS: Brain: Age-appropriate cerebral atrophy with mild chronic small vessel ischemic disease. No acute intracranial hemorrhage. No acute large vessel territory infarct. No mass lesion, midline shift or mass effect. No hydrocephalus or extra-axial fluid collection. Vascular: No hyperdense vessel. Skull: Scalp soft tissues and calvarium within  normal limits. Sinuses/Orbits: Globes and orbital soft tissues demonstrate no acute finding. Chronic right maxillary sinusitis. Superimposed hyperdensity could reflect desiccated secretions and/or superimposed fungal elements. Paranasal sinuses are otherwise largely clear. No mastoid effusion. Other: None. ASPECTS [facility] Stroke Program Early CT Score) - Ganglionic level infarction (caudate, lentiform nuclei, internal capsule, insula, M1-M3 cortex): 7 - Supraganglionic infarction (M4-M6 cortex): 3 Total score (0-10 with 10 being normal): 10 IMPRESSION: 1. No acute intracranial infarct or other abnormality. 2. ASPECTS is 10. 3. Chronic right maxillary sinusitis. These results were communicated to Dr. Theda Sers at 8:37 pmon 3/1/2022by text page via the [facility] messaging system. Electronically Signed   By: Jeannine Boga M.D.   On: 08/22/2020 20:39    Procedures Procedures   Medications Ordered in ED Medications  sodium chloride flush (NS) 0.9 % injection 3 mL (3 mLs Intravenous Not Given 08/22/20 2126)    ED Course  I have reviewed the triage vital signs and the nursing notes.  Pertinent labs & imaging results that were available during my care of the patient were reviewed by me and considered in my medical decision making (see chart for details).    MDM Rules/Calculators/A&P                          Pt presenting for evaluation of L wrist weakness. On exam, pt with focal weakness of the radial nerve, likely nerve palsy (in the setting of etoh use, likely 2/2 compression while asleep). Pt is vascularly intact. Pt arrived as a code stroke, cancelled by neurology in the setting of single nerve palsy. No other neuro deficit.   Head ct negative. Labs overall reassuring, ethanol level elevated. discussed with Dr. Theda Sers from neurology, who agreed this is likely radial nerve palsy and does not need MRI or further emergent ER workup. Will plan for  OP management, wrist splint given in the ED. will have  pt f/u with PCP and neuro. At this time, pt appears safe for d/c. Return precautions given. Pt states he understands and agrees to plan.   Final Clinical Impression(s) / ED Diagnoses Final diagnoses:  Radial nerve palsy, left    Rx / DC Orders ED Discharge Orders    None       Franchot Heidelberg, PA-C 08/22/20 2147    Deno Etienne, DO 08/22/20 2300

## 2020-08-22 NOTE — Discharge Instructions (Addendum)
Treatment for the compressed radial nerve is symptomatic and time. Use the wrist splint as needed to maintain function. Physical therapy should be done, this should be ordered by your primary care doctor.  If you do not have a primary care doctor, call the neurology office listed below for further management. Use Tylenol or ibuprofen as needed for pain.  This likely occurred due to your alcohol drinking, which blocked the signals to your brain that said you needed to move your arm.  As such, I recommend you decrease your alcohol intake to prevent recurrence of this.  Return to the emergency room if you develop severe worsening pain, color change in your hand, or any new, worsening, or concerning symptoms

## 2020-08-23 ENCOUNTER — Encounter (HOSPITAL_COMMUNITY): Payer: Self-pay | Admitting: Neurology

## 2020-11-30 DIAGNOSIS — B182 Chronic viral hepatitis C: Secondary | ICD-10-CM | POA: Diagnosis not present

## 2020-11-30 DIAGNOSIS — K7469 Other cirrhosis of liver: Secondary | ICD-10-CM | POA: Diagnosis not present

## 2020-11-30 DIAGNOSIS — R748 Abnormal levels of other serum enzymes: Secondary | ICD-10-CM | POA: Diagnosis not present

## 2020-11-30 DIAGNOSIS — D376 Neoplasm of uncertain behavior of liver, gallbladder and bile ducts: Secondary | ICD-10-CM | POA: Diagnosis not present

## 2020-12-05 ENCOUNTER — Other Ambulatory Visit: Payer: Self-pay | Admitting: Nurse Practitioner

## 2020-12-05 DIAGNOSIS — B182 Chronic viral hepatitis C: Secondary | ICD-10-CM

## 2020-12-07 ENCOUNTER — Encounter: Payer: Self-pay | Admitting: Internal Medicine

## 2020-12-12 ENCOUNTER — Ambulatory Visit
Admission: RE | Admit: 2020-12-12 | Discharge: 2020-12-12 | Disposition: A | Discharge status: Home / Self Care | Payer: BC Managed Care – PPO | Source: Ambulatory Visit | Attending: Nurse Practitioner | Admitting: Nurse Practitioner

## 2020-12-12 DIAGNOSIS — R160 Hepatomegaly, not elsewhere classified: Secondary | ICD-10-CM | POA: Diagnosis not present

## 2020-12-12 DIAGNOSIS — B182 Chronic viral hepatitis C: Secondary | ICD-10-CM

## 2020-12-12 DIAGNOSIS — K7689 Other specified diseases of liver: Secondary | ICD-10-CM | POA: Diagnosis not present

## 2020-12-12 IMAGING — US US ABDOMEN COMPLETE
1 series · 13 of 25 positions shown · non-contrast
Comparison: None.

CLINICAL DATA: Chronic hep C

EXAM:
ABDOMEN ULTRASOUND COMPLETE

[Series 1: us abdomen complete · 0.23mm/px · 13 of 129 slices shown]
[im 1/129]
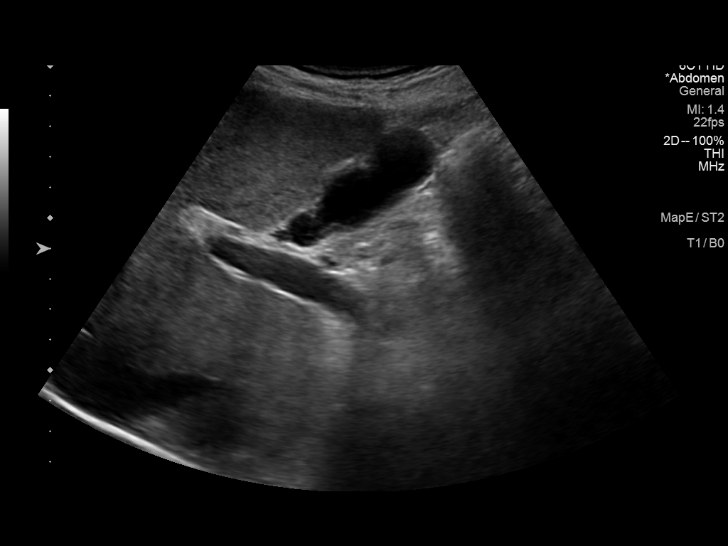
[im 11/129]
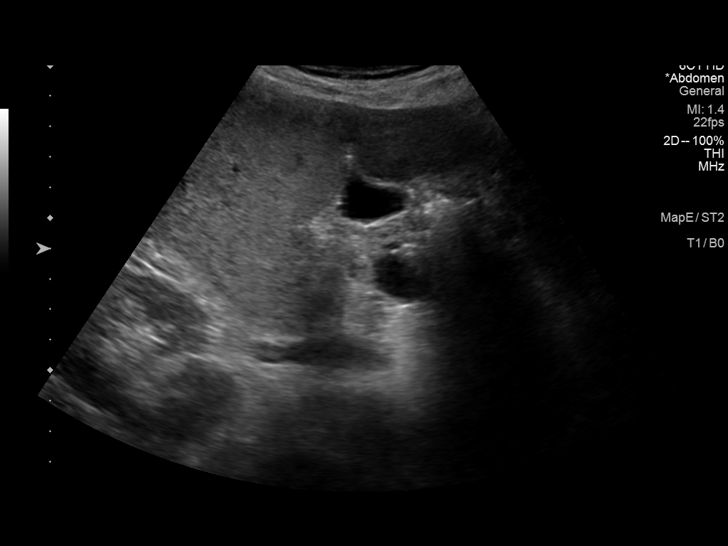
[im 22/129]
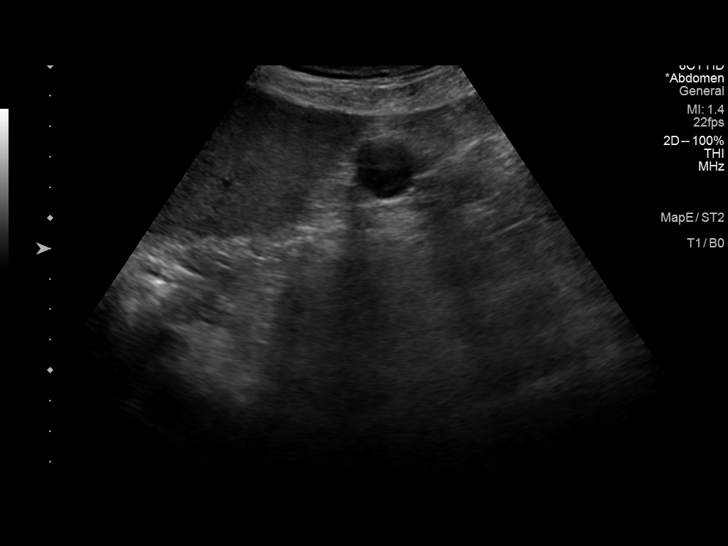
[im 33/129]
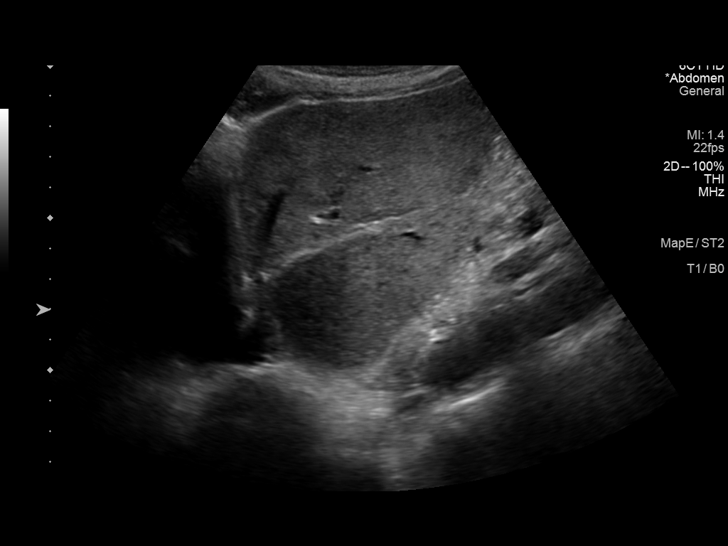
[im 43/129]
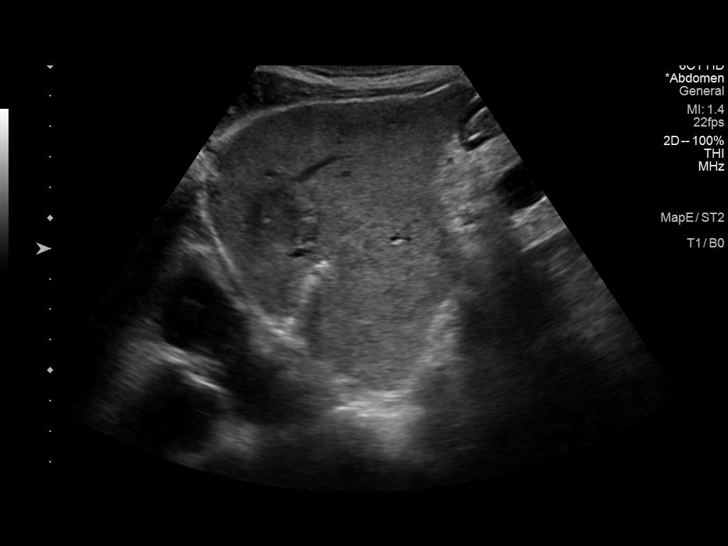
[im 54/129]
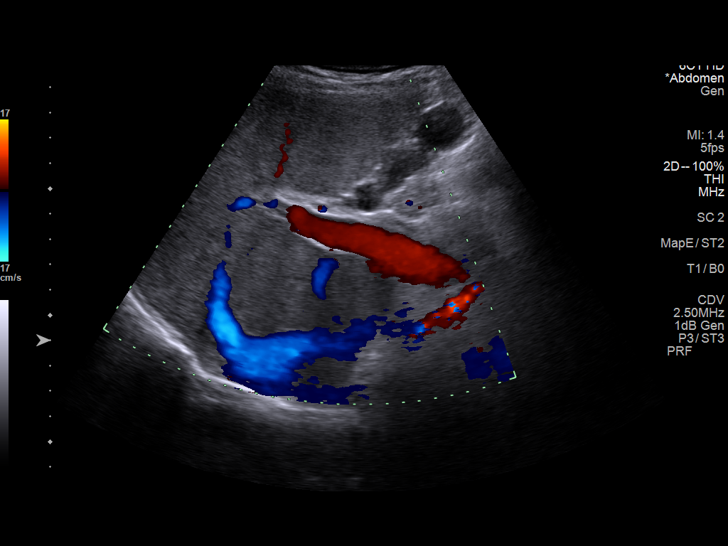
[im 65/129]
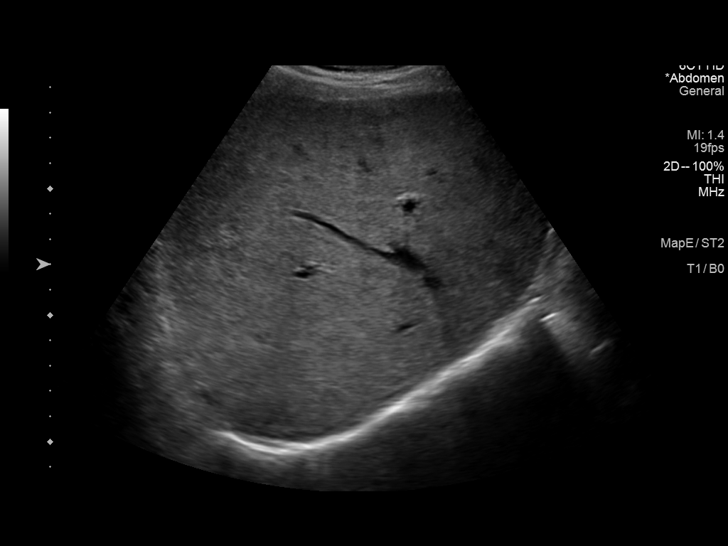
[im 75/129]
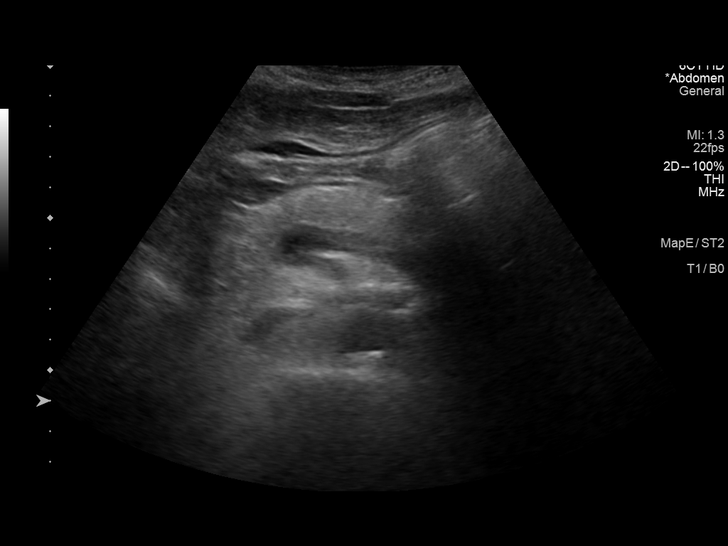
[im 86/129]
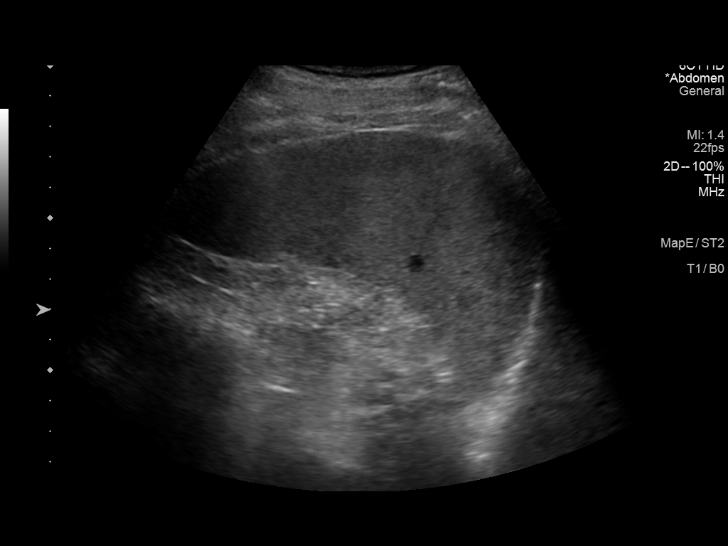
[im 97/129]
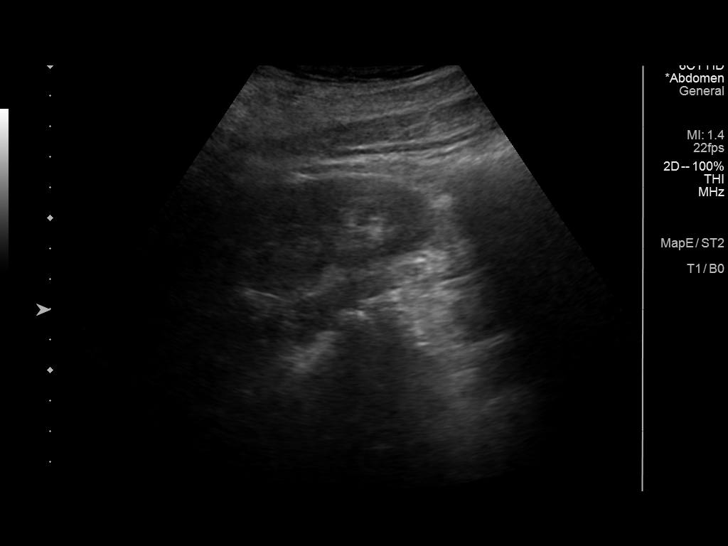
[im 107/129]
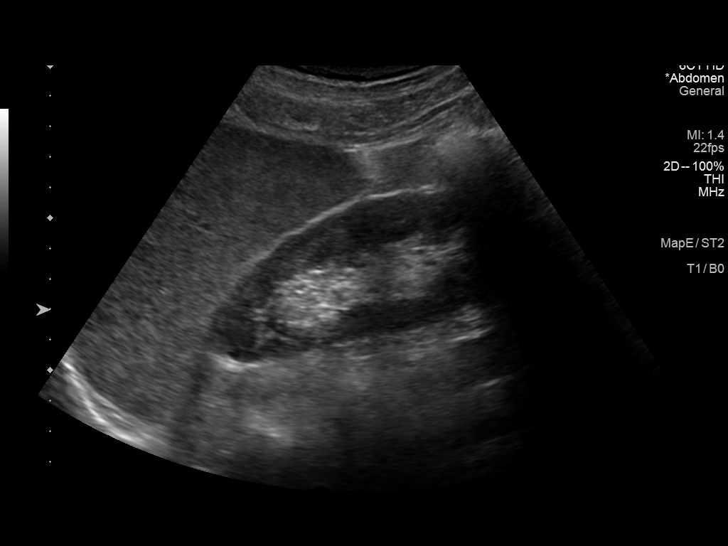
[im 118/129]
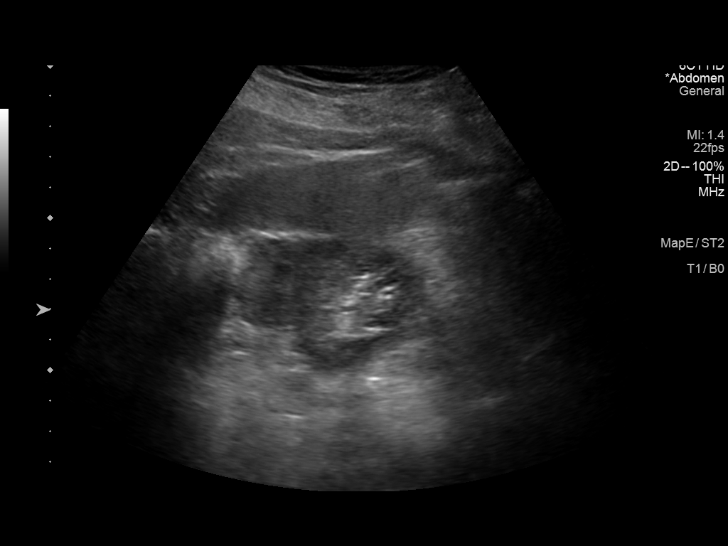
[im 129/129]
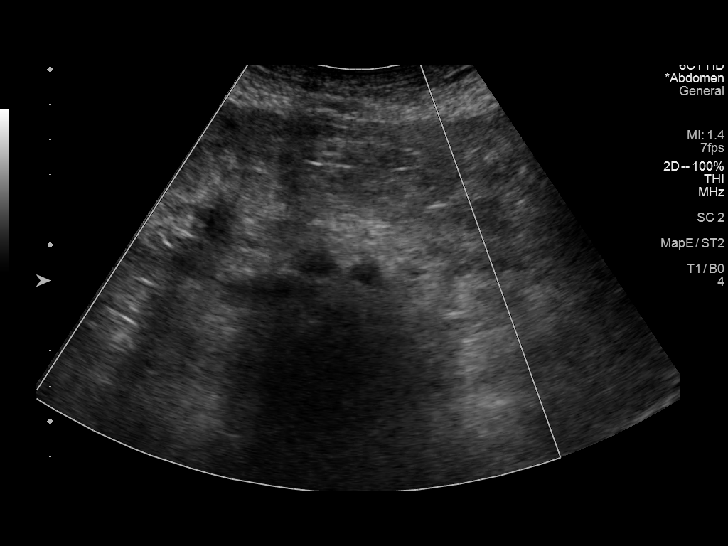

[13 of 25 positions shown; findings below may reference images not displayed]

FINDINGS: Gallbladder: No gallstones or wall thickening visualized. No
sonographic Murphy sign noted by sonographer.

Common bile duct: Diameter: 0.5 cm, within normal limits

Liver: There is a subtle heterogeneous mass in the left hepatic lobe
measuring 2.6 x 2.6 x 2.4 cm. Diffusely increased liver parenchymal
echogenicity. Portal vein is patent on color Doppler imaging with
normal direction of blood flow towards the liver.

IVC: No abnormality visualized.

Pancreas: Visualized portion unremarkable.

Spleen: Size and appearance within normal limits.

Right Kidney: Length: 11.6 cm. Echogenicity within normal limits. No
mass or hydronephrosis visualized.

Left Kidney: Length: 11.6 cm. Echogenicity within normal limits. No
mass or hydronephrosis visualized.

Abdominal aorta: No aneurysm visualized.

Other findings: None.
IMPRESSION: Indeterminate heterogeneous mass in the left hepatic lobe measuring
2.6 cm. Contrast enhanced MRI of the liver is recommended for
further evaluation.

These results will be called to the ordering clinician or
representative by the Radiologist Assistant, and communication
documented in the PACS or [REDACTED].

## 2020-12-18 ENCOUNTER — Other Ambulatory Visit: Payer: Self-pay | Admitting: Nurse Practitioner

## 2020-12-18 DIAGNOSIS — D376 Neoplasm of uncertain behavior of liver, gallbladder and bile ducts: Secondary | ICD-10-CM

## 2020-12-20 ENCOUNTER — Ambulatory Visit
Admission: RE | Admit: 2020-12-20 | Discharge: 2020-12-20 | Disposition: A | Discharge status: Home / Self Care | Payer: BC Managed Care – PPO | Source: Ambulatory Visit | Attending: Nurse Practitioner | Admitting: Nurse Practitioner

## 2020-12-20 DIAGNOSIS — B1920 Unspecified viral hepatitis C without hepatic coma: Secondary | ICD-10-CM | POA: Diagnosis not present

## 2020-12-20 DIAGNOSIS — D1800 Hemangioma unspecified site: Secondary | ICD-10-CM | POA: Diagnosis not present

## 2020-12-20 DIAGNOSIS — Q279 Congenital malformation of peripheral vascular system, unspecified: Secondary | ICD-10-CM | POA: Diagnosis not present

## 2020-12-20 DIAGNOSIS — K7689 Other specified diseases of liver: Secondary | ICD-10-CM | POA: Diagnosis not present

## 2020-12-20 DIAGNOSIS — D376 Neoplasm of uncertain behavior of liver, gallbladder and bile ducts: Secondary | ICD-10-CM

## 2020-12-20 IMAGING — MR MR ABDOMEN WO/W CM
12 of 17 series · 26 of 48 positions shown · IV contrast (13 ml multihance)
Comparison: Ultrasound [DATE]

CLINICAL DATA: Further characterization of lesion seen on prior
ultrasound. History of hepatitis C.

EXAM:
MRI ABDOMEN WITHOUT AND WITH CONTRAST
TECHNIQUE: Multiplanar multisequence MR imaging of the abdomen was performed
both before and after the administration of intravenous contrast.
CONTRAST:  13mL MULTIHANCE GADOBENATE DIMEGLUMINE 529 MG/ML IV SOLN

[Series 3: T2 · coronal · 5.0mm · 1.56mm/px · 1 of 34 slices shown (1 of 3)]
[im 1/34]
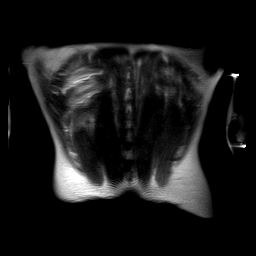

[Series 4: axial tru fisp · axial · 5.0mm · 1.41mm/px · 1 of 42 slices shown]
[im 1/42]
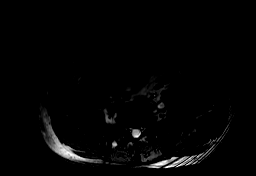

[Series 5: T2 · axial · 6.5mm · 0.72mm/px · 1 of 30 slices shown (2 of 3)]
[im 1/30]
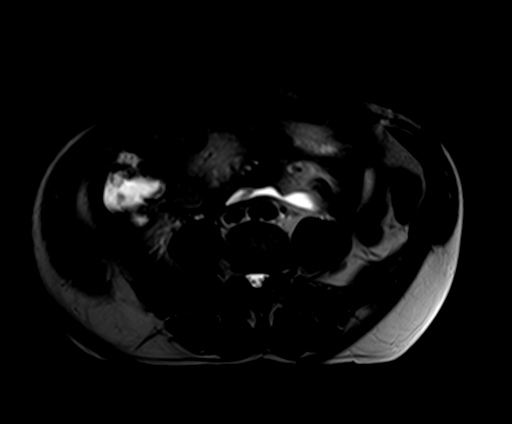

[Series 6: [id number] · axial · 6.0mm · 1.98mm/px · z∈[-165,+61]mm · 2 of 90 slices shown]
[im 1/90]
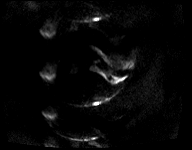
[im 90/90]
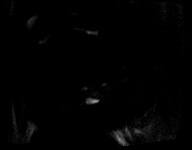

[Series 7: [id number]_adc · axial · 6.0mm · 1.98mm/px · 1 of 30 slices shown]
[im 1/30]
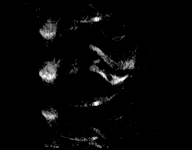

[Series 8: T2 · axial · 5.0mm · 1.37mm/px · 1 of 35 slices shown (3 of 3)]
[im 1/35]
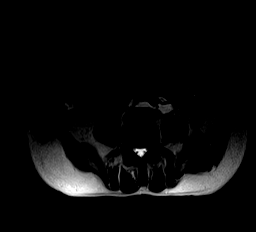

[Series 9: axial in out · axial · 6.0mm · 0.74mm/px · z∈[-205,+36]mm · 2 of 72 slices shown]
[im 1/72]
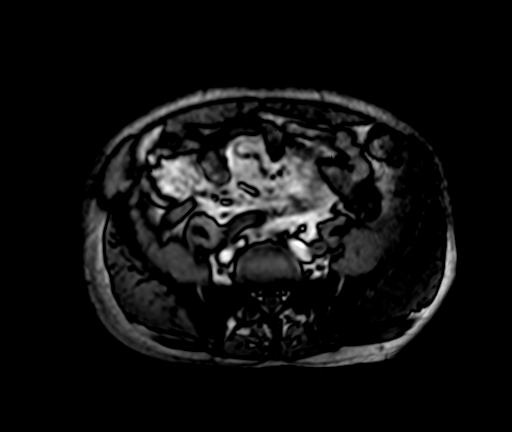
[im 72/72]
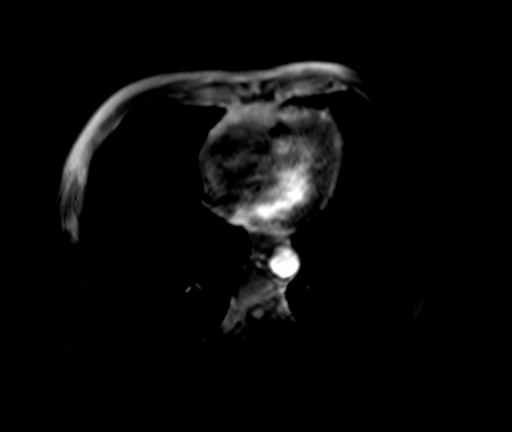

[Series 10: T1 dynamic · axial · non-contrast · 2.2mm · 0.74mm/px · z∈[-172,+37]mm · 4 of 96 slices shown]
[im 1/96]
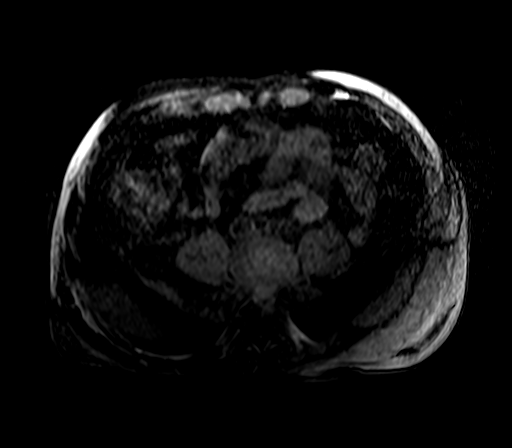
[im 32/96]
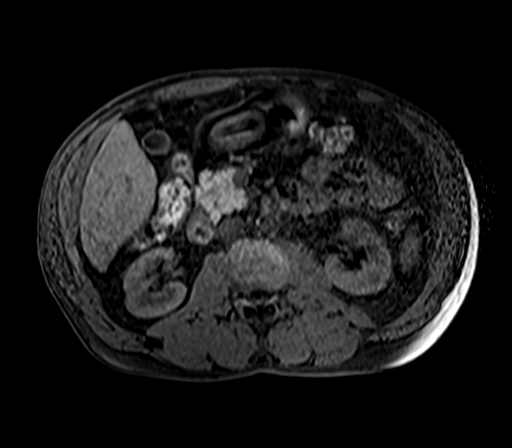
[im 64/96]
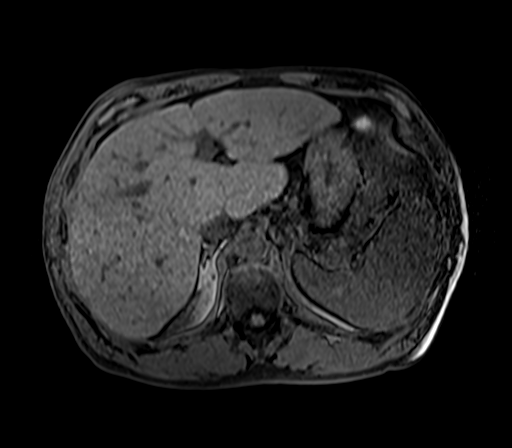
[im 96/96]
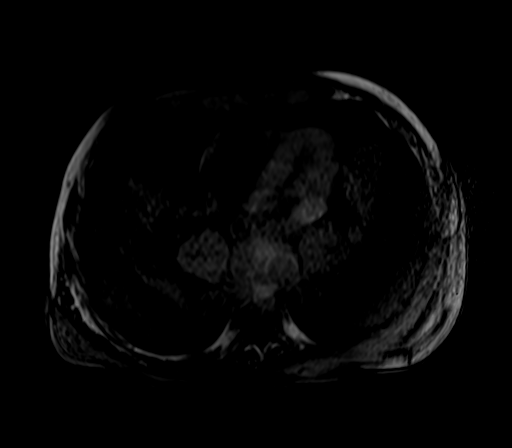

[Series 11: post 25 sec · axial · 2.2mm · 0.74mm/px · z∈[-172,+37]mm · 4 of 96 slices shown]
[im 1/96]
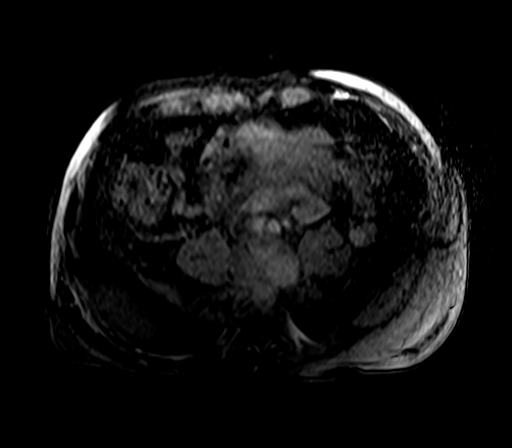
[im 32/96]
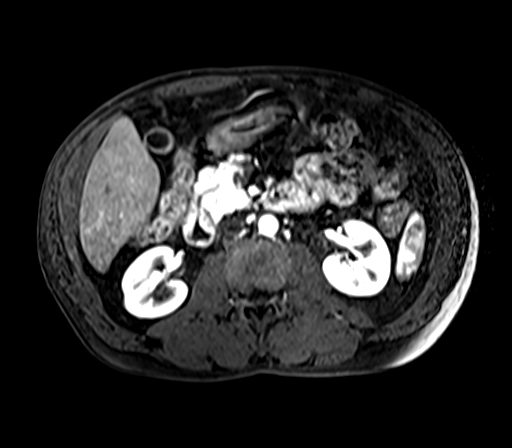
[im 64/96]
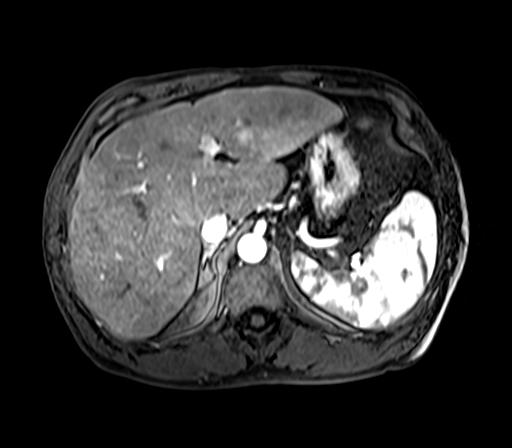
[im 96/96]
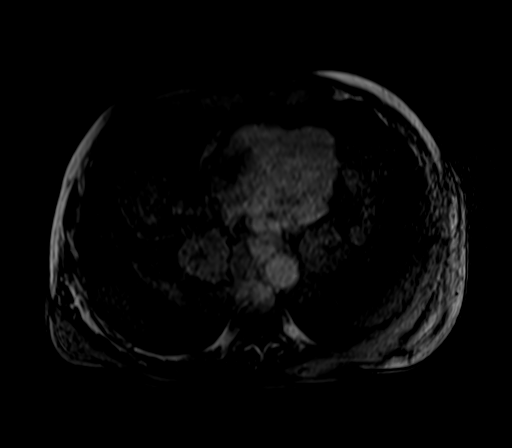

[Series 12: post 25 sec_sub · axial · 2.2mm · 0.74mm/px · z∈[-172,+37]mm · 4 of 96 slices shown]
[im 1/96]
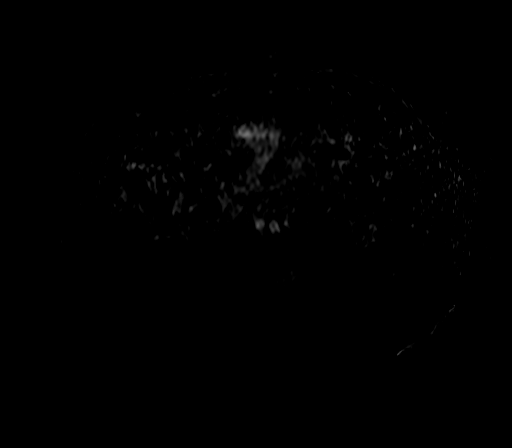
[im 32/96]
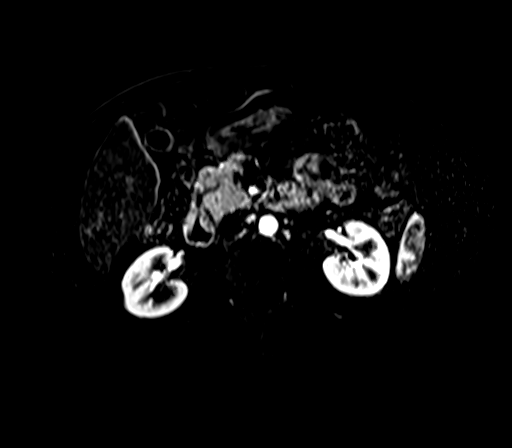
[im 64/96]
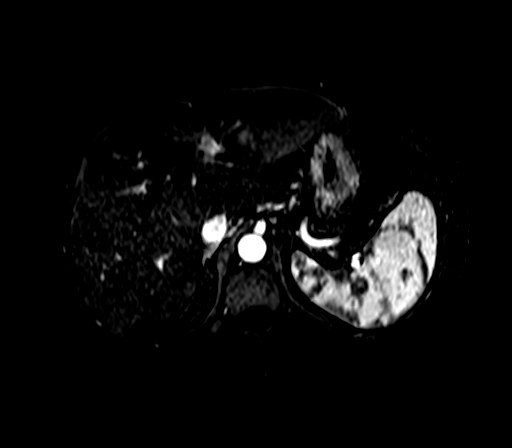
[im 96/96]
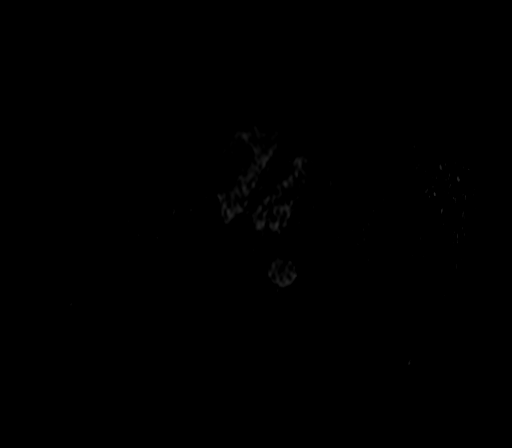

[Series 13: post 45 sec · axial · 2.2mm · 0.74mm/px · z∈[-172,+37]mm · 4 of 96 slices shown]
[im 1/96]
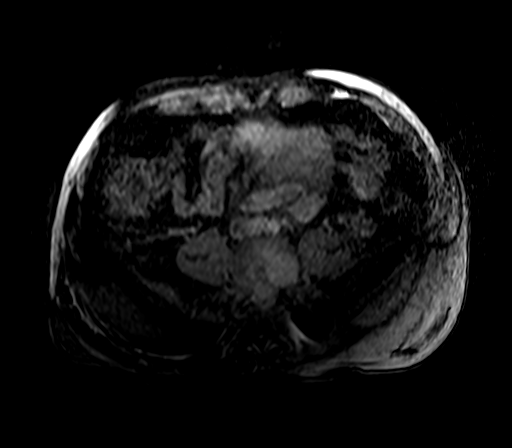
[im 32/96]
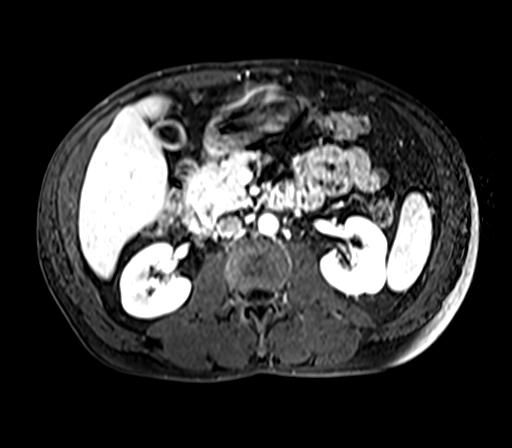
[im 64/96]
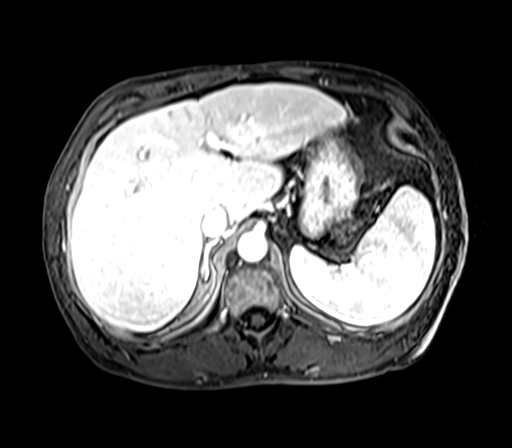
[im 96/96]
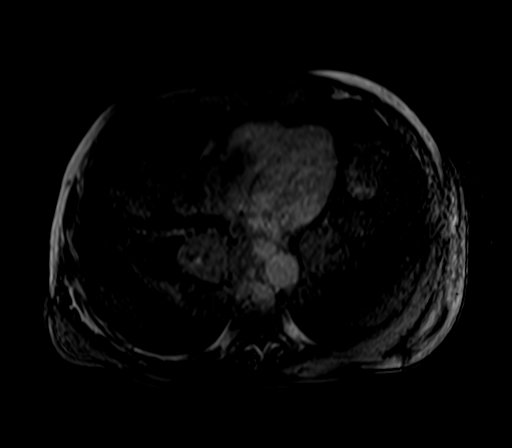

[Series 14: post 45 sec_sub · axial · 2.2mm · 0.74mm/px · 1 of 96 slices shown]
[im 1/96]
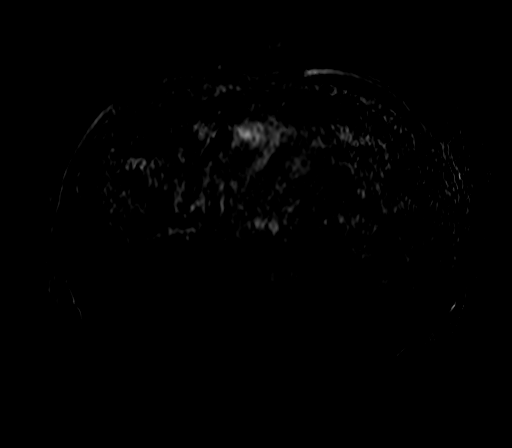

[26 of 48 positions shown; findings below may reference images not displayed]

FINDINGS: Lower chest: No acute findings.

Hepatobiliary: Widening of the hepatic fissures with enlargement of
the caudate and left lobe of the liver and hepatic contour
nodularity. Arterially enhancing segment II hepatic lesion measuring
2.5 x 2.0 cm on image [DATE], the lesion is demonstrates mild T2
hyperintensity, demonstrating washout with enhancing capsule,
consistent with WILLIAM CESAR category 5 hepatic lesion. There are few
other tiny foci of arterial enhancement measuring up to 5 mm for
instance in segment III on image 52/11, along the falciform ligament
on image 37/11 and in the right lobe of the liver on image 43/11 and
48/11, these do not demonstrate washout or other suspicious
features, and are most consistent with WILLIAM CESAR category 3 hepatic
lesion.

Gallbladder is unremarkable.  No biliary ductal dilation.

Pancreas: No mass, inflammatory changes, or other parenchymal
abnormality identified. No pancreatic ductal dilation. Pancreatic
divisum.

Spleen: Borderline splenomegaly measuring 13 cm in maximum
craniocaudal dimension.

Adrenals/Urinary Tract: No masses identified. No evidence of
hydronephrosis.

Stomach/Bowel: Visualized portions within the abdomen are
unremarkable.

Vascular/Lymphatic: No abdominal aortic aneurysm. No evidence of
tumor in vein. The portal, splenic and superior mesenteric veins
appear patent. Hepatic veins are patent. Accessory left hepatic
artery from the left gastric. Prominent periportal lymph nodes
measuring up to 6 mm. No pathologically enlarged abdominal lymph
nodes.

Other:  No abdominopelvic ascites.

Musculoskeletal: L1 vertebral body hemangioma. No suspicious osseous
lesions.
IMPRESSION: 1. WILLIAM CESAR category 5 hepatic lesion in segment II measuring 2.5 x
2.0 cm. No evidence of tumor in vein. No evidence of abdominal
metastases.
2. WILLIAM CESAR category 3 hepatic lesions, as above.
3. Morphologic changes of the liver compatible with cirrhosis.
4. Borderline splenomegaly.

These results will be called to the ordering clinician or
representative by the Radiologist Assistant, and communication
documented in the PACS or [REDACTED].

## 2020-12-20 MED ORDER — GADOBENATE DIMEGLUMINE 529 MG/ML IV SOLN
13.0000 mL | Freq: Once | INTRAVENOUS | Status: AC | PRN
  Administered 2020-12-20: 13 mL via INTRAVENOUS

## 2020-12-22 ENCOUNTER — Other Ambulatory Visit: Payer: Self-pay | Admitting: Nurse Practitioner

## 2020-12-22 DIAGNOSIS — C220 Liver cell carcinoma: Secondary | ICD-10-CM

## 2020-12-26 ENCOUNTER — Encounter: Payer: Self-pay | Admitting: *Deleted

## 2020-12-26 ENCOUNTER — Ambulatory Visit
Admission: RE | Admit: 2020-12-26 | Discharge: 2020-12-26 | Disposition: A | Discharge status: Home / Self Care | Payer: BC Managed Care – PPO | Source: Ambulatory Visit | Attending: Nurse Practitioner | Admitting: Nurse Practitioner

## 2020-12-26 DIAGNOSIS — C220 Liver cell carcinoma: Secondary | ICD-10-CM | POA: Diagnosis not present

## 2020-12-26 HISTORY — PX: IR RADIOLOGIST EVAL & MGMT: IMG5224

## 2020-12-26 NOTE — Consult Note (Signed)
Chief Complaint: Patient was seen in consultation today for hepatocellular carcinoma at the request of Drazek,Dawn  Referring Provider: Roosevelt Locks  History of Present Illness: Levi Conner is a 65 y.o. male with a history of elevated liver enzymes that led to work-up revealing positive hepatitis C RNA and additional confirmation of hepatitis C, genotype 3 with viral load of 1.8 million.  At that time he also had a longstanding history of heavy alcohol use.  Ultrasound demonstrated an ill-defined mass in the left lobe of the liver estimated to be approximately 2.6 cm in greatest diameter. This led to MRI of the abdomen on 12/20/2020 demonstrating an avidly enhancing mass in segment II of the liver measuring approximately 2.5 x 2.0 cm on axial imaging and demonstrating washout with enhancing capsule consistent with a LI-RADS category 5 lesion (definite HCC).  No other definite carcinoma in the liver with some small foci of arterial enhancement noted that do not demonstrate washout.  Levi Conner has stated that he has now stopped drinking alcohol for the last 30 days.  He is currently smoking approximately half a pack of cigarettes per day.  He denies any abdominal pain.  Appetite has been normal.  He has gained a slight amount in weight since he has stopped drinking.  Past Medical History:  Diagnosis Date   Borderline diabetic    diet controlled, pt does not check cbg   Diabetes mellitus    no medications   Hernia, inguinal, bilateral    current   Hypertension    Pneumonia 10-2011   HOSPITALIZED FOR PNEUMONIA MAY 2013.  PT HAS PULMONARY CLEARANCE FROM DR. WERT FOR HERNIA REPAIR WITH DR. Johney Maine   Rash  since 7-8-201   red rash around bellybutton and belt line and both feet  - resolved    Past Surgical History:  Procedure Laterality Date   ESOPHAGOGASTRODUODENOSCOPY  03/26/2012   Procedure: ESOPHAGOGASTRODUODENOSCOPY (EGD);  Surgeon: Beryle Beams, MD;  Location: [facility] ENDOSCOPY;   Service: Endoscopy;  Laterality: N/A;   HERNIA REPAIR  01/30/2012   umb hernia   INGUINAL HERNIA REPAIR  01/30/2012   Procedure: LAPAROSCOPIC BILATERAL INGUINAL HERNIA REPAIR;  Surgeon: Adin Hector, MD;  Location: [facility] ORS;  Service: General;  Laterality: Bilateral;   IR RADIOLOGIST EVAL & MGMT  12/26/2020   NO PAST SURGERIES     UMBILICAL HERNIA REPAIR  01/30/2012   Procedure: HERNIA REPAIR UMBILICAL ADULT;  Surgeon: Adin Hector, MD;  Location: [facility] ORS;  Service: General;  Laterality: N/A;  Primary Umbilical Hernia Repair    Allergies: Penicillins and Sulfa antibiotics  Medications: Prior to Admission medications   Medication Sig Start Date End Date Taking? Authorizing Provider  hydrochlorothiazide (HYDRODIURIL) 25 MG tablet Take 1 tablet (25 mg total) by mouth daily. Patient not taking: Reported on 06/09/2017 08/31/12   Rama, Venetia Maxon, MD  HYDROcodone-acetaminophen (NORCO) 5-325 MG tablet Take 1-2 tablets by mouth every 6 (six) hours as needed for moderate pain. 04/17/18   Margarita Mail, PA-C  hydrocortisone 2.5 % cream Apply 1 application topically daily. 04/05/17   [provider]  losartan (COZAAR) 25 MG tablet Take 1 tablet (25 mg total) by mouth daily. Patient not taking: Reported on 06/09/2017 07/29/12   Murlean Iba, MD  naproxen sodium (ALEVE) 220 MG tablet Take 440 mg by mouth daily as needed (Headaches, Bodyaches).    [provider]  predniSONE (DELTASONE) 20 MG tablet Take 2 tablets (40 mg total) by mouth daily  with breakfast. For the next four days 06/09/17   Carmin Muskrat, MD  PRESCRIPTION MEDICATION Apply 1 application topically daily. Topical Cream; Apply to the face daily for Rosacea.    [provider]  ZITHROMAX Z-PAK 250 MG tablet Take 2 tabs today and then 1 tab every day for next 4 days. Patient not taking: Reported on 06/09/2017 08/07/12   Janell Quiet, MD     No family history on file.  Social History   Socioeconomic  History   Marital status: Single    Spouse name: Not on file   Number of children: Not on file   Years of education: Not on file   Highest education level: Not on file  Occupational History   Occupation: Architect  Tobacco Use   Smoking status: Some Days    Packs/day: 0.30    Years: 30.00    Pack years: 9.00    Types: Cigarettes   Smokeless tobacco: Never  Substance and Sexual Activity   Alcohol use: Yes    Alcohol/week: 3.0 standard drinks    Types: 3 Cans of beer per week   Drug use: No   Sexual activity: Not Currently  Other Topics Concern   Not on file  Social History Narrative   Is concerned about a significant mold problem in his friend's home where he is living.    Social Determinants of Health   Financial Resource Strain: Not on file  Food Insecurity: Not on file  Transportation Needs: Not on file  Physical Activity: Not on file  Stress: Not on file  Social Connections: Not on file    ECOG Status: 0 - Asymptomatic  Review of Systems: A 12 point ROS discussed and pertinent positives are indicated in the HPI above.  All other systems are negative.  Review of Systems  Constitutional: Negative.   Respiratory: Negative.    Cardiovascular: Negative.   Gastrointestinal: Negative.   Genitourinary: Negative.   Musculoskeletal: Negative.   Neurological: Negative.    Vital Signs: BP 131/76 (BP Location: Right Arm)   Pulse (!) 103   SpO2 97%   Physical Exam Vitals reviewed.  Constitutional:      General: He is not in acute distress.    Appearance: Normal appearance. He is not ill-appearing, toxic-appearing or diaphoretic.  HENT:     Head: Normocephalic and atraumatic.  Cardiovascular:     Rate and Rhythm: Normal rate and regular rhythm.     Heart sounds: Normal heart sounds. No murmur heard.   No friction rub. No gallop.  Pulmonary:     Effort: Pulmonary effort is normal. No respiratory distress.     Breath sounds: Normal breath sounds. No stridor.  No wheezing, rhonchi or rales.  Abdominal:     General: Bowel sounds are normal. There is no distension.     Palpations: Abdomen is soft.     Tenderness: There is no abdominal tenderness. There is no guarding or rebound.     Hernia: No hernia is present.  Musculoskeletal:        General: No swelling.     Cervical back: Neck supple.  Skin:    General: Skin is warm and dry.     Coloration: Skin is not jaundiced.  Neurological:     General: No focal deficit present.     Mental Status: He is alert and oriented to person, place, and time.     Imaging: MR ABDOMEN WWO CONTRAST  Result Date: 12/20/2020 CLINICAL DATA:  Further characterization  of lesion seen on prior ultrasound. History of hepatitis C. EXAM: MRI ABDOMEN WITHOUT AND WITH CONTRAST TECHNIQUE: Multiplanar multisequence MR imaging of the abdomen was performed both before and after the administration of intravenous contrast. CONTRAST:  15m MULTIHANCE GADOBENATE DIMEGLUMINE 529 MG/ML IV SOLN COMPARISON:  Ultrasound December 12, 2020 FINDINGS: Lower chest: No acute findings. Hepatobiliary: Widening of the hepatic fissures with enlargement of the caudate and left lobe of the liver and hepatic contour nodularity. Arterially enhancing segment II hepatic lesion measuring 2.5 x 2.0 cm on image 28/11, the lesion is demonstrates mild T2 hyperintensity, demonstrating washout with enhancing capsule, consistent with a LI-RADS category 5 hepatic lesion. There are few other tiny foci of arterial enhancement measuring up to 5 mm for instance in segment III on image 52/11, along the falciform ligament on image 37/11 and in the right lobe of the liver on image 43/11 and 48/11, these do not demonstrate washout or other suspicious features, and are most consistent with a LI-RADS category 3 hepatic lesion. Gallbladder is unremarkable.  No biliary ductal dilation. Pancreas: No mass, inflammatory changes, or other parenchymal abnormality identified. No pancreatic  ductal dilation. Pancreatic divisum. Spleen: Borderline splenomegaly measuring 13 cm in maximum craniocaudal dimension. Adrenals/Urinary Tract: No masses identified. No evidence of hydronephrosis. Stomach/Bowel: Visualized portions within the abdomen are unremarkable. Vascular/Lymphatic: No abdominal aortic aneurysm. No evidence of tumor in vein. The portal, splenic and superior mesenteric veins appear patent. Hepatic veins are patent. Accessory left hepatic artery from the left gastric. Prominent periportal lymph nodes measuring up to 6 mm. No pathologically enlarged abdominal lymph nodes. Other:  No abdominopelvic ascites. Musculoskeletal: L1 vertebral body hemangioma. No suspicious osseous lesions. IMPRESSION: 1. LI-RADS category 5 hepatic lesion in segment II measuring 2.5 x 2.0 cm. No evidence of tumor in vein. No evidence of abdominal metastases. 2. LI-RADS category 3 hepatic lesions, as above. 3. Morphologic changes of the liver compatible with cirrhosis. 4. Borderline splenomegaly. These results will be called to the ordering clinician or representative by the Radiologist Assistant, and communication documented in the PACS or [employer] Electronically Signed   By: JDahlia BailiffMD   On: 12/20/2020 15:26   [place]domen Complete  Result Date: 12/12/2020 CLINICAL DATA:  Chronic hep C EXAM: ABDOMEN ULTRASOUND COMPLETE COMPARISON:  None. FINDINGS: Gallbladder: No gallstones or wall thickening visualized. No sonographic Murphy sign noted by sonographer. Common bile duct: Diameter: 0.5 cm, within normal limits Liver: There is a subtle heterogeneous mass in the left hepatic lobe measuring 2.6 x 2.6 x 2.4 cm. Diffusely increased liver parenchymal echogenicity. Portal vein is patent on color Doppler imaging with normal direction of blood flow towards the liver. IVC: No abnormality visualized. Pancreas: Visualized portion unremarkable. Spleen: Size and appearance within normal limits. Right Kidney: Length:  11.6 cm. Echogenicity within normal limits. No mass or hydronephrosis visualized. Left Kidney: Length: 11.6 cm. Echogenicity within normal limits. No mass or hydronephrosis visualized. Abdominal aorta: No aneurysm visualized. Other findings: None. IMPRESSION: Indeterminate heterogeneous mass in the left hepatic lobe measuring 2.6 cm. Contrast enhanced MRI of the liver is recommended for further evaluation. These results will be called to the ordering clinician or representative by the Radiologist Assistant, and communication documented in the PACS or [employer] Electronically Signed   By: NAudie PintoM.D.   On: 12/12/2020 11:23   IR Radiologist Eval & Mgmt  Result Date: 12/26/2020 Please refer to notes tab for details about interventional procedure. (Op Note)   Labs:  CBC:  Recent Labs    08/22/20 2023 08/22/20 2029  WBC 5.8  --   HGB 16.4 16.3  HCT 45.2 48.0  PLT 117*  --     COAGS: Recent Labs    08/22/20 2023  INR 1.1  APTT 31    BMP: Recent Labs    08/22/20 2023 08/22/20 2029  NA 132* 132*  K 3.9 3.9  CL 100 102  CO2 20*  --   GLUCOSE 138* 135*  BUN 18 21  CALCIUM 9.8  --   CREATININE 1.30* 1.40*  GFRNONAA >60  --     LIVER FUNCTION TESTS: Recent Labs    08/22/20 2023  BILITOT 1.2  AST 116*  ALT 101*  ALKPHOS 95  PROT 7.5  ALBUMIN 3.5     Assessment and Plan:  I met with Levi Conner and reviewed imaging from the MRI study.  This demonstrates a well-circumscribed mass consistent with hepatocellular carcinoma measuring up to approximately 2.6 cm in diameter by my measurements.  This is well centered in the left lobe and very treatable by percutaneous thermal ablation.  The lesion was well visualized by ultrasound.  Separate biopsy is not necessary given the high degree of certainty that this represents hepatocellular carcinoma by imaging and evidence of cirrhosis and risk factors for [place] clinically.  I discussed details of thermal ablation  with Levi Conner including need for general anesthesia and possible overnight observation.  He is agreeable with proceeding with scheduling an ablation procedure and would like to undergo ablation as soon as possible.  Hepatitis C antiviral treatment is being withheld until after the hepatoma is treated.  Thank you for this interesting consult.  I greatly enjoyed meeting Levi Conner and look forward to participating in their care.  A copy of this report was sent to the requesting provider on this date.  Electronically Signed: Azzie Roup 12/26/2020, 3:38 PM    I spent a total of 30 Minutes  in face to face in clinical consultation, greater than 50% of which was counseling/coordinating care for hepatocellular carcinoma.

## 2020-12-28 ENCOUNTER — Other Ambulatory Visit (HOSPITAL_COMMUNITY): Payer: Self-pay | Admitting: Interventional Radiology

## 2020-12-28 DIAGNOSIS — C220 Liver cell carcinoma: Secondary | ICD-10-CM

## 2021-01-02 DIAGNOSIS — B182 Chronic viral hepatitis C: Secondary | ICD-10-CM | POA: Diagnosis not present

## 2021-01-02 DIAGNOSIS — R42 Dizziness and giddiness: Secondary | ICD-10-CM | POA: Diagnosis not present

## 2021-01-02 DIAGNOSIS — C220 Liver cell carcinoma: Secondary | ICD-10-CM | POA: Diagnosis not present

## 2021-01-02 DIAGNOSIS — E119 Type 2 diabetes mellitus without complications: Secondary | ICD-10-CM | POA: Diagnosis not present

## 2021-01-04 ENCOUNTER — Other Ambulatory Visit: Payer: Self-pay | Admitting: Radiology

## 2021-01-04 NOTE — Progress Notes (Signed)
Orders request via P.A. line 

## 2021-01-10 NOTE — Patient Instructions (Signed)
DUE TO COVID-19 ONLY ONE VISITOR IS ALLOWED TO COME WITH YOU AND STAY IN THE WAITING ROOM ONLY DURING PRE OP AND PROCEDURE DAY OF SURGERY.   TWO VISITOR  MAY VISIT WITH YOU AFTER SURGERY IN YOUR PRIVATE ROOM DURING VISITING HOURS ONLY!  YOU NEED TO HAVE A COVID 19 TEST ON__8-1-22_____ @_______ , THIS TEST MUST BE DONE BEFORE SURGERY,    COVID TESTING SITE   [place], [place] TEST IS COMPLETED,  PLEASE Wear a mask when in public           Your procedure is scheduled on: 01-24-21   Report to [facility] Main  Entrance   Report to admitting at      1000 AM     Call this number if you have problems the morning of surgery [phone]    Remember: Do not eat food or drink liquids :After Midnight.   BRUSH YOUR TEETH MORNING OF SURGERY AND RINSE YOUR MOUTH OUT, NO CHEWING GUM CANDY OR MINTS.     Take these medicines the morning of surgery with A SIP OF WATER: NONE                                 You may not have any metal on your body including hair pins and              piercings  Do not wear jewelry, lotions, powders or perfumes, deodorant                    Men may shave face and neck.   Do not bring valuables to the hospital. [place].  Contacts, dentures or bridgework may not be worn into surgery.       Patients discharged the day of surgery will not be allowed to drive home. IF YOU ARE HAVING SURGERY AND GOING HOME THE SAME DAY, YOU MUST HAVE AN ADULT TO DRIVE YOU HOME AND BE WITH YOU FOR 24 HOURS. YOU MAY GO HOME BY TAXI OR UBER OR ORTHERWISE, BUT AN ADULT MUST ACCOMPANY YOU HOME AND STAY WITH YOU FOR 24 HOURS.  Name and phone number of your driver:  Special Instructions: N/A              Please read over the following fact sheets you were given: _____________________________________________________________________             [facility] - Preparing for  Surgery Before surgery, you can play an important role.  Because skin is not sterile, your skin needs to be as free of germs as possible.  You can reduce the number of germs on your skin by washing with CHG (chlorahexidine gluconate) soap before surgery.  CHG is an antiseptic cleaner which kills germs and bonds with the skin to continue killing germs even after washing. Please DO NOT use if you have an allergy to CHG or antibacterial soaps.  If your skin becomes reddened/irritated stop using the CHG and inform your nurse when you arrive at Short Stay. Do not shave (including legs and underarms) for at least 48 hours prior to the first CHG shower.  You may shave your face/neck. Please follow these instructions carefully:  1.  Shower with CHG Soap the night before surgery and  the  morning of Surgery.  2.  If you choose to wash your hair, wash your hair first as usual with your  normal  shampoo.  3.  After you shampoo, rinse your hair and body thoroughly to remove the  shampoo.                           4.  Use CHG as you would any other liquid soap.  You can apply chg directly  to the skin and wash                       Gently with a scrungie or clean washcloth.  5.  Apply the CHG Soap to your body ONLY FROM THE NECK DOWN.   Do not use on face/ open                           Wound or open sores. Avoid contact with eyes, ears mouth and genitals (private parts).                       Wash face,  Genitals (private parts) with your normal soap.             6.  Wash thoroughly, paying special attention to the area where your surgery  will be performed.  7.  Thoroughly rinse your body with warm water from the neck down.  8.  DO NOT shower/wash with your normal soap after using and rinsing off  the CHG Soap.                9.  Pat yourself dry with a clean towel.            10.  Wear clean pajamas.            11.  Place clean sheets on your bed the night of your first shower and do not  sleep with pets. Day  of Surgery : Do not apply any lotions/deodorants the morning of surgery.  Please wear clean clothes to the hospital/surgery center.  FAILURE TO FOLLOW THESE INSTRUCTIONS MAY RESULT IN THE CANCELLATION OF YOUR SURGERY PATIENT SIGNATURE_________________________________  NURSE SIGNATURE__________________________________  ________________________________________________________________________

## 2021-01-10 NOTE — Progress Notes (Addendum)
PCP - Roosevelt Locks , CRNP Cardiologist - no  PPM/ICD -  Device Orders -  Rep Notified -   Chest x-ray -  EKG -  Stress Test -  ECHO -  Cardiac Cath -   Sleep Study -  CPAP -   Fasting Blood Sugar -  Checks Blood Sugar _____ times a day  Blood Thinner Instructions: Aspirin Instructions:  ERAS Protcol - PRE-SURGERY Ensure or G2-   COVID TEST- 01-22-21  Activity--Able to walk a flight of stairs without SOB  Anesthesia review: HTN , Hepatitis  Patient denies shortness of breath, fever, cough and chest pain at PAT appointment   All instructions explained to the patient, with a verbal understanding of the material. Patient agrees to go over the instructions while at home for a better understanding. Patient also instructed to self quarantine after being tested for COVID-19. The opportunity to ask questions was provided.

## 2021-01-12 ENCOUNTER — Encounter (HOSPITAL_COMMUNITY)
Admission: RE | Admit: 2021-01-12 | Discharge: 2021-01-12 | Disposition: A | Discharge status: Home / Self Care | Payer: BC Managed Care – PPO | Source: Ambulatory Visit | Attending: Interventional Radiology | Admitting: Interventional Radiology

## 2021-01-12 ENCOUNTER — Other Ambulatory Visit: Payer: Self-pay

## 2021-01-12 ENCOUNTER — Encounter (HOSPITAL_COMMUNITY): Payer: Self-pay

## 2021-01-12 DIAGNOSIS — Z01818 Encounter for other preprocedural examination: Secondary | ICD-10-CM | POA: Insufficient documentation

## 2021-01-12 HISTORY — DX: Type 2 diabetes mellitus without complications: E11.9

## 2021-01-12 LAB — TYPE AND SCREEN
ABO/RH(D): O POS
Antibody Screen: NEGATIVE

## 2021-01-12 LAB — COMPREHENSIVE METABOLIC PANEL
ALT: 44 U/L (ref 0–44)
AST: 52 U/L — ABNORMAL HIGH (ref 15–41)
Albumin: 3.5 g/dL (ref 3.5–5.0)
Alkaline Phosphatase: 94 U/L (ref 38–126)
Anion gap: 6 (ref 5–15)
BUN: 22 mg/dL (ref 8–23)
CO2: 27 mmol/L (ref 22–32)
Calcium: 9.6 mg/dL (ref 8.9–10.3)
Chloride: 104 mmol/L (ref 98–111)
Creatinine, Ser: 1.1 mg/dL (ref 0.61–1.24)
GFR, Estimated: >60 mL/min (ref >60)
Glucose, Bld: 154 mg/dL — ABNORMAL HIGH (ref 70–99)
Potassium: 4.3 mmol/L (ref 3.5–5.1)
Sodium: 137 mmol/L (ref 135–145)
Total Bilirubin: 0.9 mg/dL (ref 0.3–1.2)
Total Protein: 7.3 g/dL (ref 6.5–8.1)

## 2021-01-12 LAB — CBC
HCT: 41.9 % (ref 39.0–52.0)
Hemoglobin: 14.8 g/dL (ref 13.0–17.0)
MCH: 34.7 pg — ABNORMAL HIGH (ref 26.0–34.0)
MCHC: 35.3 g/dL (ref 30.0–36.0)
MCV: 98.1 fL (ref 80.0–100.0)
Platelets: 111 10*3/uL — ABNORMAL LOW (ref 150–400)
RBC: 4.27 MIL/uL (ref 4.22–5.81)
RDW: 12 % (ref 11.5–15.5)
WBC: 4 10*3/uL (ref 4.0–10.5)
nRBC: 0 % (ref 0.0–0.2)

## 2021-01-12 LAB — APTT: aPTT: 33 seconds (ref 24–36)

## 2021-01-12 LAB — PROTIME-INR
INR: 1.2 (ref 0.8–1.2)
Prothrombin Time: 14.7 seconds (ref 11.4–15.2)

## 2021-01-12 LAB — GLUCOSE, CAPILLARY: Glucose-Capillary: 173 mg/dL — ABNORMAL HIGH (ref 70–99)

## 2021-01-13 LAB — HEMOGLOBIN A1C
Hgb A1c MFr Bld: 6.6 % — ABNORMAL HIGH (ref 4.8–5.6)
Mean Plasma Glucose: 143 mg/dL

## 2021-01-15 DIAGNOSIS — R7401 Elevation of levels of liver transaminase levels: Secondary | ICD-10-CM | POA: Diagnosis not present

## 2021-01-15 DIAGNOSIS — R945 Abnormal results of liver function studies: Secondary | ICD-10-CM | POA: Diagnosis not present

## 2021-01-15 DIAGNOSIS — B182 Chronic viral hepatitis C: Secondary | ICD-10-CM | POA: Diagnosis not present

## 2021-01-15 DIAGNOSIS — R748 Abnormal levels of other serum enzymes: Secondary | ICD-10-CM | POA: Diagnosis not present

## 2021-01-17 ENCOUNTER — Ambulatory Visit: Payer: BC Managed Care – PPO | Admitting: Gastroenterology

## 2021-01-17 ENCOUNTER — Ambulatory Visit (INDEPENDENT_AMBULATORY_CARE_PROVIDER_SITE_OTHER): Payer: BC Managed Care – PPO | Admitting: Internal Medicine

## 2021-01-17 ENCOUNTER — Encounter: Payer: Self-pay | Admitting: Internal Medicine

## 2021-01-17 VITALS — BP 110/66 | HR 70 | Ht 67.0 in | Wt 141.2 lb

## 2021-01-17 DIAGNOSIS — Z86010 Personal history of colonic polyps: Secondary | ICD-10-CM | POA: Diagnosis not present

## 2021-01-17 DIAGNOSIS — R932 Abnormal findings on diagnostic imaging of liver and biliary tract: Secondary | ICD-10-CM

## 2021-01-17 DIAGNOSIS — K7030 Alcoholic cirrhosis of liver without ascites: Secondary | ICD-10-CM | POA: Diagnosis not present

## 2021-01-17 DIAGNOSIS — F1010 Alcohol abuse, uncomplicated: Secondary | ICD-10-CM

## 2021-01-17 DIAGNOSIS — K222 Esophageal obstruction: Secondary | ICD-10-CM

## 2021-01-17 DIAGNOSIS — C220 Liver cell carcinoma: Secondary | ICD-10-CM | POA: Diagnosis not present

## 2021-01-17 DIAGNOSIS — K2100 Gastro-esophageal reflux disease with esophagitis, without bleeding: Secondary | ICD-10-CM

## 2021-01-17 NOTE — Patient Instructions (Addendum)
If you are age 65 or younger, your body mass index should be between 19-25. Your Body mass index is 22.12 kg/m. If this is out of the aformentioned range listed, please consider follow up with your Primary Care Provider.   __________________________________________________________  The [facility] providers would like to encourage you to use [facility] to communicate with providers for non-urgent requests or questions.  Due to long hold times on the telephone, sending your provider a message by [facility] may be a faster and more efficient way to get a response.  Please allow 48 business hours for a response.  Please remember that this is for non-urgent requests.   It has been recommended to you by your physician that you have an endoscopy/ colonoscopy completed. Per your request, we did not schedule the procedure(s) today. Please contact our office at [phone] should you decide to have the procedure completed. You will be scheduled for a pre-visit and procedure at that time.  Thank you for entrusting me with your care and choosing [facility].  Dr Henrene Pastor

## 2021-01-17 NOTE — Progress Notes (Signed)
HISTORY OF PRESENT ILLNESS:  Levi Conner is a 65 y.o. 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  The patient was seen by [facility] August 16, 2020 most recently November 30, 2020.  I have reviewed those encounters.  Plans were for treatment with Epclusa.  A FibroScan examination revealed F4 hepatic fibrosis.  Abdominal ultrasound  December 12, 2020 revealed heterogeneous mass in the left lobe of the liver measuring 2.6 cm in maximal diameter.  Subsequent MRI of the liver revealed LI RADS category 5 hepatic lesion in segment 2 measuring 2.5 cm.  As well as indeterminate hepatic lesions as described.  Morphologic changes of cirrhosis with borderline splenomegaly noted.  He has been scheduled for ablation therapy with interventional radiology next week.  Patient tells me that he has been abstinent from alcohol for 52 days.  The patient did undergo upper endoscopy with Dr. Carol Ada October 2013 for an acute food impaction.  He also underwent complete colonoscopy with myself June 2013.  He was found to have multiple (6) polyps which were tubular adenomas and sessile serrated polyps.  Follow-up in 3 years recommended.  He did not return for follow-up.  Blood work from March 2022 revealed hemoglobin 16.4.  Platelets 117,000.  Bilirubin is 1.2.  Most recent meld calculated at 44.  Patient tells me that he has been having problems with sinus pain.  He is anticipating sinus surgery.  He does not want to have his endoscopic procedures performed at this time secondary to cost and interest in addressing his issues with his sinus pain.  As well, dealing with his [place] lesion.  REVIEW OF SYSTEMS:  All non-GI ROS negative as otherwise stated in the HPI except for sinus pain, arthritis  Past Medical History:  Diagnosis Date    Arthritis    Borderline diabetic    diet controlled, pt does not check cbg   Colon polyps    Diabetes mellitus without complication (HCC)    no meds   Hemorrhoids    Hepatitis C    tumor on liver treated befor can treat Hep C   Hernia, inguinal, bilateral    current   Hypertension    Melanoma ([place])    Pneumonia 10/23/2011   HOSPITALIZED FOR PNEUMONIA MAY 2013.  PT HAS PULMONARY CLEARANCE FROM DR. WERT FOR HERNIA REPAIR WITH DR. Johney Maine   Rash  since 7-8-201   red rash around bellybutton and belt line and both feet  - resolved    Past Surgical History:  Procedure Laterality Date   ESOPHAGOGASTRODUODENOSCOPY  03/26/2012   Procedure: ESOPHAGOGASTRODUODENOSCOPY (EGD);  Surgeon: Beryle Beams, MD;  [facility];  Service: Endoscopy;  Laterality: N/A;   HERNIA REPAIR  01/30/2012   umb hernia   INGUINAL HERNIA REPAIR  01/30/2012   Procedure: LAPAROSCOPIC BILATERAL INGUINAL HERNIA REPAIR;  Surgeon: Adin Hector, MD;  Location: [facility];  Service: General;  Laterality: Bilateral;   IR RADIOLOGIST EVAL & MGMT  [id number]/[id number]   UMBILICAL HERNIA REPAIR  01/30/2012   Procedure: HERNIA REPAIR UMBILICAL ADULT;  Surgeon: Adin Hector, MD;  Location: [facility];  Service: General;  Laterality: N/A;  Primary Umbilical Hernia Repair    Social History [name]  reports that he has been smoking cigarettes. He has a 15.00 pack-year  smoking history. He has never used smokeless tobacco. He reports previous alcohol use of about 3.0 standard drinks of alcohol per week. He reports that he does not use drugs.  family history is not on file.  Allergies  Allergen Reactions   Penicillins     Has patient had a PCN reaction causing immediate rash, facial/tongue/throat swelling, SOB or lightheadedness with hypotension: Unk Has patient had a PCN reaction causing severe rash involving mucus membranes or skin necrosis: Unk Has patient had a PCN reaction that required hospitalization: Unk Has  patient had a PCN reaction occurring within the last 10 years: NO If all of the above answers are "NO", then may proceed with Cephalosporin use.   Sulfa Antibiotics Other (See Comments)    unknown       PHYSICAL EXAMINATION: Vital signs: BP 110/66   Pulse 70   Ht _0  (1.702 m)   Wt 141 lb 3.2 oz (64 kg)   BMI 22.12 kg/m   Constitutional: Thin, chronically ill-appearing, no acute distress Psychiatric: alert and oriented x3, cooperative.  Somewhat anxious Eyes: extraocular movements intact, anicteric, conjunctiva pink Mouth: oral pharynx moist, no lesions Neck: supple no lymphadenopathy Cardiovascular: heart regular rate and rhythm, no murmur Lungs: clear to auscultation bilaterally Abdomen: soft, nontender, nondistended, no obvious ascites, no peritoneal signs, normal bowel sounds, no organomegaly Rectal: Deferred till colonoscopy Extremities: no clubbing, cyanosis, lower extremity edema bilaterally Skin: no lesions on visible extremities Neuro: No focal deficits. No asterixis.     ASSESSMENT:  1.  Hepatic cirrhosis secondary to hepatitis C and alcohol.  MELD score 11 2.  2.5 cm HCC left liver segment 2.  Anticipating ablation therapy with IR 3.  History of GERD with esophagitis and peptic stricture complicated by food impaction requiring emergent endoscopic intervention 2013 4.  History of multiple adenomatous and sessile serrated polyps.  Overdue for follow-up.  Last examination 2013 5.  History of chronic alcohol abuse.  Abstinent for 52 days 6.  [place].  Anticipating possible treatment with Epclusa 7.  [facility] care with [facility]  PLAN:  1.  Remain abstinent from all alcohol 2.  Schedule colonoscopy for colorectal neoplasia surveillance.  Patient is high risk given his comorbidities.The nature of the procedure, as well as the risks, benefits, and alternatives were carefully and thoroughly reviewed with the patient. Ample time for discussion and questions allowed.  The patient understood, was satisfied, and agreed to proceed.  3.  Schedule upper endoscopy rule out esophageal varices.  The patient is high risk given his comorbidities.The nature of the procedure, as well as the risks, benefits, and alternatives were carefully and thoroughly reviewed with the patient. Ample time for discussion and questions allowed. The patient understood, was satisfied, and agreed to proceed.  4.  Follow-up with IR regarding [place] lesion 5.  Follow-up with [facility] regarding ongoing hepatology management 6.  This patient is not ready to schedule his procedures at this time, I have asked him to contact the office at his nearest convenience when he is ready to do so.  At that time he may be given an appointment with our previous nurse to review his instructions and secure procedure date. A total time of 60 minutes was spent preparing to see the patient, reviewing a myriad of laboratory tests, x-rays, and endoscopy reports.  Reviewing outside records.  Obtaining comprehensive history.  Performing comprehensive physical examination.  Counseling the patient regarding his above multiple listed issues.  Ordering advanced procedures.  And, documenting  clinical information in the health record.

## 2021-01-19 NOTE — Progress Notes (Signed)
Anesthesia Chart Review   Case: [id number] Date/Time: 01/24/21 1145   Procedure: CT WITH ANESTHESIA-THERMAL ABLATION   Anesthesia type: General   Pre-op diagnosis: HEPATIC CELLULAR CARCINOMA   [facility]   Surgeons: Aletta Edouard, MD       DISCUSSION:65 y.o. 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 Aletta Edouard.   Per GI office visit on 01/17/21 pt reported he had been abstinent from alcohol for 52 days. Most recent MELD score 11.   Hepatitis C treatment is on hold until treatment of liver cancer is complete.  VS: BP 129/66   Pulse 68   Temp 36.5 C (Oral)   Resp 16   Ht '5\' 7"'$  (1.702 m)   Wt 64.4 kg   SpO2 99%   BMI 22.24 kg/m   PROVIDERS: Drazek, Dawn, CRNP is PCP    LABS: Labs reviewed: Acceptable for surgery. (all labs ordered are listed, but only abnormal results are displayed)  Labs Reviewed  CBC - Abnormal; Notable for the following components:      Result Value   MCH 34.7 (*)    Platelets 111 (*)    All other components within normal limits  COMPREHENSIVE METABOLIC PANEL - Abnormal; Notable for the following components:   Glucose, Bld 154 (*)    AST 52 (*)    All other components within normal limits  HEMOGLOBIN A1C - Abnormal; Notable for the following components:   Hgb A1c MFr Bld 6.6 (*)    All other components within normal limits  GLUCOSE, CAPILLARY - Abnormal; Notable for the following components:   Glucose-Capillary 173 (*)    All other components within normal limits  PROTIME-INR  APTT  TYPE AND SCREEN     IMAGES:   EKG: 01/12/21 Rate 71 bpm  NSR  CV:  Past Medical History:  Diagnosis Date   Arthritis    Borderline diabetic    diet controlled, pt does not check cbg   Colon polyps    Diabetes mellitus without complication (HCC)    no meds   Hemorrhoids     Hepatitis C    tumor on liver treated befor can treat Hep C   Hernia, inguinal, bilateral    current   Hypertension    Melanoma ([place])    Pneumonia 10/23/2011   HOSPITALIZED FOR PNEUMONIA MAY 2013.  PT HAS PULMONARY CLEARANCE FROM DR. WERT FOR HERNIA REPAIR WITH DR. Johney Maine   Rash  since 7-8-201   red rash around bellybutton and belt line and both feet  - resolved    Past Surgical History:  Procedure Laterality Date   ESOPHAGOGASTRODUODENOSCOPY  03/26/2012   Procedure: ESOPHAGOGASTRODUODENOSCOPY (EGD);  Surgeon: Beryle Beams, MD;  Location: [facility] ENDOSCOPY;  Service: Endoscopy;  Laterality: N/A;   HERNIA REPAIR  01/30/2012   umb hernia   INGUINAL HERNIA REPAIR  01/30/2012   Procedure: LAPAROSCOPIC BILATERAL INGUINAL HERNIA REPAIR;  Surgeon: Adin Hector, MD;  Location[facility];  Service: General;  Laterality: Bilateral;   IR RADIOLOGIST EVAL & MGMT  [id number]   UMBILICAL HERNIA REPAIR  01/30/2012   Procedure: HERNIA REPAIR UMBILICAL ADULT;  Surgeon: Adin Hector, MD;  Location: [facility];  Service: General;  Laterality: N/A;  Primary Umbilical Hernia Repair    MEDICATIONS:  acetaminophen (TYLENOL) 650 MG CR tablet   hydrocortisone 2.5 %  cream   losartan (COZAAR) 50 MG tablet   tetrahydrozoline-zinc (VISINE-AC) 0.05-0.25 % ophthalmic solution   No current facility-administered medications for this encounter.     Levi Felix, PA-C [facility] [phone]

## 2021-01-23 ENCOUNTER — Other Ambulatory Visit: Payer: Self-pay | Admitting: Radiology

## 2021-01-24 ENCOUNTER — Encounter (HOSPITAL_COMMUNITY)
Admission: RE | Disposition: A | Discharge status: Home / Self Care | Payer: Self-pay | Source: Ambulatory Visit | Attending: Interventional Radiology

## 2021-01-24 ENCOUNTER — Observation Stay (HOSPITAL_COMMUNITY)
Admission: RE | Admit: 2021-01-24 | Discharge: 2021-01-25 | Disposition: A | Discharge status: Home / Self Care | Payer: BC Managed Care – PPO | Source: Ambulatory Visit | Attending: Interventional Radiology | Admitting: Interventional Radiology

## 2021-01-24 ENCOUNTER — Ambulatory Visit (HOSPITAL_COMMUNITY): Payer: BC Managed Care – PPO | Admitting: Anesthesiology

## 2021-01-24 ENCOUNTER — Ambulatory Visit (HOSPITAL_COMMUNITY): Payer: BC Managed Care – PPO

## 2021-01-24 ENCOUNTER — Encounter (HOSPITAL_COMMUNITY): Payer: Self-pay | Admitting: Interventional Radiology

## 2021-01-24 ENCOUNTER — Ambulatory Visit (HOSPITAL_COMMUNITY)
Admission: RE | Admit: 2021-01-24 | Discharge: 2021-01-24 | Disposition: A | Discharge status: Home / Self Care | Payer: BC Managed Care – PPO | Source: Ambulatory Visit | Attending: Interventional Radiology | Admitting: Interventional Radiology

## 2021-01-24 ENCOUNTER — Other Ambulatory Visit: Payer: Self-pay

## 2021-01-24 ENCOUNTER — Ambulatory Visit (HOSPITAL_COMMUNITY): Payer: BC Managed Care – PPO | Admitting: Physician Assistant

## 2021-01-24 DIAGNOSIS — C220 Liver cell carcinoma: Secondary | ICD-10-CM | POA: Diagnosis not present

## 2021-01-24 DIAGNOSIS — E119 Type 2 diabetes mellitus without complications: Secondary | ICD-10-CM | POA: Insufficient documentation

## 2021-01-24 DIAGNOSIS — Z20822 Contact with and (suspected) exposure to covid-19: Secondary | ICD-10-CM | POA: Diagnosis not present

## 2021-01-24 DIAGNOSIS — I10 Essential (primary) hypertension: Secondary | ICD-10-CM | POA: Diagnosis not present

## 2021-01-24 DIAGNOSIS — K7030 Alcoholic cirrhosis of liver without ascites: Secondary | ICD-10-CM | POA: Insufficient documentation

## 2021-01-24 DIAGNOSIS — J209 Acute bronchitis, unspecified: Secondary | ICD-10-CM | POA: Diagnosis not present

## 2021-01-24 DIAGNOSIS — Z01818 Encounter for other preprocedural examination: Secondary | ICD-10-CM

## 2021-01-24 DIAGNOSIS — Z880 Allergy status to penicillin: Secondary | ICD-10-CM | POA: Diagnosis not present

## 2021-01-24 DIAGNOSIS — B1920 Unspecified viral hepatitis C without hepatic coma: Secondary | ICD-10-CM | POA: Diagnosis not present

## 2021-01-24 DIAGNOSIS — Z882 Allergy status to sulfonamides status: Secondary | ICD-10-CM | POA: Insufficient documentation

## 2021-01-24 HISTORY — PX: RADIOLOGY WITH ANESTHESIA: SHX6223

## 2021-01-24 LAB — CBC WITH DIFFERENTIAL/PLATELET
Abs Immature Granulocytes: 0.01 10*3/uL (ref 0.00–0.07)
Basophils Absolute: 0.1 10*3/uL (ref 0.0–0.1)
Basophils Relative: 2 %
Eosinophils Absolute: 0.3 10*3/uL (ref 0.0–0.5)
Eosinophils Relative: 6 %
HCT: 42.9 % (ref 39.0–52.0)
Hemoglobin: 15.5 g/dL (ref 13.0–17.0)
Immature Granulocytes: 0 %
Lymphocytes Relative: 41 %
Lymphs Abs: 2.4 10*3/uL (ref 0.7–4.0)
MCH: 34.9 pg — ABNORMAL HIGH (ref 26.0–34.0)
MCHC: 36.1 g/dL — ABNORMAL HIGH (ref 30.0–36.0)
MCV: 96.6 fL (ref 80.0–100.0)
Monocytes Absolute: 0.5 10*3/uL (ref 0.1–1.0)
Monocytes Relative: 9 %
Neutro Abs: 2.5 10*3/uL (ref 1.7–7.7)
Neutrophils Relative %: 42 %
Platelets: 133 10*3/uL — ABNORMAL LOW (ref 150–400)
RBC: 4.44 MIL/uL (ref 4.22–5.81)
RDW: 12.1 % (ref 11.5–15.5)
WBC: 5.9 10*3/uL (ref 4.0–10.5)
nRBC: 0 % (ref 0.0–0.2)

## 2021-01-24 LAB — SARS CORONAVIRUS 2 BY RT PCR (HOSPITAL ORDER, PERFORMED IN ~~LOC~~ HOSPITAL LAB): SARS Coronavirus 2: NEGATIVE

## 2021-01-24 LAB — ABO/RH: ABO/RH(D): O POS

## 2021-01-24 LAB — GLUCOSE, CAPILLARY: Glucose-Capillary: 160 mg/dL — ABNORMAL HIGH (ref 70–99)

## 2021-01-24 IMAGING — CT CT GUIDANCE TISSUE ABLATION
1 of 2 series · 13 of 32 positions shown, 18 images · non-contrast
Comparison: MRI of the abdomen on [DATE] and ultrasound on
[DATE]

CLINICAL DATA: Hepatitis C infection, alcoholic cirrhosis and 2.0 x
2.5 cm left lobe hepatic mass meeting criteria for hepatocellular
carcinoma by imaging (LR-5 by MRI). The patient presents for thermal
ablation of the hepatocellular carcinoma.

EXAM:
CT-GUIDED PERCUTANEOUS THERMAL ABLATION OF HEPATOCELLULAR CARCINOMA

[Series 2: i-spiral 5.0 bf37 · axial · 0.93mm/px · z∈[+1444,+1535]mm · 13 of 31 slices shown, 18 images]
[im 3/31  soft-tissue]
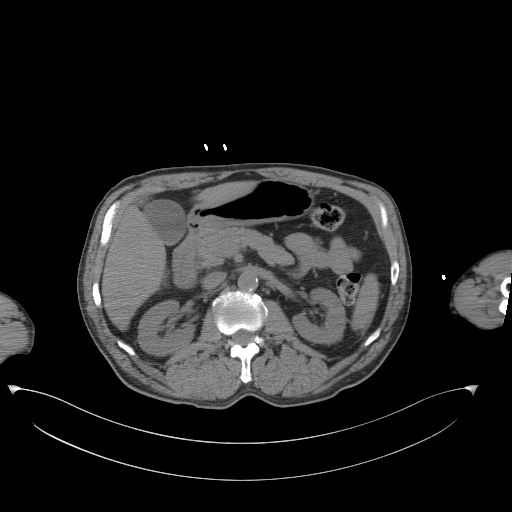
[im 3/31  bone]
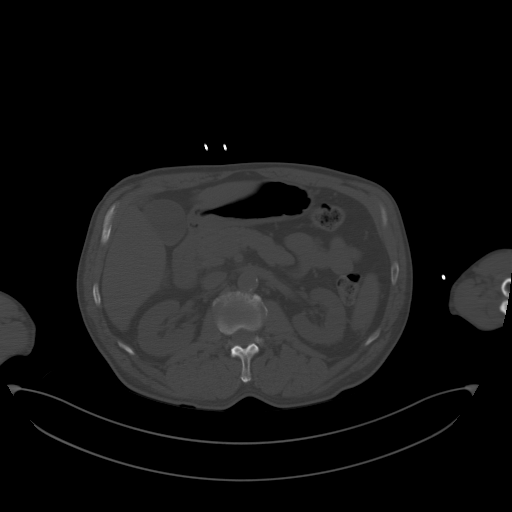
[im 5/31  soft-tissue]
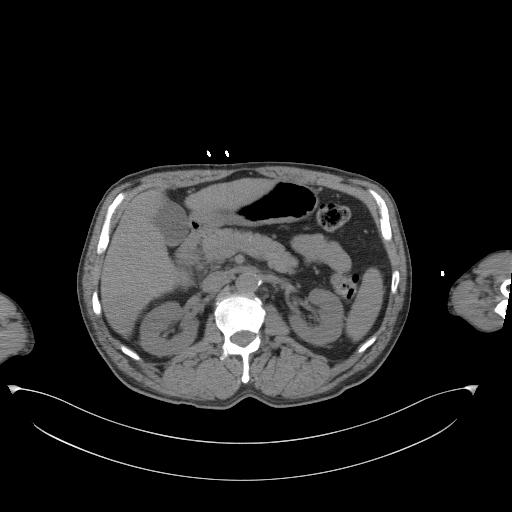
[im 7/31  soft-tissue]
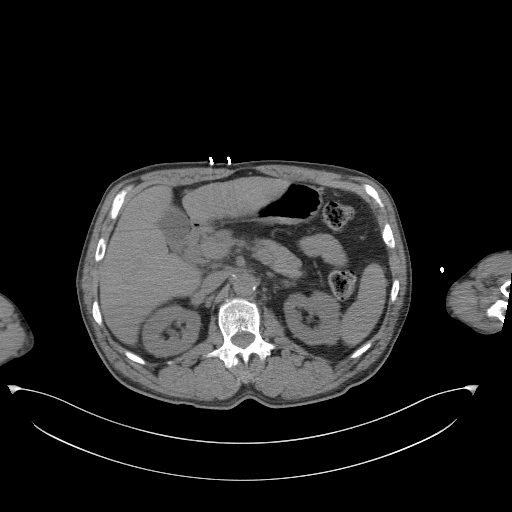
[im 11/31  soft-tissue]
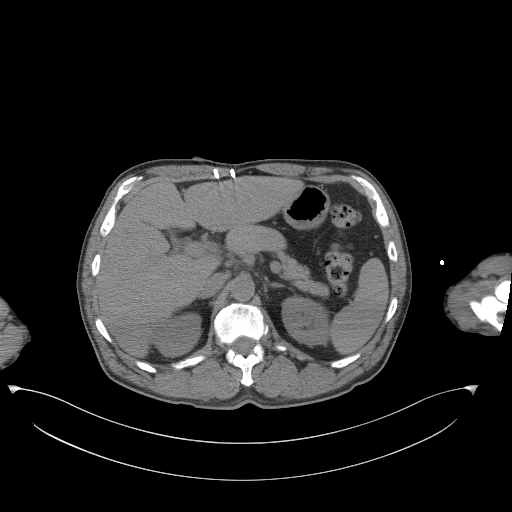
[im 13/31  soft-tissue]
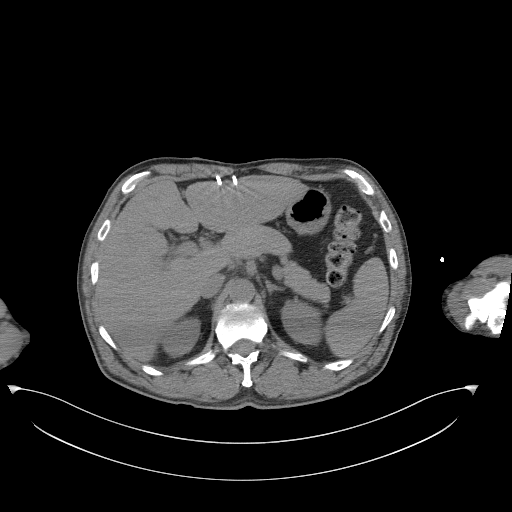
[im 15/31  soft-tissue]
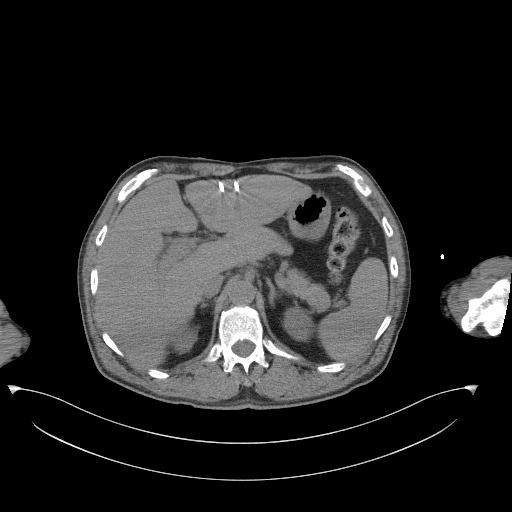
[im 17/31  soft-tissue]
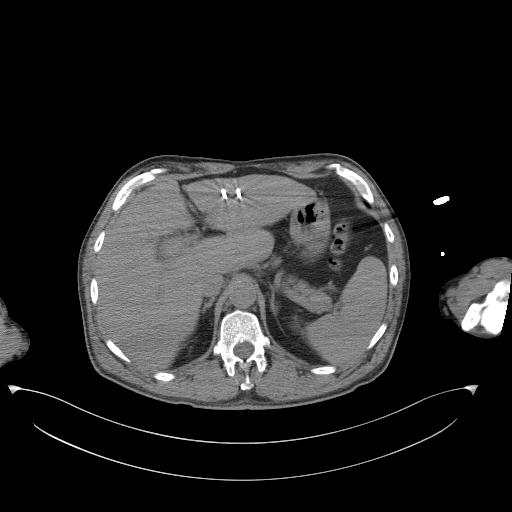
[im 19/31  soft-tissue]
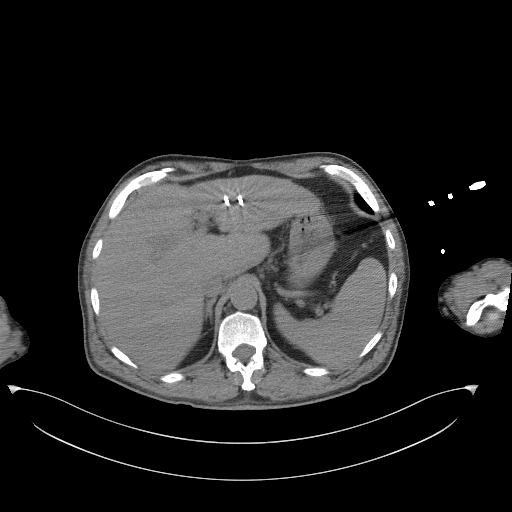
[im 21/31  soft-tissue]
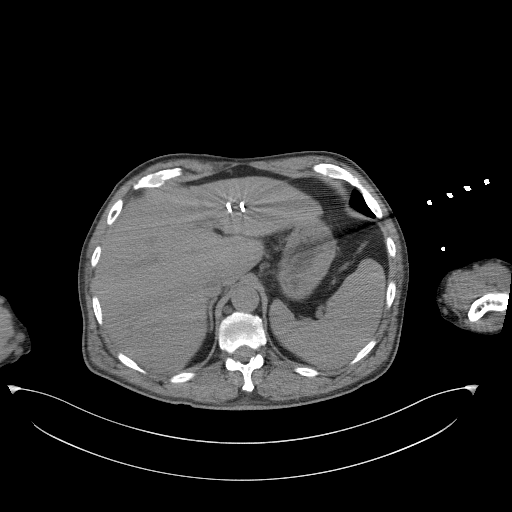
[im 21/31  bone]
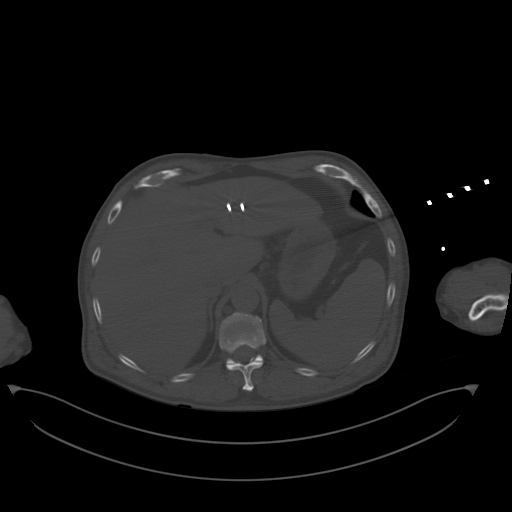
[im 23/31  lung]
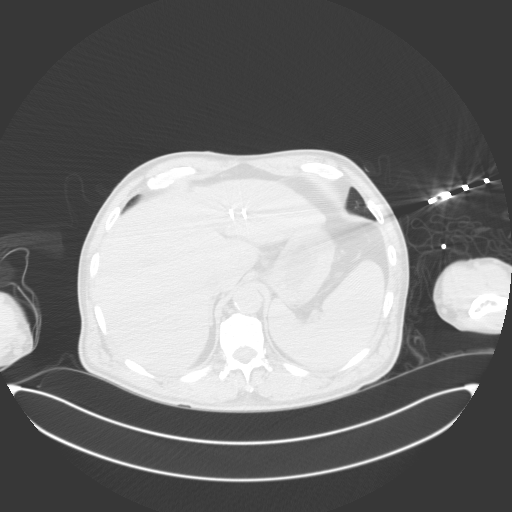
[im 25/31  soft-tissue]
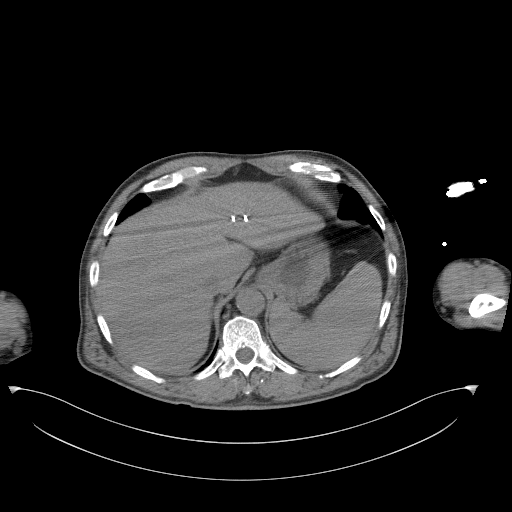
[im 25/31  lung]
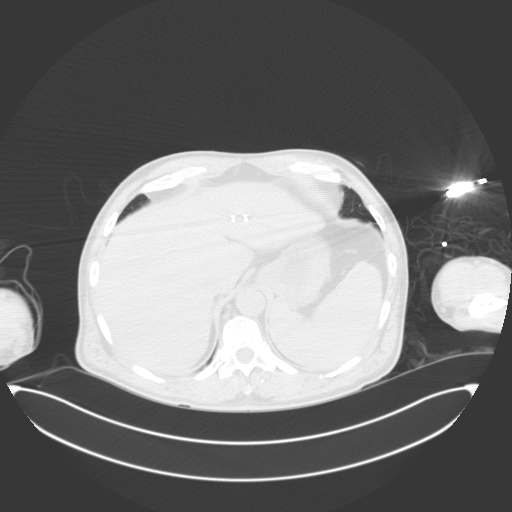
[im 27/31  soft-tissue]
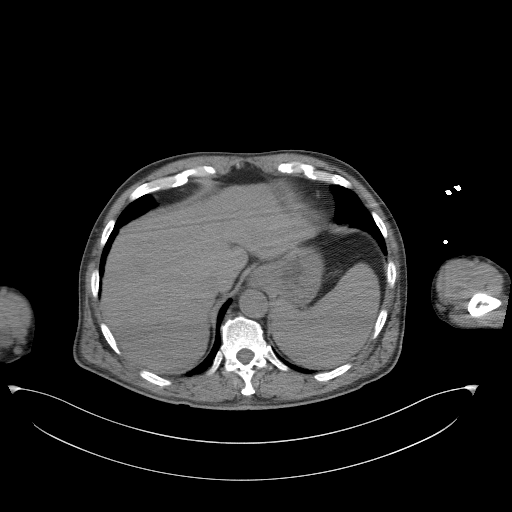
[im 27/31  lung]
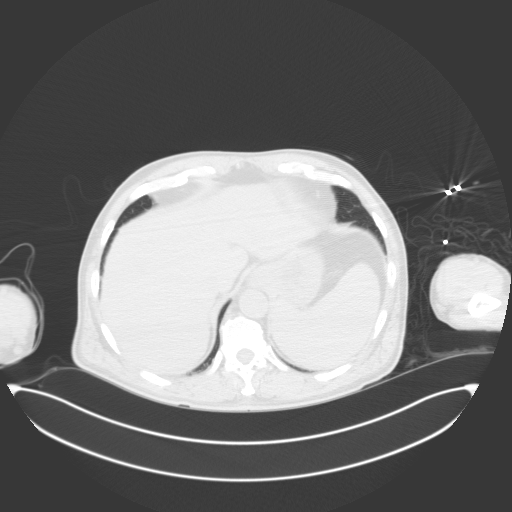
[im 29/31  soft-tissue]
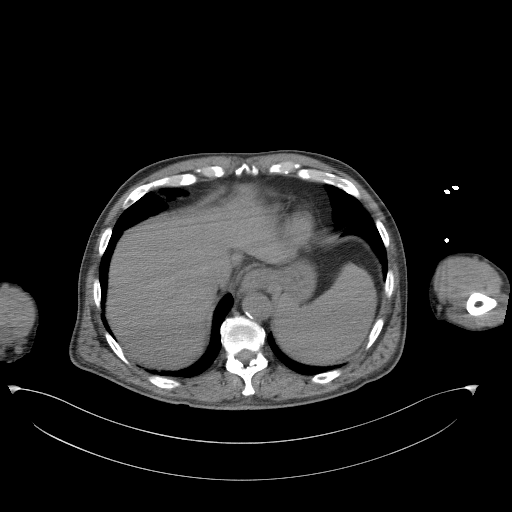
[im 29/31  lung]
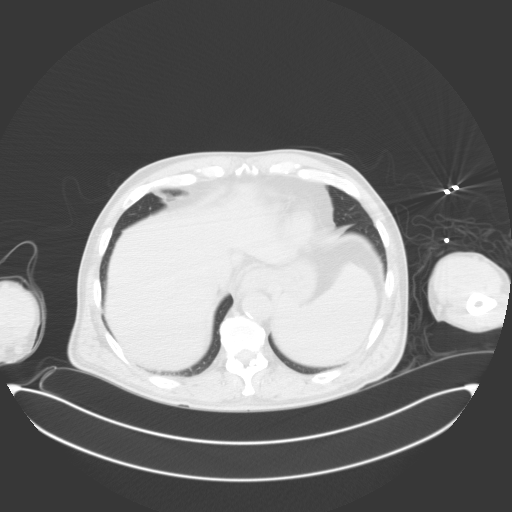

[13 of 32 positions shown; findings below may reference images not displayed]

ANESTHESIA/SEDATION:
Anesthesia:  General

Medications: 900 mg IV clindamycin. The antibiotic was administered
in an appropriate time interval prior to needle puncture of the
skin.

CONTRAST:  None.

PROCEDURE:
The procedure, risks, benefits, and alternatives were explained to
the patient. Questions regarding the procedure were encouraged and
answered. The patient understands and consents to the procedure. A
time-out was performed prior to initiating the procedure.

The patient was placed under general anesthesia. Initial unenhanced
CT was performed in a supine position to localize the liver.
Ultrasound was also used to localize and measure a mass in the left
lobe of the liver.

The epigastric region was prepped with chlorhexidine in a sterile
fashion, and a sterile drape was applied covering the operative
field. A sterile gown and sterile gloves were used for the
procedure.

Under ultrasound guidance, 2 separate NeuWave XT percutaneous
microwave thermal ablation probes were advanced into a mass in the
left lobe of the liver. Probe positioning was confirmed by CT prior
to ablation.

Thermal ablation was performed through the two probes. An 8 minute
cycle of ablation was performed at 60 LORRAINE. Real time ultrasound
monitoring was performed during ablation. Both probe tracts were
then cauterized to the level of the anterior liver capsule.
Post-procedural CT was performed.

COMPLICATIONS:
None
FINDINGS: The mass within segment II of the liver is again visualized by
ultrasound and measures approximately 2.8-3.0 cm in greatest
transverse diameter. Two separate thermal ablation probes were
placed in a parallel fashion within medial and lateral aspects of
the mass with probe positioning confirmed by both ultrasound and CT.
IMPRESSION: CT guided percutaneous thermal ablation of left lobe hepatocellular
carcinoma. The patient will be observed overnight.

## 2021-01-24 IMAGING — CR DG CHEST 1V
1 series · 1 of 1 positions shown · non-contrast
Comparison: [DATE]

CLINICAL DATA: Preop

EXAM:
CHEST  1 VIEW

[w chest pa]
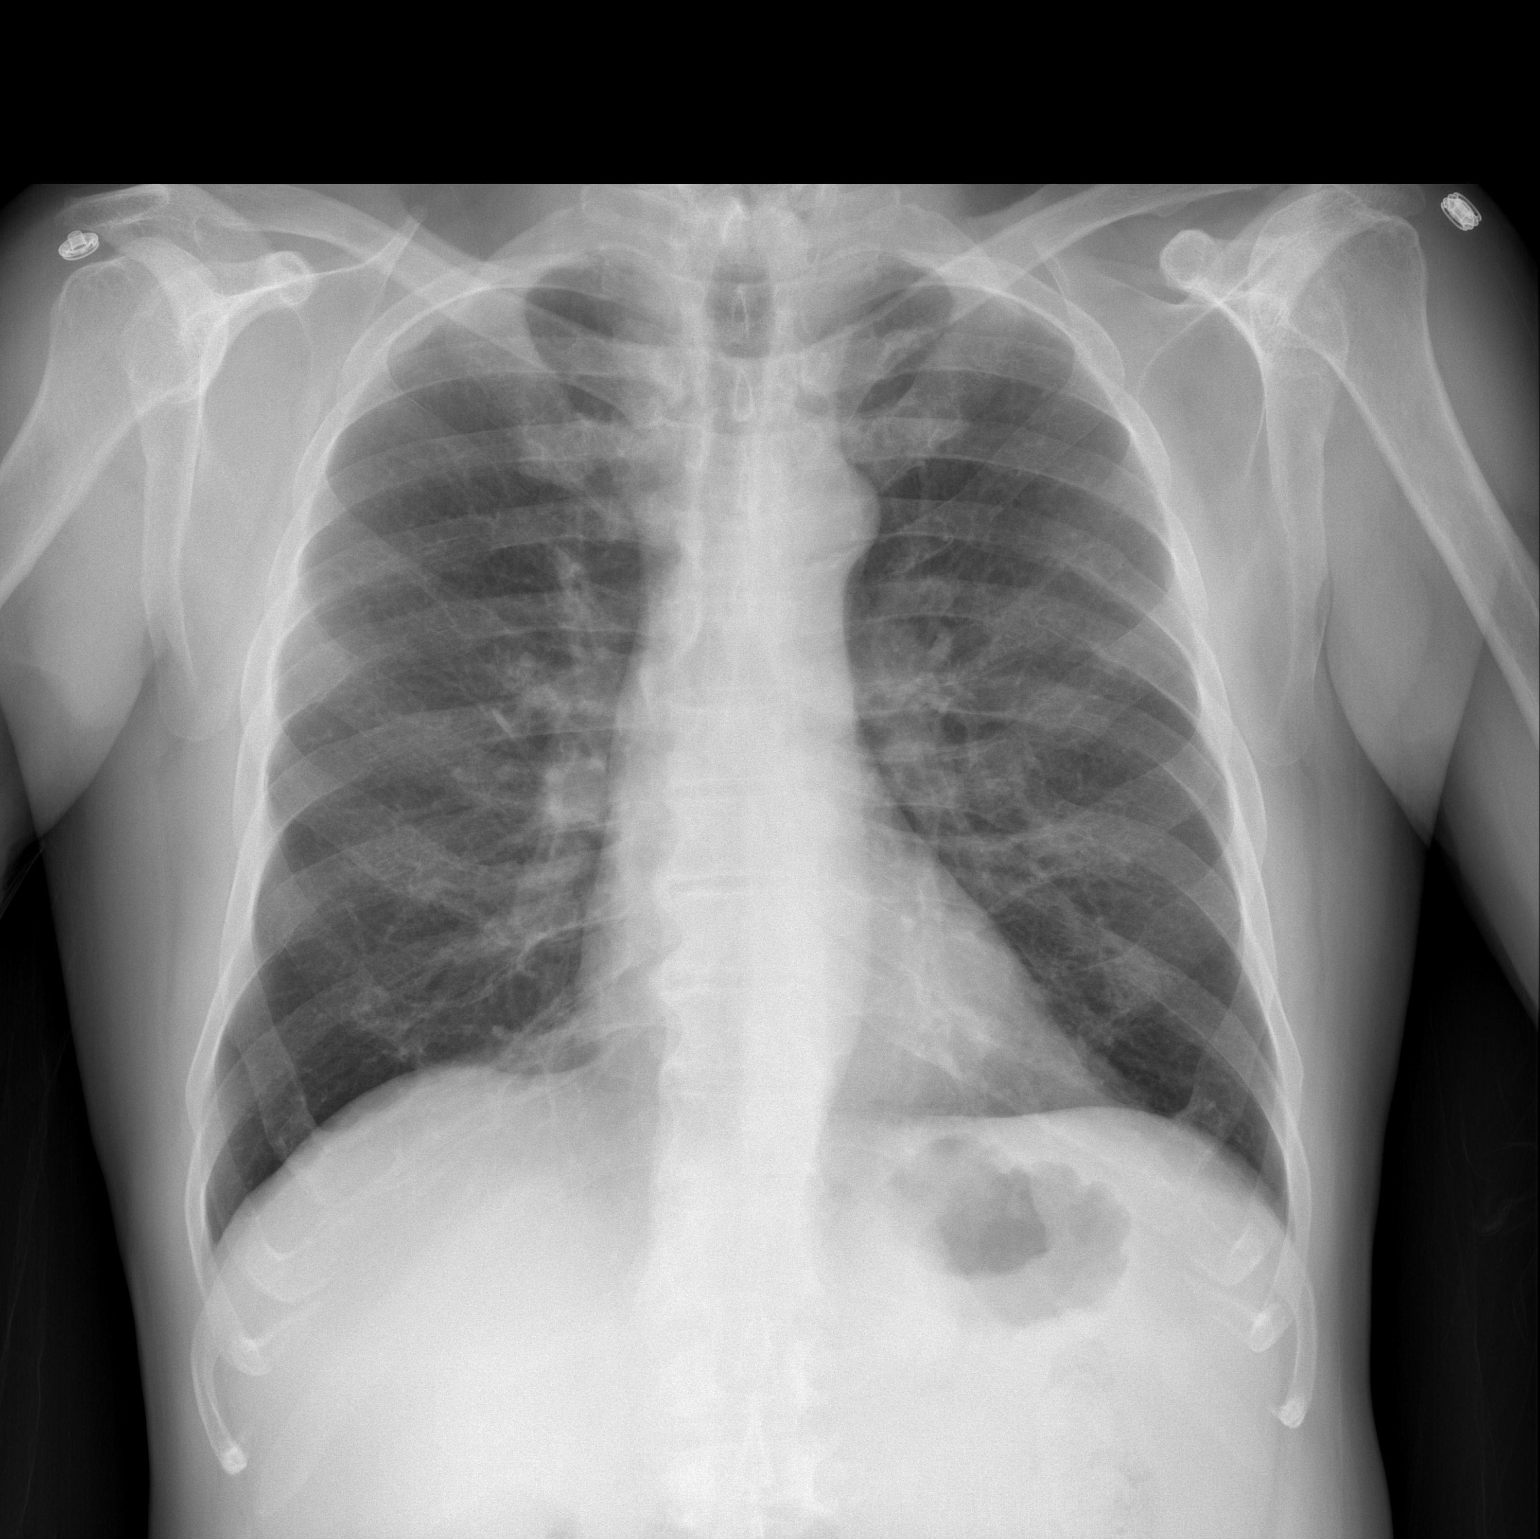

[1 of 1 positions shown; findings below may reference images not displayed]

FINDINGS: The heart size and mediastinal contours are within normal limits.
Both lungs are clear. The visualized skeletal structures are
unremarkable.
IMPRESSION: No active disease.

## 2021-01-24 SURGERY — CT WITH ANESTHESIA
Anesthesia: General

## 2021-01-24 MED ORDER — FENTANYL CITRATE (PF) 100 MCG/2ML IJ SOLN: 25.0000 ug | INTRAMUSCULAR | Status: DC | PRN

## 2021-01-24 MED ORDER — ONDANSETRON HCL 4 MG/2ML IJ SOLN: 4.0000 mg | Freq: Four times a day (QID) | INTRAMUSCULAR | Status: DC | PRN

## 2021-01-24 MED ORDER — PROPOFOL 10 MG/ML IV BOLUS
INTRAVENOUS | Status: DC | PRN
  Administered 2021-01-24: 150 mg via INTRAVENOUS

## 2021-01-24 MED ORDER — MIDAZOLAM HCL 2 MG/2ML IJ SOLN
INTRAMUSCULAR | Status: AC
  Filled 2021-01-24: qty 2

## 2021-01-24 MED ORDER — ROCURONIUM BROMIDE 10 MG/ML (PF) SYRINGE
PREFILLED_SYRINGE | INTRAVENOUS | Status: DC | PRN
  Administered 2021-01-24: 70 mg via INTRAVENOUS

## 2021-01-24 MED ORDER — ORAL CARE MOUTH RINSE: 15.0000 mL | Freq: Once | OROMUCOSAL | Status: AC

## 2021-01-24 MED ORDER — PROMETHAZINE HCL 25 MG/ML IJ SOLN: 6.2500 mg | INTRAMUSCULAR | Status: DC | PRN

## 2021-01-24 MED ORDER — EPHEDRINE SULFATE-NACL 50-0.9 MG/10ML-% IV SOSY
PREFILLED_SYRINGE | INTRAVENOUS | Status: DC | PRN
  Administered 2021-01-24 (×2): 5 mg via INTRAVENOUS

## 2021-01-24 MED ORDER — SODIUM CHLORIDE 0.9 % IV SOLN: INTRAVENOUS | Status: DC

## 2021-01-24 MED ORDER — MIDAZOLAM HCL 2 MG/2ML IJ SOLN
INTRAMUSCULAR | Status: DC | PRN
  Administered 2021-01-24: 2 mg via INTRAVENOUS

## 2021-01-24 MED ORDER — AMISULPRIDE (ANTIEMETIC) 5 MG/2ML IV SOLN: 10.0000 mg | Freq: Once | INTRAVENOUS | Status: DC | PRN

## 2021-01-24 MED ORDER — OXYCODONE HCL 5 MG/5ML PO SOLN: 5.0000 mg | Freq: Once | ORAL | Status: DC | PRN

## 2021-01-24 MED ORDER — DEXAMETHASONE SODIUM PHOSPHATE 10 MG/ML IJ SOLN
INTRAMUSCULAR | Status: DC | PRN
  Administered 2021-01-24: 5 mg via INTRAVENOUS

## 2021-01-24 MED ORDER — LOSARTAN POTASSIUM 50 MG PO TABS
50.0000 mg | ORAL_TABLET | Freq: Every day | ORAL | Status: DC
  Administered 2021-01-25: 50 mg via ORAL
  Filled 2021-01-24: qty 1

## 2021-01-24 MED ORDER — FENTANYL CITRATE (PF) 250 MCG/5ML IJ SOLN
INTRAMUSCULAR | Status: AC
  Filled 2021-01-24: qty 5

## 2021-01-24 MED ORDER — DOCUSATE SODIUM 100 MG PO CAPS
100.0000 mg | ORAL_CAPSULE | Freq: Two times a day (BID) | ORAL | Status: DC
  Administered 2021-01-24: 100 mg via ORAL
  Filled 2021-01-24 (×2): qty 1

## 2021-01-24 MED ORDER — SUGAMMADEX SODIUM 200 MG/2ML IV SOLN
INTRAVENOUS | Status: DC | PRN
  Administered 2021-01-24: 150 mg via INTRAVENOUS

## 2021-01-24 MED ORDER — HYDROCODONE-ACETAMINOPHEN 5-325 MG PO TABS
1.0000 | ORAL_TABLET | ORAL | Status: DC | PRN
  Administered 2021-01-24 – 2021-01-25 (×3): 2 via ORAL
  Filled 2021-01-24 (×3): qty 2

## 2021-01-24 MED ORDER — CLINDAMYCIN PHOSPHATE 900 MG/50ML IV SOLN: 900.0000 mg | Freq: Once | INTRAVENOUS | Status: DC

## 2021-01-24 MED ORDER — PHENYLEPHRINE 40 MCG/ML (10ML) SYRINGE FOR IV PUSH (FOR BLOOD PRESSURE SUPPORT)
PREFILLED_SYRINGE | INTRAVENOUS | Status: DC | PRN
  Administered 2021-01-24 (×5): 80 ug via INTRAVENOUS

## 2021-01-24 MED ORDER — OXYCODONE HCL 5 MG PO TABS: 5.0000 mg | ORAL_TABLET | Freq: Once | ORAL | Status: DC | PRN

## 2021-01-24 MED ORDER — LIDOCAINE 2% (20 MG/ML) 5 ML SYRINGE
INTRAMUSCULAR | Status: DC | PRN
  Administered 2021-01-24: 80 mg via INTRAVENOUS

## 2021-01-24 MED ORDER — LACTATED RINGERS IV SOLN: INTRAVENOUS | Status: DC

## 2021-01-24 MED ORDER — CLINDAMYCIN PHOSPHATE 900 MG/50ML IV SOLN
900.0000 mg | INTRAVENOUS | Status: AC
  Administered 2021-01-24: 900 mg via INTRAVENOUS
  Filled 2021-01-24: qty 50

## 2021-01-24 MED ORDER — SENNOSIDES-DOCUSATE SODIUM 8.6-50 MG PO TABS: 1.0000 | ORAL_TABLET | Freq: Every day | ORAL | Status: DC | PRN

## 2021-01-24 MED ORDER — FENTANYL CITRATE (PF) 250 MCG/5ML IJ SOLN
INTRAMUSCULAR | Status: DC | PRN
  Administered 2021-01-24 (×2): 50 ug via INTRAVENOUS
  Administered 2021-01-24: 100 ug via INTRAVENOUS
  Administered 2021-01-24: 50 ug via INTRAVENOUS

## 2021-01-24 MED ORDER — ONDANSETRON HCL 4 MG/2ML IJ SOLN
INTRAMUSCULAR | Status: DC | PRN
  Administered 2021-01-24: 4 mg via INTRAVENOUS

## 2021-01-24 MED ORDER — HYDROMORPHONE HCL 1 MG/ML IJ SOLN
0.5000 mg | INTRAMUSCULAR | Status: DC | PRN
  Administered 2021-01-24 – 2021-01-25 (×3): 0.5 mg via INTRAVENOUS
  Filled 2021-01-24 (×3): qty 0.5

## 2021-01-24 MED ORDER — CHLORHEXIDINE GLUCONATE 0.12 % MT SOLN
15.0000 mL | Freq: Once | OROMUCOSAL | Status: AC
  Administered 2021-01-24: 15 mL via OROMUCOSAL

## 2021-01-24 MED ORDER — NAPHAZOLINE-GLYCERIN 0.012-0.25 % OP SOLN: 2.0000 [drp] | Freq: Four times a day (QID) | OPHTHALMIC | Status: DC | PRN

## 2021-01-24 NOTE — H&P (Addendum)
Referring Physician(s): Drazek,D  Supervising Physician: Aletta Edouard  Patient Status:  [facility] TBA  Chief Complaint: Left lobe liver lesion consistent with hepatocellular carcinoma by imaging   Subjective: Patient familiar to IR service from recent consultation with Dr. Kathlene Cote on 12/26/2020 to discuss treatment options for a 2.6 cm left lobe liver mass consistent with hepatocellular carcinoma.  He has a past medical history significant for alcoholic cirrhosis, tobacco abuse, hepatitis C, GERD with prior esophagitis and peptic stricture, colon polyps, borderline diabetes, hypertension, melanoma, and arthritis.  Following discussions with Dr. Kathlene Cote patient was deemed an appropriate candidate for image guided percutaneous thermal ablation/microwave ablation of the left lobe [facility] and presents today for the procedure.  Patient currently denies fever, chest pain, worsening dyspnea, abdominal/back pain, nausea, vomiting or bleeding.   He does have a mild headache and occasional cough.  He continues to smoke but denies alcohol use at this time.   Past Medical History:  Diagnosis Date   Arthritis    Borderline diabetic    diet controlled, pt does not check cbg   Colon polyps    Diabetes mellitus without complication (HCC)    no meds   Hemorrhoids    Hepatitis C    tumor on liver treated befor can treat Hep C   Hernia, inguinal, bilateral    current   Hypertension    Melanoma ([place])    Pneumonia 10/23/2011   HOSPITALIZED FOR PNEUMONIA MAY 2013.  PT HAS PULMONARY CLEARANCE FROM DR. WERT FOR HERNIA REPAIR WITH DR. Johney Maine   Rash  since 7-8-201   red rash around bellybutton and belt line and both feet  - resolved   Past Surgical History:  Procedure Laterality Date   ESOPHAGOGASTRODUODENOSCOPY  03/26/2012   Procedure: ESOPHAGOGASTRODUODENOSCOPY (EGD);  Surgeon: Beryle Beams, MD;  Location: [facility] ENDOSCOPY;  Service: Endoscopy;  Laterality: N/A;   HERNIA REPAIR  01/30/2012   umb hernia    INGUINAL HERNIA REPAIR  01/30/2012   Procedure: LAPAROSCOPIC BILATERAL INGUINAL HERNIA REPAIR;  Surgeon: Adin Hector, MD;  Location[facility];  Service: General;  Laterality: Bilateral;   IR RADIOLOGIST EVAL & MGMT  [id number]   UMBILICAL HERNIA REPAIR  01/30/2012   Procedure: HERNIA REPAIR UMBILICAL ADULT;  Surgeon: Adin Hector, MD;  Location[facility];  Service: General;  Laterality: N/A;  Primary Umbilical Hernia Repair      Allergies: Penicillins and Sulfa antibiotics  Medications: Prior to Admission medications   Medication Sig Start Date End Date Taking? Authorizing Provider  acetaminophen (TYLENOL) 650 MG CR tablet Take 650 mg by mouth every 8 (eight) hours as needed for pain.   Yes [provider]  hydrocortisone 2.5 % cream Apply 1 application topically daily. 04/05/17  Yes [provider]  losartan (COZAAR) 50 MG tablet Take 50 mg by mouth daily.   Yes [provider]  tetrahydrozoline-zinc (VISINE-AC) 0.05-0.25 % ophthalmic solution Place 2 drops into both eyes daily as needed (dry eyes).   Yes [provider]     Vital Signs: Blood pressure 119/76, temperature 97.8, heart rate 64, respirations 15, O2 sat 99% room air     Physical Exam awake, alert.  Chest with distant but clear breath sounds bilaterally.  Heart with regular rate and rhythm.  Abdomen soft, positive bowel sounds, nontender.  No lower extremity edema.  Imaging: No results found.  Labs:  CBC: Recent Labs    08/22/20 2023 08/22/20 2029 01/12/21 1441  WBC 5.8  --  4.0  HGB 16.4 16.3 14.8  HCT 45.2 48.0 41.9  PLT 117*  --  111*    COAGS: Recent Labs    08/22/20 2023 01/12/21 1441  INR 1.1 1.2  APTT 31 33    BMP: Recent Labs    08/22/20 2023 08/22/20 2029 01/12/21 1441  NA 132* 132* 137  K 3.9 3.9 4.3  CL 100 102 104  CO2 20*  --  27  GLUCOSE 138* 135* 154*  BUN '18 21 22  '$ CALCIUM 9.8  --  9.6  CREATININE 1.30* 1.40* 1.10  GFRNONAA >60   --  >60    LIVER FUNCTION TESTS: Recent Labs    08/22/20 2023 01/12/21 1441  BILITOT 1.2 0.9  AST 116* 52*  ALT 101* 44  ALKPHOS 95 94  PROT 7.5 7.3  ALBUMIN 3.5 3.5    Assessment and Plan: Patient familiar to IR service from recent consultation with Dr. Kathlene Cote on 12/26/2020 to discuss treatment options for a 2.6 cm left lobe liver mass consistent with hepatocellular carcinoma.  He has a past medical history significant for alcoholic cirrhosis, tobacco abuse, hepatitis C, GERD with prior esophagitis and peptic stricture, colon polyps, borderline diabetes, hypertension, melanoma, and arthritis.  Following discussions with Dr. Kathlene Cote patient was deemed an appropriate candidate for image guided percutaneous thermal ablation/microwave ablation of the left lobe [facility] and presents today for the procedure.  Details/risks of procedure, including but not limited to, internal bleeding, infection, injury to adjacent structures, anesthesia related complications discussed with patient with his understanding and consent. Postprocedure he will be admitted to the hospital  for overnight observation.   Electronically Signed: D. Rowe Robert, PA-C 01/24/2021, 10:18 AM   I spent a total of 30 minutes at the the patient's bedside AND on the patient's hospital floor or unit, greater than 50% of which was counseling/coordinating care for image guided microwave ablation of left lobe liver hepatocellular carcinoma

## 2021-01-24 NOTE — Sedation Documentation (Signed)
Anesthesia in to sedate and monitor. 

## 2021-01-24 NOTE — Progress Notes (Signed)
Patient ID: RAYLEN LEDDON, male   DOB: 1955-07-25, 65 y.o.   MRN: [id number] Patient status post microwave ablation of left lobe hepatocellular carcinoma earlier this afternoon; c/o some mild-mod epigastric discomfort; no nausea /vomiting or respiratory difficulties; BP 166/90, heart rate 79, respiration 17, O2 sat 94% RA; puncture site mid abdomen clean and dry, mildly tender to palpation; for overnight observation; add prn low dose Dilaudid for pain control ;check a.m. labs; follow-up with Dr. Kathlene Cote in [facility] in 3 to 4 weeks.

## 2021-01-24 NOTE — Transfer of Care (Signed)
Immediate Anesthesia Transfer of Care Note  Patient: Levi Conner  Procedure(s) Performed: CT WITH ANESTHESIA-THERMAL ABLATION  Patient Location: PACU  Anesthesia Type:General  Level of Consciousness: awake, alert  and oriented  Airway & Oxygen Therapy: Patient Spontanous Breathing and Patient connected to face mask oxygen  Post-op Assessment: Report given to RN and Post -op Vital signs reviewed and stable  Post vital signs: Reviewed and stable  Last Vitals:  Vitals Value Taken Time  BP 166/86 01/24/21 1504  Temp    Pulse 88 01/24/21 1506  Resp 20 01/24/21 1506  SpO2 100 % 01/24/21 1506  Vitals shown include unvalidated device data.  Last Pain:  Vitals:   01/24/21 1044  PainSc: 0-No pain         Complications: No notable events documented.

## 2021-01-24 NOTE — Anesthesia Postprocedure Evaluation (Signed)
Anesthesia Post Note  Patient: Levi Conner  Procedure(s) Performed: CT WITH ANESTHESIA-THERMAL ABLATION     Patient location during evaluation: PACU Anesthesia Type: General Level of consciousness: awake and alert Pain management: pain level controlled Vital Signs Assessment: post-procedure vital signs reviewed and stable Respiratory status: spontaneous breathing, nonlabored ventilation, respiratory function stable and patient connected to nasal cannula oxygen Cardiovascular status: blood pressure returned to baseline and stable Postop Assessment: no apparent nausea or vomiting Anesthetic complications: no   No notable events documented.  Last Vitals: There were no vitals filed for this visit.  Last Pain:  Vitals:   01/24/21 1044  PainSc: 0-No pain                 Bernardine Langworthy

## 2021-01-24 NOTE — Anesthesia Procedure Notes (Signed)
Procedure Name: Intubation Date/Time: 01/24/2021 1:13 PM Performed by: Lollie Sails, CRNA Pre-anesthesia Checklist: Patient identified, Emergency Drugs available, Suction available, Patient being monitored and Timeout performed Patient Re-evaluated:Patient Re-evaluated prior to induction Oxygen Delivery Method: Circle system utilized Preoxygenation: Pre-oxygenation with 100% oxygen Induction Type: IV induction Ventilation: Mask ventilation without difficulty Laryngoscope Size: Miller and 3 Grade View: Grade I Tube type: Oral Tube size: 7.5 mm Number of attempts: 1 Airway Equipment and Method: Stylet Placement Confirmation: ETT inserted through vocal cords under direct vision, positive ETCO2 and breath sounds checked- equal and bilateral Secured at: 23 cm Tube secured with: Tape Dental Injury: Teeth and Oropharynx as per pre-operative assessment

## 2021-01-24 NOTE — Procedures (Signed)
Interventional Radiology Procedure Note  Procedure: Thermal ablation of left lobe hepatocellular carcinoma  Anesthesia: General  Complications: None  Estimated Blood Loss: < 10 mL  Findings: Two NeuWave XT microwave ablation probes advanced into left lobe tumor under US guidance. Probe placement confirmed by CT. Microwave thermal ablation performed at 69 W over 8 minutes. Probe tracts cauterized.  Plan: PACU recovery followed by overnight observation.  Venetia Night. Kathlene Cote, M.D Pager:  [phone]

## 2021-01-24 NOTE — Anesthesia Preprocedure Evaluation (Addendum)
Anesthesia Evaluation  Patient identified by MRN, date of birth, ID band Patient awake    Reviewed: Allergy & Precautions, NPO status , Patient's Chart, lab work & pertinent test results  Airway Mallampati: II  TM Distance: >3 FB Neck ROM: Full    Dental  (+) Edentulous Upper, Poor Dentition Several missing and decayed teeth:   Pulmonary Current Smoker,    Pulmonary exam normal breath sounds clear to auscultation       Cardiovascular hypertension, negative cardio ROS Normal cardiovascular exam Rhythm:Regular Rate:Normal     Neuro/Psych negative neurological ROS  negative psych ROS   GI/Hepatic negative GI ROS, (+)     Substance abuse: h/o of alcohol abuse.  , Hepatitis -Liver lesion   Endo/Other  negative endocrine ROSdiabetes, Type 2  Renal/GU negative Renal ROS  negative genitourinary   Musculoskeletal  (+) Arthritis , Osteoarthritis,    Abdominal   Peds negative pediatric ROS (+)  Hematology negative hematology ROS (+)   Anesthesia Other Findings   Reproductive/Obstetrics negative OB ROS                            Anesthesia Physical Anesthesia Plan  ASA: 2  Anesthesia Plan: General   Post-op Pain Management:    Induction: Intravenous  PONV Risk Score and Plan: Treatment may vary due to age or medical condition and Ondansetron  Airway Management Planned: Oral ETT  Additional Equipment:   Intra-op Plan:   Post-operative Plan: Extubation in OR  Informed Consent: I have reviewed the patients History and Physical, chart, labs and discussed the procedure including the risks, benefits and alternatives for the proposed anesthesia with the patient or authorized representative who has indicated his/her understanding and acceptance.     Dental advisory given  Plan Discussed with: CRNA, Anesthesiologist and Surgeon  Anesthesia Plan Comments:        Anesthesia Quick  Evaluation

## 2021-01-25 ENCOUNTER — Encounter (HOSPITAL_COMMUNITY): Payer: Self-pay | Admitting: Interventional Radiology

## 2021-01-25 ENCOUNTER — Other Ambulatory Visit: Payer: Self-pay | Admitting: Radiology

## 2021-01-25 DIAGNOSIS — B1920 Unspecified viral hepatitis C without hepatic coma: Secondary | ICD-10-CM | POA: Diagnosis not present

## 2021-01-25 DIAGNOSIS — C220 Liver cell carcinoma: Secondary | ICD-10-CM | POA: Diagnosis not present

## 2021-01-25 DIAGNOSIS — K7030 Alcoholic cirrhosis of liver without ascites: Secondary | ICD-10-CM | POA: Diagnosis not present

## 2021-01-25 DIAGNOSIS — Z880 Allergy status to penicillin: Secondary | ICD-10-CM | POA: Diagnosis not present

## 2021-01-25 DIAGNOSIS — Z20822 Contact with and (suspected) exposure to covid-19: Secondary | ICD-10-CM | POA: Diagnosis not present

## 2021-01-25 DIAGNOSIS — I10 Essential (primary) hypertension: Secondary | ICD-10-CM | POA: Diagnosis not present

## 2021-01-25 DIAGNOSIS — Z882 Allergy status to sulfonamides status: Secondary | ICD-10-CM | POA: Diagnosis not present

## 2021-01-25 DIAGNOSIS — E119 Type 2 diabetes mellitus without complications: Secondary | ICD-10-CM | POA: Diagnosis not present

## 2021-01-25 LAB — BASIC METABOLIC PANEL
Anion gap: 9 (ref 5–15)
BUN: 22 mg/dL (ref 8–23)
CO2: 21 mmol/L — ABNORMAL LOW (ref 22–32)
Calcium: 9.1 mg/dL (ref 8.9–10.3)
Chloride: 101 mmol/L (ref 98–111)
Creatinine, Ser: 1.15 mg/dL (ref 0.61–1.24)
GFR, Estimated: >60 mL/min (ref >60)
Glucose, Bld: 279 mg/dL — ABNORMAL HIGH (ref 70–99)
Potassium: 4.2 mmol/L (ref 3.5–5.1)
Sodium: 131 mmol/L — ABNORMAL LOW (ref 135–145)

## 2021-01-25 LAB — CBC
HCT: 40.6 % (ref 39.0–52.0)
Hemoglobin: 14.4 g/dL (ref 13.0–17.0)
MCH: 34.6 pg — ABNORMAL HIGH (ref 26.0–34.0)
MCHC: 35.5 g/dL (ref 30.0–36.0)
MCV: 97.6 fL (ref 80.0–100.0)
Platelets: 117 10*3/uL — ABNORMAL LOW (ref 150–400)
RBC: 4.16 MIL/uL — ABNORMAL LOW (ref 4.22–5.81)
RDW: 12.2 % (ref 11.5–15.5)
WBC: 8.3 10*3/uL (ref 4.0–10.5)
nRBC: 0 % (ref 0.0–0.2)

## 2021-01-25 MED ORDER — HYDROCODONE-ACETAMINOPHEN 5-325 MG PO TABS: 1.0000 | ORAL_TABLET | ORAL | 0 refills | Status: AC | PRN

## 2021-01-25 NOTE — Discharge Summary (Signed)
Physician Discharge Summary      Patient ID: Levi Conner MRN: [id number] DOB/AGE: 65/16/1957 65 y.o.  Admit date: 01/24/2021 Discharge date: 01/25/2021  Admission Diagnoses: Active Problems:   Hepatocellular carcinoma [facility])  Discharge Diagnoses:  Active Problems:   Hepatocellular carcinoma (Levi Conner)    Procedures: Procedure(s): CT WITH ANESTHESIA-THERMAL ABLATION OF LEFT LOBE HEPATIC LESION  Discharged Condition: good  Hospital Course: Admitted 8/3 after successful CT guided RF ablation of left lobe hepatic lesion. No immediate complications. Tolerated diet. Foley removed, has voided on own. Pain controlled well. Stable for discharge. All medications, instructions, and follow up reviewed.   Consults: None   Discharge Exam: Blood pressure 125/75, pulse 86, temperature 97.6 F (36.4 C), temperature source Oral, resp. rate 14, height '5\' 7"'$  (1.702 m), weight 64 kg, SpO2 99 %. General: NAD Lungs: CTA without w/r/r Heart: Regular Abdomen: soft, NT. Puncture site clean, dry, no hematoma    Disposition: Discharge disposition: 01-Home or Self Care       Discharge Instructions     Call MD for:  difficulty breathing, headache or visual disturbances   Complete by: As directed    Call MD for:  persistant nausea and vomiting   Complete by: As directed    Call MD for:  redness, tenderness, or signs of infection (pain, swelling, redness, odor or green/yellow discharge around incision site)   Complete by: As directed    Call MD for:  severe uncontrolled pain   Complete by: As directed    Call MD for:  temperature >100.4   Complete by: As directed    Diet - low sodium heart healthy   Complete by: As directed    Increase activity slowly   Complete by: As directed    May shower / Bathe   Complete by: As directed    Remove dressing in 24 hours   Complete by: As directed       Allergies as of 01/25/2021       Reactions   Penicillins    Has patient had a PCN  reaction causing immediate rash, facial/tongue/throat swelling, SOB or lightheadedness with hypotension: Unk Has patient had a PCN reaction causing severe rash involving mucus membranes or skin necrosis: Unk Has patient had a PCN reaction that required hospitalization: Unk Has patient had a PCN reaction occurring within the last 10 years: NO If all of the above answers are "NO", then may proceed with Cephalosporin use.   Sulfa Antibiotics Other (See Comments)   unknown        Medication List     TAKE these medications    acetaminophen 650 MG CR tablet Commonly known as: TYLENOL Take 650 mg by mouth every 8 (eight) hours as needed for pain.   HYDROcodone-acetaminophen 5-325 MG tablet Commonly known as: NORCO/VICODIN Take 1-2 tablets by mouth every 4 (four) hours as needed for up to 5 days for moderate pain.   hydrocortisone 2.5 % cream Apply 1 application topically daily.   losartan 50 MG tablet Commonly known as: COZAAR Take 50 mg by mouth daily.   tetrahydrozoline-zinc 0.05-0.25 % ophthalmic solution Commonly known as: VISINE-AC Place 2 drops into both eyes daily as needed (dry eyes).        Follow-up Information     Aletta Edouard, MD. Go in 3 week(s).   Specialties: Interventional Radiology, Radiology Why: Clinic will call you to schedule follow up Contact information: [address] [place] [address] [phone]  SignedAscencion Dike PA-C 01/25/2021, 10:00 AM

## 2021-02-06 ENCOUNTER — Telehealth: Payer: Self-pay | Admitting: Student

## 2021-02-06 NOTE — Telephone Encounter (Signed)
IR received a message to call the patient regarding his procedure skin site. Patient reports increased pain/redness/drainage around the site. The patient was called on his cell phone but there was no answer. A VM message was left requesting the patient to call the clinic. The patient has a follow up appointment scheduled with Dr. Kathlene Cote next week.  Soyla Dryer, [place] [phone] 02/06/2021, 2:16 PM

## 2021-02-06 NOTE — Telephone Encounter (Signed)
I was able to speak with the patient regarding his incision. He states the incision is a little red but is not painful or warm and there's just a slight amount of drainage from the site. He denies fever, chills, nausea/vomiting but states he has a little diarrhea. He is eating and drinking well and otherwise without complaint. He states the incision has scabbed over and he's been cleaning it with hydrogen peroxide and putting neosporin on it. I encouraged him to just use a gentle cleaners over the site while in the shower and to keep the site clean and dry.   He knows to call the clinic if he develops any signs/symptoms of a skin or systemic infection. He will follow up with Dr. Kathlene Cote next week with a tele-visit.   Soyla Dryer, [place] [phone] 02/06/2021, 4:22 PM

## 2021-02-13 ENCOUNTER — Encounter: Payer: Self-pay | Admitting: *Deleted

## 2021-02-13 ENCOUNTER — Ambulatory Visit
Admission: RE | Admit: 2021-02-13 | Discharge: 2021-02-13 | Disposition: A | Discharge status: Home / Self Care | Payer: BC Managed Care – PPO | Source: Ambulatory Visit | Attending: Radiology | Admitting: Radiology

## 2021-02-13 ENCOUNTER — Other Ambulatory Visit: Payer: Self-pay

## 2021-02-13 DIAGNOSIS — C220 Liver cell carcinoma: Secondary | ICD-10-CM | POA: Diagnosis not present

## 2021-02-13 HISTORY — PX: IR RADIOLOGIST EVAL & MGMT: IMG5224

## 2021-03-14 NOTE — Progress Notes (Signed)
Chief Complaint: Patient was consulted remotely today (TeleHealth) for follow up after liver ablation.  History of Present Illness: Levi Conner is a 65 y.o. male status post percutaneous microwave thermal ablation of a 3 cm segment II hepatocellular carcinoma within the left lobe of the liver on 01/24/2021.  He did well following the procedure but called approximately 2 weeks following the procedure with complaint of some pain, erythema and drainage around the skin site at the level of ablation.  This did resolve.  He did have some lower abdominal pain following the procedure and he felt that it took him a while to recover from anesthesia.  He currently denies any significant symptoms.  Past Medical History:  Diagnosis Date   Arthritis    Borderline diabetic    diet controlled, pt does not check cbg   Colon polyps    Diabetes mellitus without complication (HCC)    no meds   Hemorrhoids    Hepatitis C    tumor on liver treated befor can treat Hep C   Hernia, inguinal, bilateral    current   Hypertension    Melanoma ([place])    Pneumonia 10/23/2011   HOSPITALIZED FOR PNEUMONIA MAY 2013.  PT HAS PULMONARY CLEARANCE FROM DR. WERT FOR HERNIA REPAIR WITH DR. Johney Maine   Rash  since 7-8-201   red rash around bellybutton and belt line and both feet  - resolved    Past Surgical History:  Procedure Laterality Date   ESOPHAGOGASTRODUODENOSCOPY  03/26/2012   Procedure: ESOPHAGOGASTRODUODENOSCOPY (EGD);  Surgeon: Beryle Beams, MD;  Location: [facility] ENDOSCOPY;  Service: Endoscopy;  Laterality: N/A;   HERNIA REPAIR  01/30/2012   umb hernia   INGUINAL HERNIA REPAIR  01/30/2012   Procedure: LAPAROSCOPIC BILATERAL INGUINAL HERNIA REPAIR;  Surgeon: Adin Hector, MD;  Location: [facility] ORS;  Service: General;  Laterality: Bilateral;   IR RADIOLOGIST EVAL & MGMT  12/26/2020   IR RADIOLOGIST EVAL & MGMT  02/13/2021   RADIOLOGY WITH ANESTHESIA N/A 01/24/2021   Procedure: CT WITH ANESTHESIA-THERMAL  ABLATION;  Surgeon: Aletta Edouard, MD;  Location: [facility] ORS;  Service: Radiology;  Laterality: N/A;   UMBILICAL HERNIA REPAIR  01/30/2012   Procedure: HERNIA REPAIR UMBILICAL ADULT;  Surgeon: Adin Hector, MD;  Location: [facility] ORS;  Service: General;  Laterality: N/A;  Primary Umbilical Hernia Repair    Allergies: Penicillins and Sulfa antibiotics  Medications: Prior to Admission medications   Medication Sig Start Date End Date Taking? Authorizing Provider  acetaminophen (TYLENOL) 650 MG CR tablet Take 650 mg by mouth every 8 (eight) hours as needed for pain.    [provider]  hydrocortisone 2.5 % cream Apply 1 application topically daily. 04/05/17   [provider]  losartan (COZAAR) 50 MG tablet Take 50 mg by mouth daily.    [provider]  tetrahydrozoline-zinc (VISINE-AC) 0.05-0.25 % ophthalmic solution Place 2 drops into both eyes daily as needed (dry eyes).    [provider]     No family history on file.  Social History   Socioeconomic History   Marital status: Single    Spouse name: Not on file   Number of children: Not on file   Years of education: Not on file   Highest education level: Not on file  Occupational History   Occupation: Architect  Tobacco Use   Smoking status: Some Days    Packs/day: 0.50    Years: 30.00    Pack years: 15.00  Types: Cigarettes   Smokeless tobacco: Never  Vaping Use   Vaping Use: Never used  Substance and Sexual Activity   Alcohol use: Not Currently    Alcohol/week: 3.0 standard drinks    Types: 3 Cans of beer per week    Comment: quit for 45 days   Drug use: No   Sexual activity: Not Currently  Other Topics Concern   Not on file  Social History Narrative   Is concerned about a significant mold problem in his friend's home where he is living.    Social Determinants of Health   Financial Resource Strain: Not on file  Food Insecurity: Not on file  Transportation Needs: Not on file   Physical Activity: Not on file  Stress: Not on file  Social Connections: Not on file    ECOG Status: 0 - Asymptomatic  Review of Systems  Constitutional: Negative.   Respiratory: Negative.    Cardiovascular: Negative.   Gastrointestinal: Negative.   Genitourinary: Negative.   Musculoskeletal: Negative.   Neurological: Negative.    Review of Systems: A 12 point ROS discussed and pertinent positives are indicated in the HPI above.  All other systems are negative.  Physical Exam No direct physical exam was performed (except for noted visual exam findings with Video Visits).   Vital Signs: There were no vitals taken for this visit.  Imaging: IR Radiologist Eval & Mgmt  Result Date: 02/13/2021 Please refer to notes tab for details about interventional procedure. (Op Note)   Labs:  CBC: Recent Labs    08/22/20 2023 08/22/20 2029 01/12/21 1441 01/24/21 1030 01/25/21 0545  WBC 5.8  --  4.0 5.9 8.3  HGB 16.4 16.3 14.8 15.5 14.4  HCT 45.2 48.0 41.9 42.9 40.6  PLT 117*  --  111* 133* 117*    COAGS: Recent Labs    08/22/20 2023 01/12/21 1441  INR 1.1 1.2  APTT 31 33    BMP: Recent Labs    08/22/20 2023 08/22/20 2029 01/12/21 1441 01/25/21 0545  NA 132* 132* 137 131*  K 3.9 3.9 4.3 4.2  CL 100 102 104 101  CO2 20*  --  27 21*  GLUCOSE 138* 135* 154* 279*  BUN 18 21 22 22   CALCIUM 9.8  --  9.6 9.1  CREATININE 1.30* 1.40* 1.10 1.15  GFRNONAA >60  --  >60 >60    LIVER FUNCTION TESTS: Recent Labs    08/22/20 2023 01/12/21 1441  BILITOT 1.2 0.9  AST 116* 52*  ALT 101* 44  ALKPHOS 95 94  PROT 7.5 7.3  ALBUMIN 3.5 3.5     Assessment and Plan:  Status post thermal ablation of a 3 cm left lobe hepatocellular carcinoma.  I told the patient that he can now pursue antiviral treatment for his hepatitis C which was being withheld prior to treatment of the hepatocellular carcinoma.  I recommended a follow-up MRI roughly 3 months after the procedure in  early November.  I will meet with [name] following the MRI.  Follow-up AFP level around that time would also be helpful.   Electronically Signed: Azzie Roup 03/14/2021, 11:10 AM    I spent a total of  10 Minutes in remote  clinical consultation, greater than 50% of which was counseling/coordinating care for hepatocellular carcinoma.    Visit type: Audio only (telephone). Audio (no video) only due to patient's lack of internet/smartphone capability. Alternative for in-person consultation at [facility], [address] [place], [place], [place]. This  visit type was conducted due to national recommendations for restrictions regarding the COVID-19 Pandemic (e.g. social distancing).  This format is felt to be most appropriate for this patient at this time.  All issues noted in this document were discussed and addressed.

## 2021-03-14 NOTE — Addendum Note (Signed)
Encounter addended by: Aletta Edouard, MD on: 03/14/2021 11:20 AM  Actions taken: Clinical Note Signed, SmartForm saved

## 2021-03-15 ENCOUNTER — Telehealth: Payer: BC Managed Care – PPO

## 2021-03-30 DIAGNOSIS — B182 Chronic viral hepatitis C: Secondary | ICD-10-CM | POA: Diagnosis not present

## 2021-03-30 DIAGNOSIS — K7469 Other cirrhosis of liver: Secondary | ICD-10-CM | POA: Diagnosis not present

## 2021-03-30 DIAGNOSIS — C220 Liver cell carcinoma: Secondary | ICD-10-CM | POA: Diagnosis not present

## 2021-03-30 DIAGNOSIS — R748 Abnormal levels of other serum enzymes: Secondary | ICD-10-CM | POA: Diagnosis not present

## 2021-04-27 ENCOUNTER — Other Ambulatory Visit: Payer: Self-pay | Admitting: Nurse Practitioner

## 2021-04-27 ENCOUNTER — Other Ambulatory Visit: Payer: Self-pay | Admitting: Interventional Radiology

## 2021-04-27 DIAGNOSIS — C220 Liver cell carcinoma: Secondary | ICD-10-CM

## 2021-05-25 ENCOUNTER — Other Ambulatory Visit: Payer: Self-pay

## 2021-05-25 ENCOUNTER — Ambulatory Visit (HOSPITAL_COMMUNITY)
Admission: RE | Admit: 2021-05-25 | Discharge: 2021-05-25 | Disposition: A | Discharge status: Home / Self Care | Payer: Medicare HMO | Source: Ambulatory Visit | Attending: Interventional Radiology | Admitting: Interventional Radiology

## 2021-05-25 DIAGNOSIS — C220 Liver cell carcinoma: Secondary | ICD-10-CM | POA: Insufficient documentation

## 2021-05-25 DIAGNOSIS — I868 Varicose veins of other specified sites: Secondary | ICD-10-CM | POA: Diagnosis not present

## 2021-05-25 DIAGNOSIS — Z8505 Personal history of malignant neoplasm of liver: Secondary | ICD-10-CM | POA: Diagnosis not present

## 2021-05-25 DIAGNOSIS — I700 Atherosclerosis of aorta: Secondary | ICD-10-CM | POA: Diagnosis not present

## 2021-05-25 DIAGNOSIS — K760 Fatty (change of) liver, not elsewhere classified: Secondary | ICD-10-CM | POA: Diagnosis not present

## 2021-05-25 IMAGING — MR MR ABDOMEN WO/W CM
18 series · 48 of 48 positions shown · IV contrast (gadavist)
Comparison: Abdominal MRI [DATE].

CLINICAL DATA: 64-year-old male with history of hepatocellular
carcinoma status post thermal ablation on [DATE]. Follow-up
study.

EXAM:
MRI ABDOMEN WITHOUT AND WITH CONTRAST
TECHNIQUE: Multiplanar multisequence MR imaging of the abdomen was performed
both before and after the administration of intravenous contrast.
CONTRAST:  6mL GADAVIST GADOBUTROL 1 MMOL/ML IV SOLN

[Series 3: T2 fat-sat · axial · 6.0mm · 1.25mm/px · z∈[-112,+140]mm · 2 of 36 slices shown]
[im 1/36]
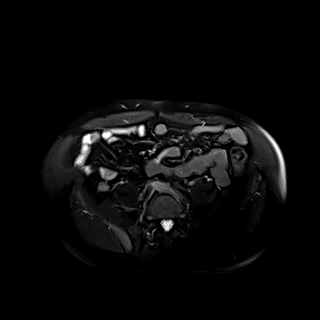
[im 36/36]
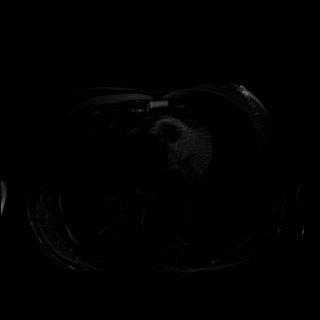

[Series 5: DWI · axial · 6.0mm · 1.49mm/px · z∈[-127,+154]mm · 4 of 80 slices shown (1 of 2)]
[im 1/80]
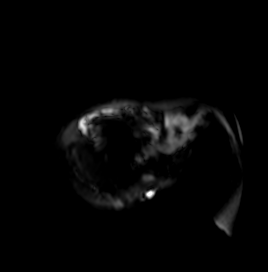
[im 27/80]
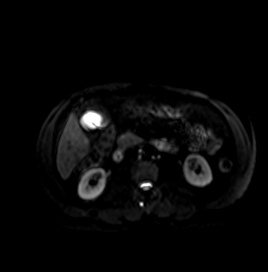
[im 53/80]
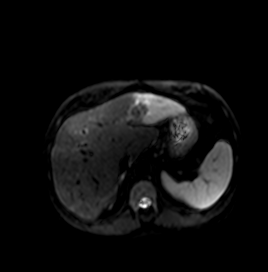
[im 80/80]
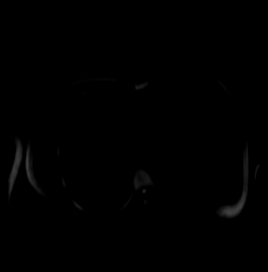

[Series 6: DWI · axial · 6.0mm · 1.49mm/px · z∈[-127,+154]mm · 2 of 40 slices shown (2 of 2)]
[im 1/40]
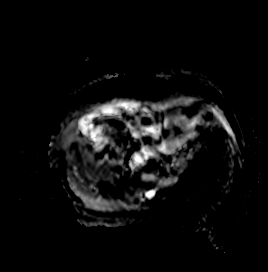
[im 40/40]
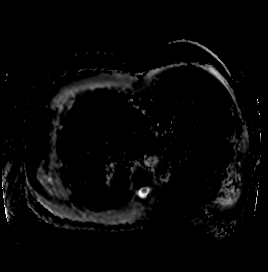

[Series 7: T2 · coronal · 6.0mm · 1.56mm/px · 2 of 35 slices shown (1 of 2)]
[im 1/35]
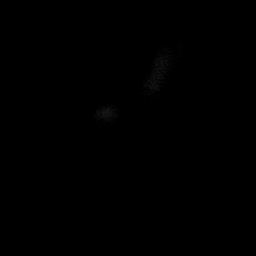
[im 35/35]
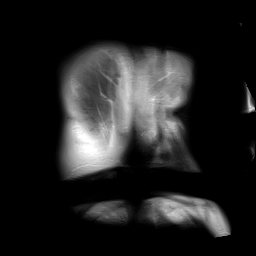

[Series 8: T1 · axial · 3.0mm · 1.25mm/px · z∈[-112,+125]mm · 3 of 80 slices shown (1 of 2)]
[im 1/80]
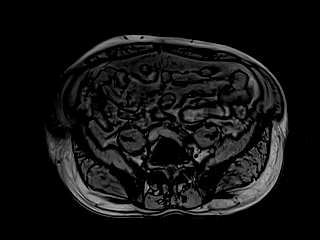
[im 40/80]
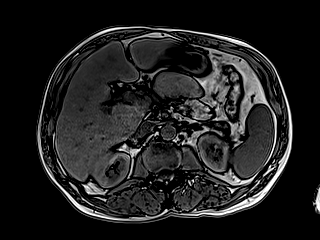
[im 80/80]
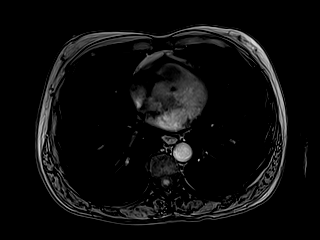

[Series 9: T1 · axial · 3.0mm · 1.25mm/px · z∈[-112,+125]mm · 3 of 80 slices shown (2 of 2)]
[im 1/80]
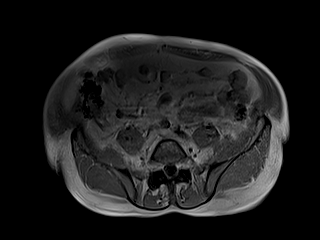
[im 40/80]
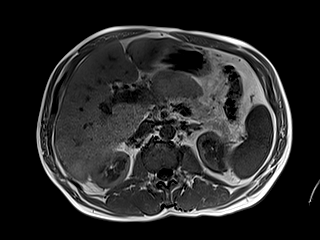
[im 80/80]
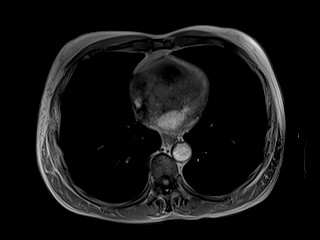

[Series 10: bSSFP · axial · 4.0mm · 0.84mm/px · z∈[-127,+125]mm · 2 of 64 slices shown]
[im 1/64]
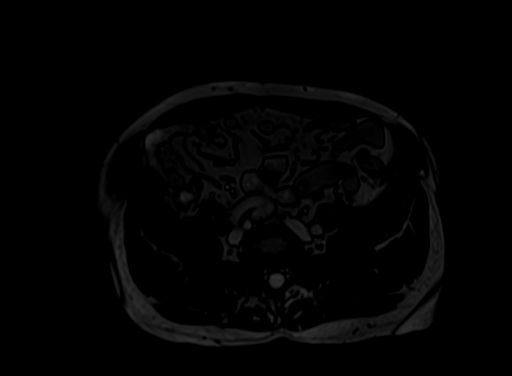
[im 64/64]
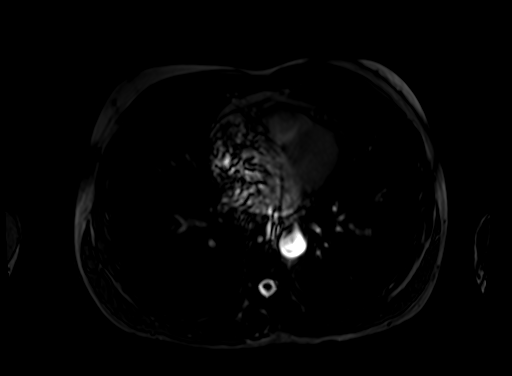

[Series 12: T1 dynamic · axial · 3.0mm · 1.25mm/px · z∈[-129,+132]mm · 3 of 88 slices shown (1 of 10)]
[im 1/88]
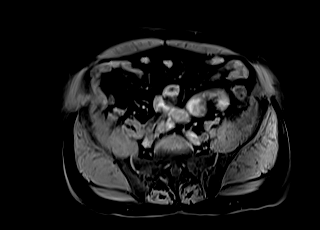
[im 44/88]
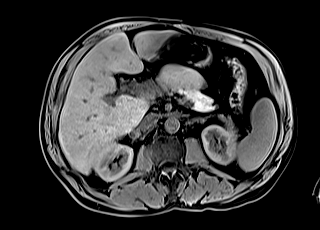
[im 88/88]
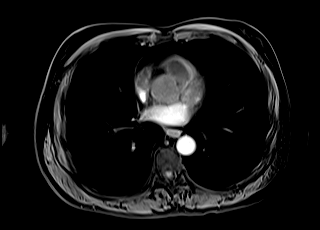

[Series 16: T1 dynamic · axial · 3.0mm · 1.25mm/px · z∈[-129,+132]mm · 3 of 88 slices shown (2 of 10)]
[im 1/88]
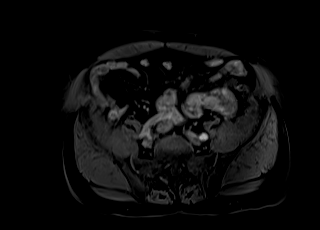
[im 44/88]
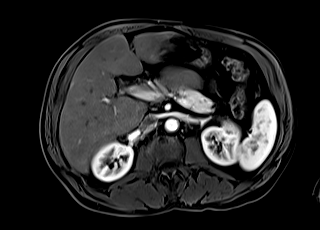
[im 88/88]
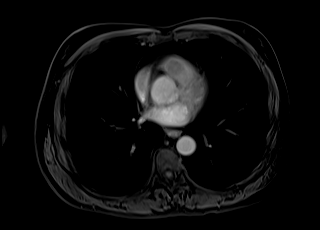

[Series 17: T1 dynamic · axial · 3.0mm · 1.25mm/px · z∈[-129,+132]mm · 3 of 88 slices shown (3 of 10)]
[im 1/88]
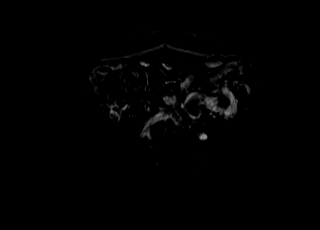
[im 44/88]
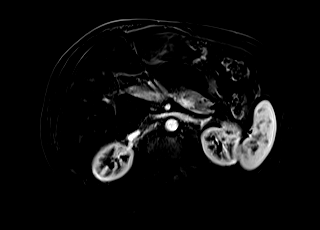
[im 88/88]
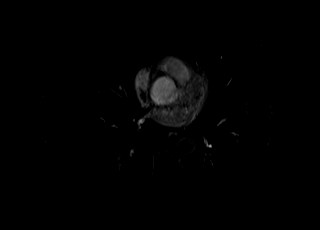

[Series 20: T1 dynamic · axial · 3.0mm · 1.25mm/px · z∈[-129,+132]mm · 3 of 88 slices shown (4 of 10)]
[im 1/88]
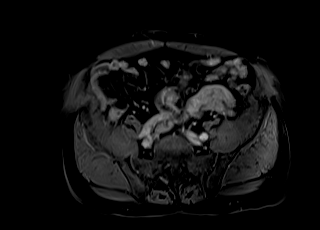
[im 44/88]
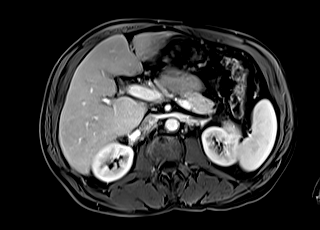
[im 88/88]
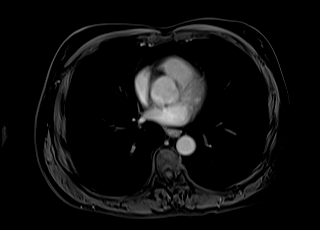

[Series 21: T1 dynamic · axial · 3.0mm · 1.25mm/px · z∈[-129,+132]mm · 3 of 88 slices shown (5 of 10)]
[im 1/88]
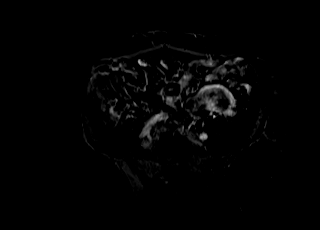
[im 44/88]
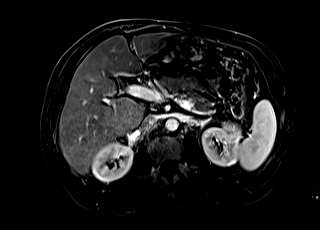
[im 88/88]
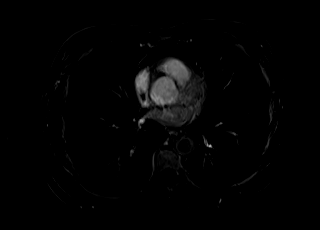

[Series 24: T1 dynamic · axial · 3.0mm · 1.25mm/px · z∈[-129,+132]mm · 3 of 88 slices shown (6 of 10)]
[im 1/88]
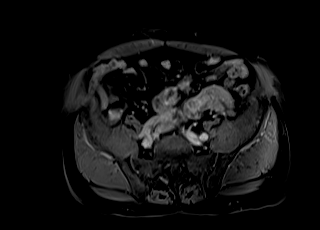
[im 44/88]
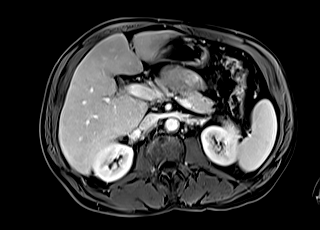
[im 88/88]
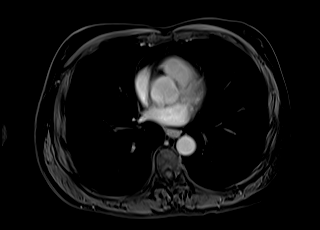

[Series 25: T1 dynamic · axial · 3.0mm · 1.25mm/px · z∈[-129,+132]mm · 3 of 88 slices shown (7 of 10)]
[im 1/88]
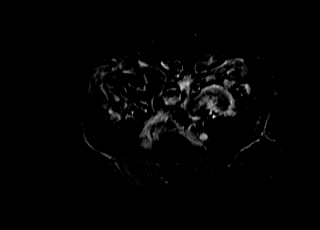
[im 44/88]
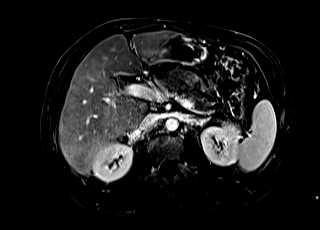
[im 88/88]
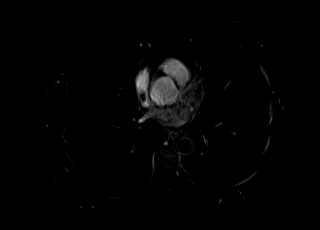

[Series 27: T1 dynamic · coronal · 4.0mm · 1.41mm/px · 2 of 60 slices shown (8 of 10)]
[im 1/60]
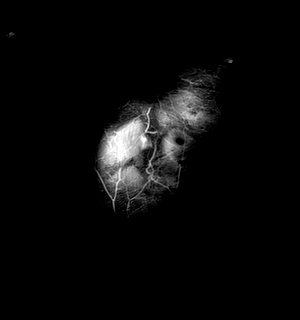
[im 60/60]
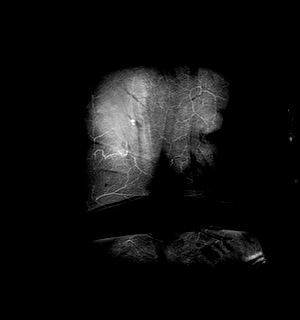

[Series 28: T2 · axial · 6.0mm · 1.56mm/px · 1 of 40 slices shown (2 of 2)]
[im 1/40]
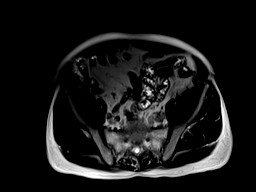

[Series 31: T1 dynamic · axial · 3.0mm · 1.25mm/px · z∈[-129,+132]mm · 3 of 88 slices shown (9 of 10)]
[im 1/88]
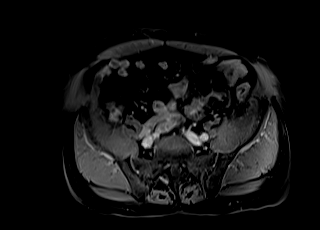
[im 44/88]
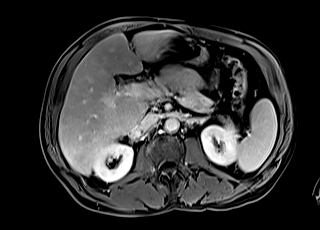
[im 88/88]
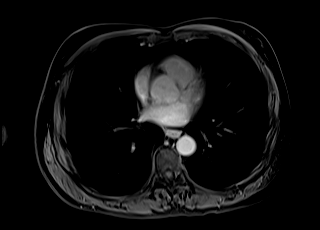

[Series 32: T1 dynamic · axial · 3.0mm · 1.25mm/px · z∈[-129,+132]mm · 3 of 88 slices shown (10 of 10)]
[im 1/88]
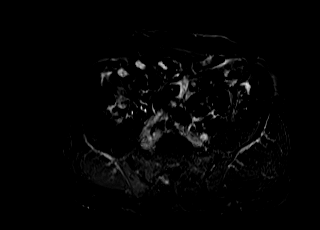
[im 44/88]
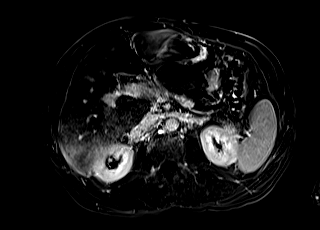
[im 88/88]
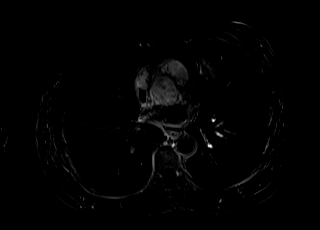

[48 of 48 positions shown; findings below may reference images not displayed]

FINDINGS: Lower chest: Unremarkable.

Hepatobiliary: Mild diffuse loss of signal intensity throughout the
hepatic parenchyma on out of phase dual echo images. Liver has a
slightly nodular contour, indicative of underlying cirrhosis. At the
site of the previously ablated lesion in the central aspect of
segment 2 of the liver there is a well-defined T1 hyperintense T2
hypointense area measuring 2.3 x 1.7 cm (axial image 30 of series
12) which does not enhance. There is hyperenhancement of the
adjacent portions of segment 2, and to a lesser extent portions of
segment 3 during arterial phase and early portal venous phase
imaging, which normalizes on more delayed phase imaging, presumably
a postprocedural related perfusion anomaly. No new hypervascular
lesions are otherwise noted. There is some mild intrahepatic biliary
ductal dilatation noted in segment 2 of the liver, distal to the
ablated lesion. No other intrahepatic biliary ductal dilatation.
Common bile duct is normal in caliber measuring 5 mm in the porta
hepatis. No filling defects in the common bile duct to suggest
choledocholithiasis. Gallbladder is normal in appearance.

Pancreas: No pancreatic mass. No pancreatic ductal dilatation. No
pancreatic or peripancreatic fluid collections or inflammatory
changes.

Spleen:  Unremarkable.

Adrenals/Urinary Tract: Bilateral kidneys and bilateral adrenal
glands are normal in appearance. No hydroureteronephrosis in the
visualized portions of the abdomen.

Stomach/Bowel: Visualized portions are unremarkable.

Vascular/Lymphatic: Aortic atherosclerosis. No aneurysm identified
in the visualized abdominal vasculature. Portal vein is mildly
dilated measuring 15 mm in the porta hepatis. No lymphadenopathy
noted in the abdomen.

Other: No significant volume of ascites noted in the visualized
portions of the peritoneal cavity.

Musculoskeletal: No aggressive appearing osseous lesions are noted
in the visualized portions of the skeleton.
IMPRESSION: 1. Mild hepatic steatosis and morphologic changes indicative of
underlying cirrhosis, with post ablation changes from prior thermal
ablation to segment 2 of the liver. The treated lesion demonstrates
no definitive evidence to suggest residual or recurrent tumor at
this time. This treated lesion is associated with some very mild
intrahepatic biliary ductal dilatation which is limited to segment 2
of the liver, and there is an associated benign perfusion anomaly,
as detailed above. No signs of metastatic disease are noted in the
abdomen.
2. Aortic atherosclerosis.

## 2021-05-25 MED ORDER — GADOBUTROL 1 MMOL/ML IV SOLN
6.0000 mL | Freq: Once | INTRAVENOUS | Status: AC | PRN
  Administered 2021-05-25: 6 mL via INTRAVENOUS

## 2021-05-31 ENCOUNTER — Encounter: Payer: Self-pay | Admitting: *Deleted

## 2021-05-31 ENCOUNTER — Ambulatory Visit
Admission: RE | Admit: 2021-05-31 | Discharge: 2021-05-31 | Disposition: A | Discharge status: Home / Self Care | Payer: BC Managed Care – PPO | Source: Ambulatory Visit | Attending: Interventional Radiology | Admitting: Interventional Radiology

## 2021-05-31 ENCOUNTER — Other Ambulatory Visit: Payer: Self-pay

## 2021-05-31 DIAGNOSIS — C220 Liver cell carcinoma: Secondary | ICD-10-CM

## 2021-05-31 HISTORY — PX: IR RADIOLOGIST EVAL & MGMT: IMG5224

## 2021-05-31 NOTE — Progress Notes (Signed)
Chief Complaint: Patient was consulted remotely today (TeleHealth) for follow-up after thermal ablation of hepatocellular carcinoma  History of Present Illness: Levi Conner is a 65 y.o. male status post percutaneous microwave thermal ablation of a 3 cm segment II hepatocellular carcinoma within the left lobe of the liver on 01/24/2021.  He has done well after the procedure and denies any current symptoms. He is currently undergoing Epclusa therapy for chronic hepatitis C.  Past Medical History:  Diagnosis Date   Arthritis    Borderline diabetic    diet controlled, pt does not check cbg   Colon polyps    Diabetes mellitus without complication (HCC)    no meds   Hemorrhoids    Hepatitis C    tumor on liver treated befor can treat Hep C   Hernia, inguinal, bilateral    current   Hypertension    Melanoma ([place])    Pneumonia 10/23/2011   HOSPITALIZED FOR PNEUMONIA MAY 2013.  PT HAS PULMONARY CLEARANCE FROM DR. WERT FOR HERNIA REPAIR WITH DR. Johney Maine   Rash  since 7-8-201   red rash around bellybutton and belt line and both feet  - resolved    Past Surgical History:  Procedure Laterality Date   ESOPHAGOGASTRODUODENOSCOPY  03/26/2012   Procedure: ESOPHAGOGASTRODUODENOSCOPY (EGD);  Surgeon: Beryle Beams, MD;  Location: [facility] ENDOSCOPY;  Service: Endoscopy;  Laterality: N/A;   HERNIA REPAIR  01/30/2012   umb hernia   INGUINAL HERNIA REPAIR  01/30/2012   Procedure: LAPAROSCOPIC BILATERAL INGUINAL HERNIA REPAIR;  Surgeon: Adin Hector, MD;  Location: [facility] ORS;  Service: General;  Laterality: Bilateral;   IR RADIOLOGIST EVAL & MGMT  12/26/2020   IR RADIOLOGIST EVAL & MGMT  02/13/2021   IR RADIOLOGIST EVAL & MGMT  05/31/2021   RADIOLOGY WITH ANESTHESIA N/A 01/24/2021   Procedure: CT WITH ANESTHESIA-THERMAL ABLATION;  Surgeon: Aletta Edouard, MD;  Location: [facility]S;  Service: Radiology;  Laterality: N/A;   UMBILICAL HERNIA REPAIR  01/30/2012   Procedure: HERNIA REPAIR UMBILICAL  ADULT;  Surgeon: Adin Hector, MD;  Location: [facility];  Service: General;  Laterality: N/A;  Primary Umbilical Hernia Repair    Allergies: Penicillins and Sulfa antibiotics  Medications: Prior to Admission medications   Medication Sig Start Date End Date Taking? Authorizing Provider  acetaminophen (TYLENOL) 650 MG CR tablet Take 650 mg by mouth every 8 (eight) hours as needed for pain.    [provider]  hydrocortisone 2.5 % cream Apply 1 application topically daily. 04/05/17   [provider]  losartan (COZAAR) 50 MG tablet Take 50 mg by mouth daily.    [provider]  tetrahydrozoline-zinc (VISINE-AC) 0.05-0.25 % ophthalmic solution Place 2 drops into both eyes daily as needed (dry eyes).    [provider]     No family history on file.  Social History   Socioeconomic History   Marital status: Single    Spouse name: Not on file   Number of children: Not on file   Years of education: Not on file   Highest education level: Not on file  Occupational History   Occupation: Architect  Tobacco Use   Smoking status: Some Days    Packs/day: 0.50    Years: 30.00    Pack years: 15.00    Types: Cigarettes   Smokeless tobacco: Never  Vaping Use   Vaping Use: Never used  Substance and Sexual Activity   Alcohol use: Not Currently    Alcohol/week:  3.0 standard drinks    Types: 3 Cans of beer per week    Comment: quit for 45 days   Drug use: No   Sexual activity: Not Currently  Other Topics Concern   Not on file  Social History Narrative   Is concerned about a significant mold problem in his friend's home where he is living.    Social Determinants of Health   Financial Resource Strain: Not on file  Food Insecurity: Not on file  Transportation Needs: Not on file  Physical Activity: Not on file  Stress: Not on file  Social Connections: Not on file    ECOG Status: 0 - Asymptomatic  Review of Systems  Review of Systems: A 12  point ROS discussed and pertinent positives are indicated in the HPI above.  All other systems are negative.  Physical Exam No direct physical exam was performed (except for noted visual exam findings with Video Visits).   Vital Signs: There were no vitals taken for this visit.  Imaging: MR ABDOMEN WWO CONTRAST  Result Date: 05/26/2021 CLINICAL DATA:  65 year old male with history of hepatocellular carcinoma status post thermal ablation on 01/24/2021. Follow-up study. EXAM: MRI ABDOMEN WITHOUT AND WITH CONTRAST TECHNIQUE: Multiplanar multisequence MR imaging of the abdomen was performed both before and after the administration of intravenous contrast. CONTRAST:  30mL GADAVIST GADOBUTROL 1 MMOL/ML IV SOLN COMPARISON:  Abdominal MRI 12/20/2020. FINDINGS: Lower chest: Unremarkable. Hepatobiliary: Mild diffuse loss of signal intensity throughout the hepatic parenchyma on out of phase dual echo images. Liver has a slightly nodular contour, indicative of underlying cirrhosis. At the site of the previously ablated lesion in the central aspect of segment 2 of the liver there is a well-defined T1 hyperintense T2 hypointense area measuring 2.3 x 1.7 cm (axial image 30 of series 12) which does not enhance. There is hyperenhancement of the adjacent portions of segment 2, and to a lesser extent portions of segment 3 during arterial phase and early portal venous phase imaging, which normalizes on more delayed phase imaging, presumably a postprocedural related perfusion anomaly. No new hypervascular lesions are otherwise noted. There is some mild intrahepatic biliary ductal dilatation noted in segment 2 of the liver, distal to the ablated lesion. No other intrahepatic biliary ductal dilatation. Common bile duct is normal in caliber measuring 5 mm in the porta hepatis. No filling defects in the common bile duct to suggest choledocholithiasis. Gallbladder is normal in appearance. Pancreas: No pancreatic mass. No pancreatic  ductal dilatation. No pancreatic or peripancreatic fluid collections or inflammatory changes. Spleen:  Unremarkable. Adrenals/Urinary Tract: Bilateral kidneys and bilateral adrenal glands are normal in appearance. No hydroureteronephrosis in the visualized portions of the abdomen. Stomach/Bowel: Visualized portions are unremarkable. Vascular/Lymphatic: Aortic atherosclerosis. No aneurysm identified in the visualized abdominal vasculature. Portal vein is mildly dilated measuring 15 mm in the porta hepatis. No lymphadenopathy noted in the abdomen. Other: No significant volume of ascites noted in the visualized portions of the peritoneal cavity. Musculoskeletal: No aggressive appearing osseous lesions are noted in the visualized portions of the skeleton. IMPRESSION: 1. Mild hepatic steatosis and morphologic changes indicative of underlying cirrhosis, with post ablation changes from prior thermal ablation to segment 2 of the liver. The treated lesion demonstrates no definitive evidence to suggest residual or recurrent tumor at this time. This treated lesion is associated with some very mild intrahepatic biliary ductal dilatation which is limited to segment 2 of the liver, and there is an associated benign perfusion anomaly, as detailed above.  No signs of metastatic disease are noted in the abdomen. 2. Aortic atherosclerosis. Electronically Signed   By: Vinnie Langton M.D.   On: 05/26/2021 06:48   IR Radiologist Eval & Mgmt  Result Date: 05/31/2021 Please refer to notes tab for details about interventional procedure. (Op Note)   Labs:  CBC: Recent Labs    08/22/20 2023 08/22/20 2029 01/12/21 1441 01/24/21 1030 01/25/21 0545  WBC 5.8  --  4.0 5.9 8.3  HGB 16.4 16.3 14.8 15.5 14.4  HCT 45.2 48.0 41.9 42.9 40.6  PLT 117*  --  111* 133* 117*    COAGS: Recent Labs    08/22/20 2023 01/12/21 1441  INR 1.1 1.2  APTT 31 33    BMP: Recent Labs    08/22/20 2023 08/22/20 2029 01/12/21 1441  01/25/21 0545  NA 132* 132* 137 131*  K 3.9 3.9 4.3 4.2  CL 100 102 104 101  CO2 20*  --  27 21*  GLUCOSE 138* 135* 154* 279*  BUN 18 21 22 22   CALCIUM 9.8  --  9.6 9.1  CREATININE 1.30* 1.40* 1.10 1.15  GFRNONAA >60  --  >60 >60    LIVER FUNCTION TESTS: Recent Labs    08/22/20 2023 01/12/21 1441  BILITOT 1.2 0.9  AST 116* 52*  ALT 101* 44  ALKPHOS 95 94  PROT 7.5 7.3  ALBUMIN 3.5 3.5     Assessment and Plan:  Follow-up MRI of the abdomen was performed on 05/25/2021.  There is an ablation defect in the left lobe surrounding the previous enhancing hepatocellular carcinoma with no evidence of contrast-enhancement following ablation.  No new hypervascular masses are identified in the liver.  There is a perfusion abnormality with associated mild intrahepatic biliary ductal dilatation within segment II of the liver peripheral to the ablated lesion.  This is most likely secondary to ablation and should not be of any clinical significance.  I recommended a follow-up MRI in approximately 6 months.  He will also be following up with Roosevelt Locks, NP with [place] [facility] after completing Epclusa treatment.   Electronically Signed: Azzie Roup 05/31/2021, 4:12 PM     I spent a total of 10 Minutes in remote  clinical consultation, greater than 50% of which was counseling/coordinating care for hepatocellular carcinoma.    Visit type: Audio only (telephone). Audio (no video) only due to patient's lack of internet/smartphone capability. Alternative for in-person consultation at [facility], [place] [address], [place], [place]. This visit type was conducted due to national recommendations for restrictions regarding the COVID-19 Pandemic (e.g. social distancing).  This format is felt to be most appropriate for this patient at this time.  All issues noted in this document were discussed and addressed.

## 2021-06-27 DIAGNOSIS — K7469 Other cirrhosis of liver: Secondary | ICD-10-CM | POA: Diagnosis not present

## 2021-06-27 DIAGNOSIS — B182 Chronic viral hepatitis C: Secondary | ICD-10-CM | POA: Diagnosis not present

## 2021-06-27 DIAGNOSIS — C220 Liver cell carcinoma: Secondary | ICD-10-CM | POA: Diagnosis not present

## 2021-07-09 DIAGNOSIS — Z1331 Encounter for screening for depression: Secondary | ICD-10-CM | POA: Diagnosis not present

## 2021-07-09 DIAGNOSIS — E119 Type 2 diabetes mellitus without complications: Secondary | ICD-10-CM | POA: Diagnosis not present

## 2021-07-09 DIAGNOSIS — Z9181 History of falling: Secondary | ICD-10-CM | POA: Diagnosis not present

## 2021-07-09 DIAGNOSIS — B182 Chronic viral hepatitis C: Secondary | ICD-10-CM | POA: Diagnosis not present

## 2021-07-09 DIAGNOSIS — I10 Essential (primary) hypertension: Secondary | ICD-10-CM | POA: Diagnosis not present

## 2021-07-09 DIAGNOSIS — R059 Cough, unspecified: Secondary | ICD-10-CM | POA: Diagnosis not present

## 2021-07-09 DIAGNOSIS — J069 Acute upper respiratory infection, unspecified: Secondary | ICD-10-CM | POA: Diagnosis not present

## 2021-07-09 DIAGNOSIS — F17210 Nicotine dependence, cigarettes, uncomplicated: Secondary | ICD-10-CM | POA: Diagnosis not present

## 2021-07-09 DIAGNOSIS — L719 Rosacea, unspecified: Secondary | ICD-10-CM | POA: Diagnosis not present

## 2021-08-09 DIAGNOSIS — F17210 Nicotine dependence, cigarettes, uncomplicated: Secondary | ICD-10-CM | POA: Diagnosis not present

## 2021-08-09 DIAGNOSIS — I10 Essential (primary) hypertension: Secondary | ICD-10-CM | POA: Diagnosis not present

## 2021-08-09 DIAGNOSIS — C220 Liver cell carcinoma: Secondary | ICD-10-CM | POA: Diagnosis not present

## 2021-08-09 DIAGNOSIS — Z6823 Body mass index (BMI) 23.0-23.9, adult: Secondary | ICD-10-CM | POA: Diagnosis not present

## 2021-08-09 DIAGNOSIS — K7469 Other cirrhosis of liver: Secondary | ICD-10-CM | POA: Diagnosis not present

## 2021-08-09 DIAGNOSIS — E441 Mild protein-calorie malnutrition: Secondary | ICD-10-CM | POA: Diagnosis not present

## 2021-08-09 DIAGNOSIS — B182 Chronic viral hepatitis C: Secondary | ICD-10-CM | POA: Diagnosis not present

## 2021-09-11 DIAGNOSIS — Z6823 Body mass index (BMI) 23.0-23.9, adult: Secondary | ICD-10-CM | POA: Diagnosis not present

## 2021-09-11 DIAGNOSIS — I10 Essential (primary) hypertension: Secondary | ICD-10-CM | POA: Diagnosis not present

## 2021-09-11 DIAGNOSIS — F17210 Nicotine dependence, cigarettes, uncomplicated: Secondary | ICD-10-CM | POA: Diagnosis not present

## 2021-09-24 DIAGNOSIS — B182 Chronic viral hepatitis C: Secondary | ICD-10-CM | POA: Diagnosis not present

## 2021-09-24 DIAGNOSIS — K7469 Other cirrhosis of liver: Secondary | ICD-10-CM | POA: Diagnosis not present

## 2021-09-24 DIAGNOSIS — C220 Liver cell carcinoma: Secondary | ICD-10-CM | POA: Diagnosis not present

## 2021-09-26 ENCOUNTER — Other Ambulatory Visit: Payer: Self-pay | Admitting: Nurse Practitioner

## 2021-09-26 DIAGNOSIS — C220 Liver cell carcinoma: Secondary | ICD-10-CM | POA: Diagnosis not present

## 2021-09-26 DIAGNOSIS — K7469 Other cirrhosis of liver: Secondary | ICD-10-CM | POA: Diagnosis not present

## 2021-09-26 DIAGNOSIS — B182 Chronic viral hepatitis C: Secondary | ICD-10-CM | POA: Diagnosis not present

## 2021-09-26 DIAGNOSIS — R748 Abnormal levels of other serum enzymes: Secondary | ICD-10-CM | POA: Diagnosis not present

## 2021-10-11 ENCOUNTER — Ambulatory Visit
Admission: RE | Admit: 2021-10-11 | Discharge: 2021-10-11 | Disposition: A | Discharge status: Home / Self Care | Payer: Medicare HMO | Source: Ambulatory Visit | Attending: Nurse Practitioner | Admitting: Nurse Practitioner

## 2021-10-11 DIAGNOSIS — C220 Liver cell carcinoma: Secondary | ICD-10-CM

## 2021-10-11 DIAGNOSIS — K7460 Unspecified cirrhosis of liver: Secondary | ICD-10-CM | POA: Diagnosis not present

## 2021-10-11 DIAGNOSIS — K7689 Other specified diseases of liver: Secondary | ICD-10-CM | POA: Diagnosis not present

## 2021-10-11 IMAGING — MR MR ABDOMEN WO/W CM
12 of 19 series · 26 of 48 positions shown · IV contrast (14ml Multihance)
Comparison: [DATE]

CLINICAL DATA: Follow-up hepatocellular carcinoma. 8 months status
post thermal ablation.

EXAM:
MRI ABDOMEN WITHOUT AND WITH CONTRAST
TECHNIQUE: Multiplanar multisequence MR imaging of the abdomen was performed
both before and after the administration of intravenous contrast.
CONTRAST:  14mL MULTIHANCE GADOBENATE DIMEGLUMINE 529 MG/ML IV SOLN

[Series 3: cor haste · coronal · 5.0mm · 0.68mm/px · 2 of 35 slices shown]
[im 1/35]
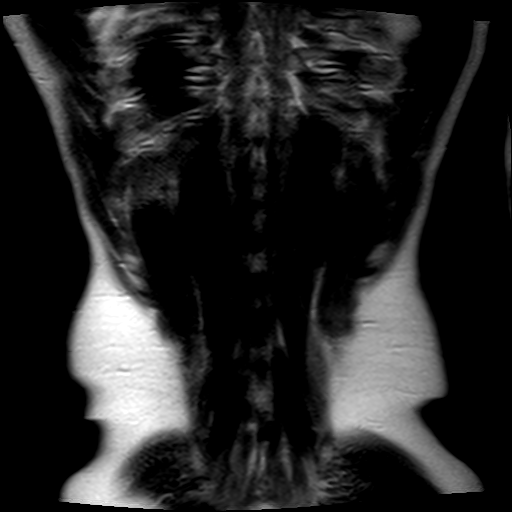
[im 35/35]
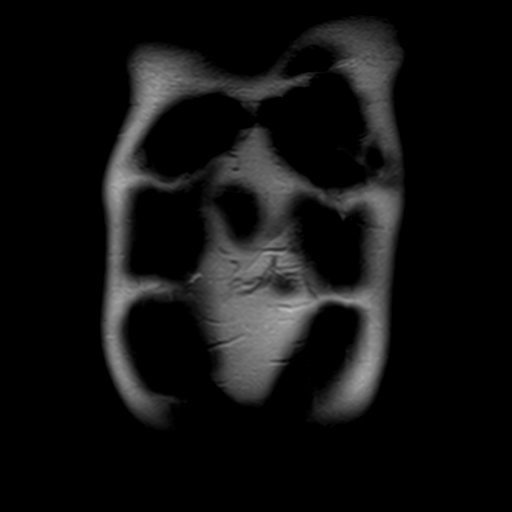

[Series 4: axial haste · axial · 6.0mm · 0.68mm/px · 1 of 33 slices shown]
[im 1/33]
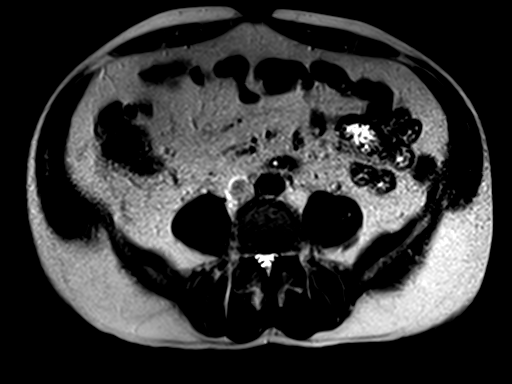

[Series 5: T1 · axial · 6.0mm · 0.68mm/px · z∈[-125,+86]mm · 2 of 66 slices shown]
[im 1/66]
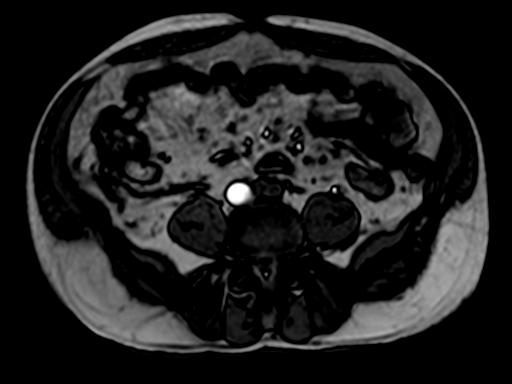
[im 66/66]
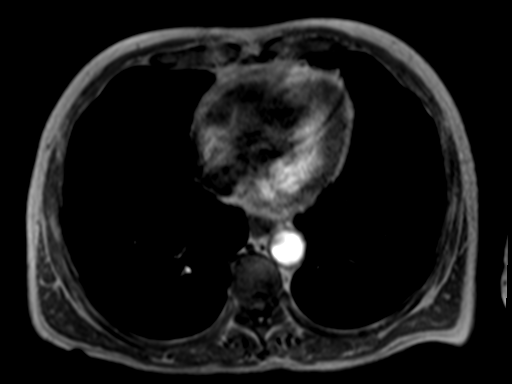

[Series 6: bSSFP · axial · 4.0mm · 0.68mm/px · z∈[-152,+88]mm · 2 of 61 slices shown]
[im 1/61]
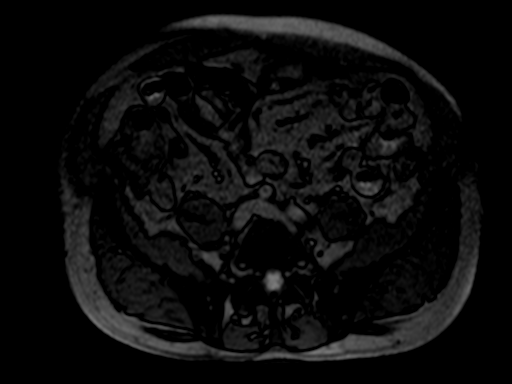
[im 61/61]
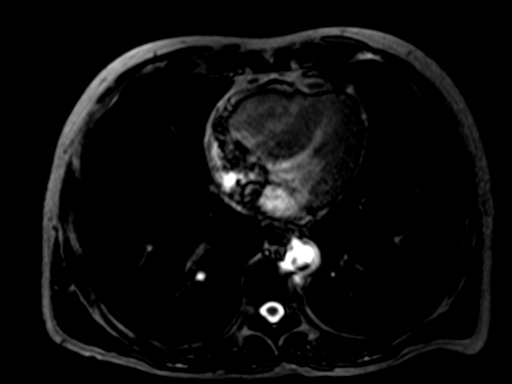

[Series 7: T2 fat-sat · axial · 6.0mm · 1.09mm/px · 1 of 32 slices shown]
[im 1/32]
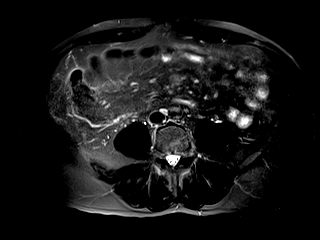

[Series 8: ep2d_diff_b50_500_800_p2_trig · axial · 6.0mm · 1.82mm/px · z∈[-118,+106]mm · 3 of 96 slices shown]
[im 1/96]
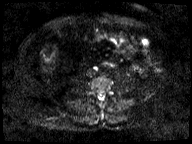
[im 48/96]
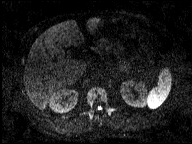
[im 96/96]
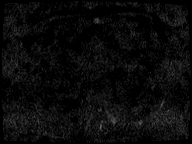

[Series 9: ep2d_[id number]_500_800_p2_trig_adc · axial · 6.0mm · 1.82mm/px · 1 of 32 slices shown]
[im 1/32]
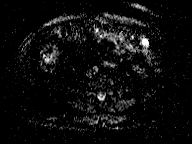

[Series 10: T1 dynamic · axial · non-contrast · 2.5mm · 0.74mm/px · z∈[-128,+89]mm · 3 of 88 slices shown]
[im 1/88]
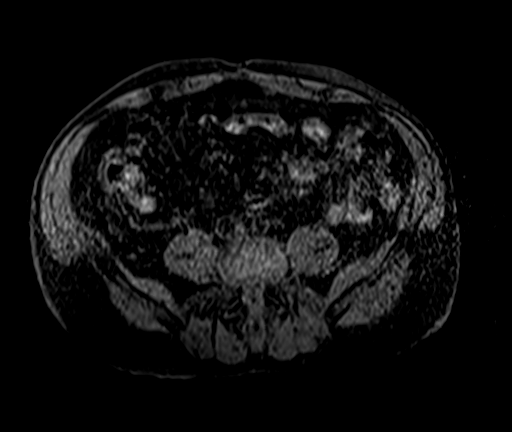
[im 44/88]
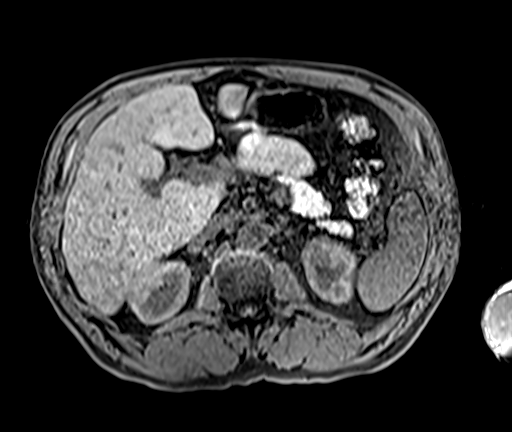
[im 88/88]
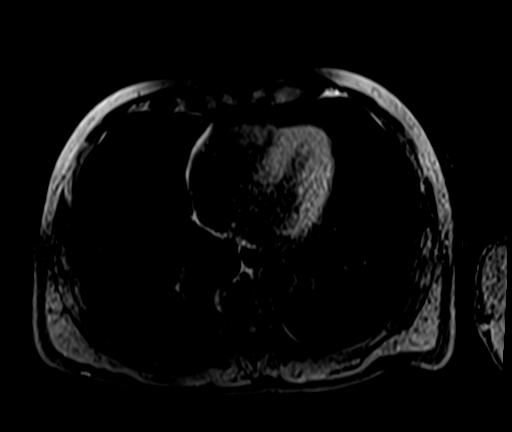

[Series 11: T1 dynamic post-contrast · axial · 2.5mm · 0.74mm/px · z∈[-128,+89]mm · 3 of 88 slices shown (1 of 4)]
[im 1/88]
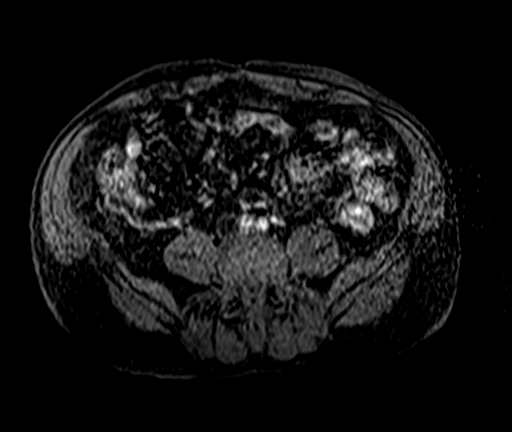
[im 44/88]
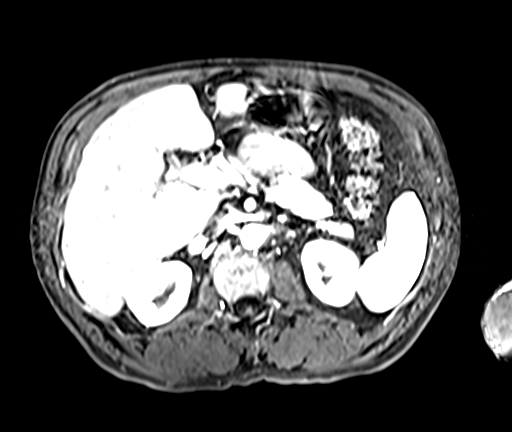
[im 88/88]
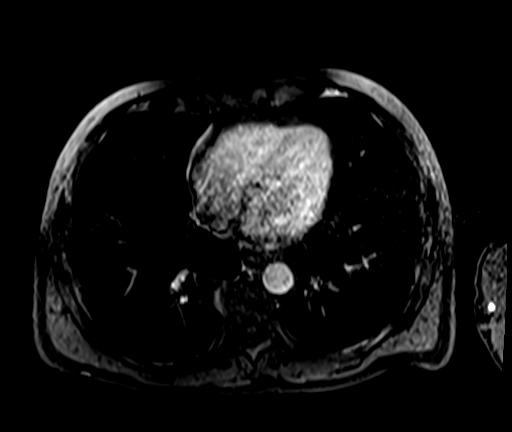

[Series 12: T1 dynamic post-contrast · axial · 2.5mm · 0.74mm/px · z∈[-128,+89]mm · 3 of 88 slices shown (2 of 4)]
[im 1/88]
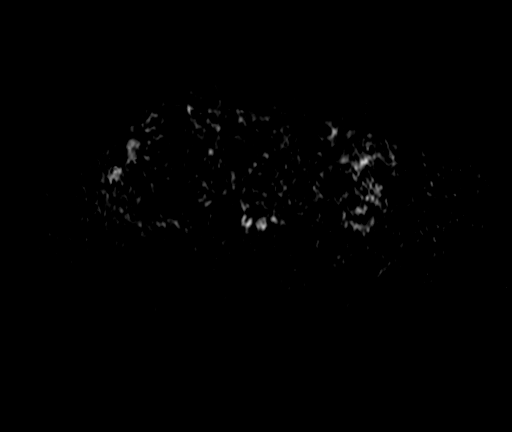
[im 44/88]
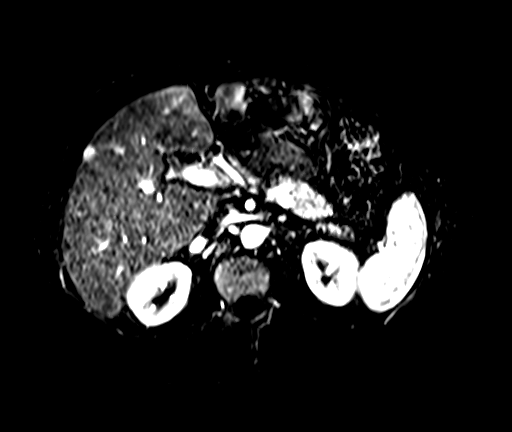
[im 88/88]
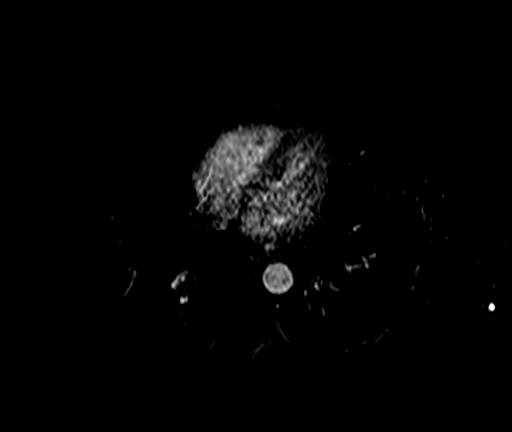

[Series 13: T1 dynamic post-contrast · axial · 2.5mm · 0.74mm/px · z∈[-128,+89]mm · 3 of 88 slices shown (3 of 4)]
[im 1/88]
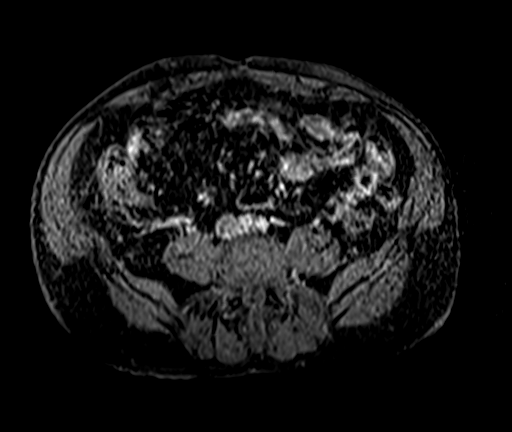
[im 44/88]
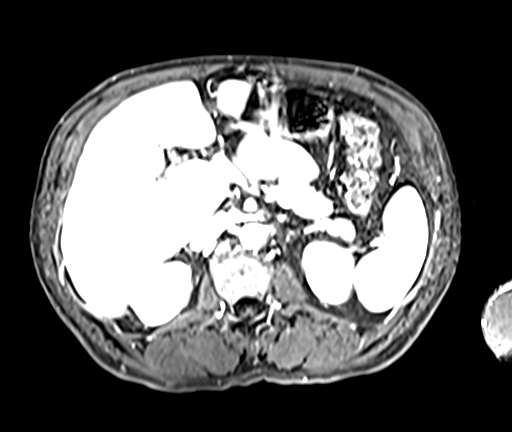
[im 88/88]
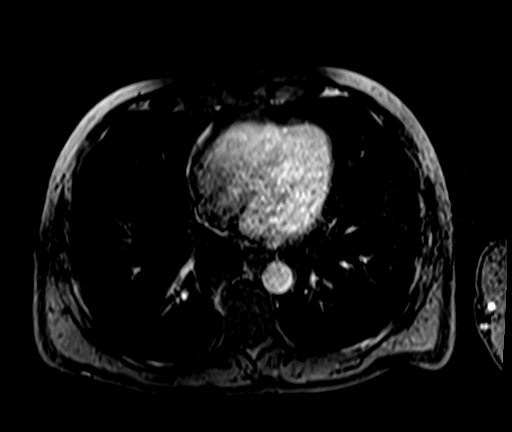

[Series 14: T1 dynamic post-contrast · axial · 2.5mm · 0.74mm/px · z∈[-128,-21]mm · 2 of 88 slices shown (4 of 4)]
[im 1/88]
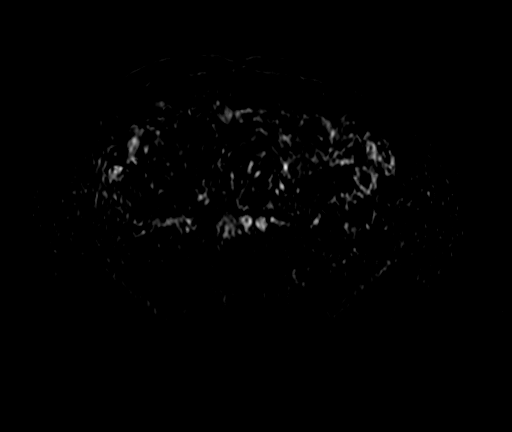
[im 44/88]
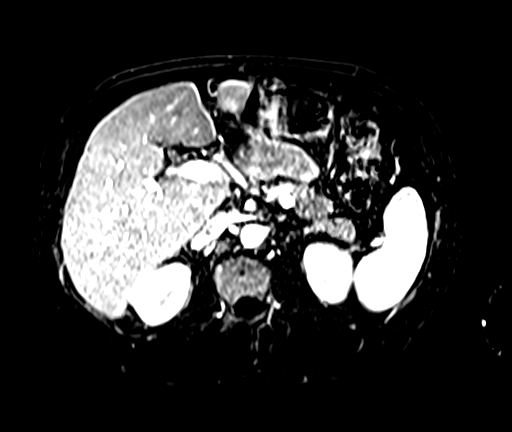

[26 of 48 positions shown; findings below may reference images not displayed]

FINDINGS: Lower chest: No acute findings.

Hepatobiliary: Hepatic cirrhosis again demonstrated. There has been
mild decrease in size of the ablation defect in segment 2 of the
left lobe, currently measuring 2.5 x 2.2 cm on image [DATE], compared
to 2.9 x 2.9 cm previously. A small ill-defined, somewhat nodular
area of contrast enhancement is now seen along the inferior wall of
this defect (e.g. Image 29/18), which could represent residual
carcinoma.

A new hypervascular mass is seen in the lateral aspect of segment 5
of the right lobe, which shows homogeneous arterial phase
hyperenhancement, contrast washout, and delayed peripheral rim
enhancement. This lesion measures 11 x 9 mm on image 47/18.
Gallbladder is unremarkable. No evidence of biliary ductal
dilatation.

Pancreas:  No mass or inflammatory changes.

Spleen:  Within normal limits in size and appearance.

Adrenals/Urinary Tract: No masses identified. No evidence of
hydronephrosis.

Stomach/Bowel: Unremarkable.

Vascular/Lymphatic: No pathologically enlarged lymph nodes
identified. No acute vascular findings.

Other:  None.

Musculoskeletal:  No suspicious bone lesions identified.
IMPRESSION: Cirrhosis.

Mild decrease in size of the ablation defect in segment 2 of the
left hepatic lobe. A small somewhat nodular area of contrast
enhancement along the inferior wall of this defect, which could
represent residual carcinoma. (ERASMIA response category: LR-TR
Equivocal)

New 11 mm hypervascular mass in the segment 5 of the right hepatic
lobe, which meets definitive criteria for hepatocellular carcinoma.
(ERASMIA Category 5: Definitely HCC)

No evidence of abdominal metastatic disease.

## 2021-10-11 MED ORDER — GADOBENATE DIMEGLUMINE 529 MG/ML IV SOLN
14.0000 mL | Freq: Once | INTRAVENOUS | Status: AC | PRN
  Administered 2021-10-11: 14 mL via INTRAVENOUS

## 2021-10-26 ENCOUNTER — Other Ambulatory Visit: Payer: Self-pay | Admitting: Interventional Radiology

## 2021-10-26 DIAGNOSIS — C220 Liver cell carcinoma: Secondary | ICD-10-CM

## 2021-10-29 ENCOUNTER — Other Ambulatory Visit: Payer: Self-pay | Admitting: Interventional Radiology

## 2021-10-29 DIAGNOSIS — C220 Liver cell carcinoma: Secondary | ICD-10-CM

## 2021-11-12 ENCOUNTER — Encounter: Payer: Self-pay | Admitting: *Deleted

## 2021-11-12 ENCOUNTER — Ambulatory Visit
Admission: RE | Admit: 2021-11-12 | Discharge: 2021-11-12 | Disposition: A | Discharge status: Home / Self Care | Payer: Medicare HMO | Source: Ambulatory Visit | Attending: Interventional Radiology | Admitting: Interventional Radiology

## 2021-11-12 DIAGNOSIS — C220 Liver cell carcinoma: Secondary | ICD-10-CM | POA: Diagnosis not present

## 2021-11-12 HISTORY — PX: IR RADIOLOGIST EVAL & MGMT: IMG5224

## 2021-11-12 NOTE — Progress Notes (Signed)
Chief Complaint: Patient was consulted remotely today (TeleHealth) for follow-up after thermal ablation of hepatocellular carcinoma.  History of Present Illness: Levi Conner is a 66 y.o. male status post percutaneous microwave thermal ablation of a 3 cm segment II hepatocellular carcinoma within the left lobe of the liver on 01/24/2021.  He has done well after the procedure and denies any current symptoms.   He underwent 12 weeks of treatment with Epclusa for hepatitis C after ablation. He had a rise in liver enzymes after treatment concerning for relapse of HCV and also continues to drink alcohol. He had a follow up MRI of the abdomen on 10/11/21.   Past Medical History:  Diagnosis Date   Arthritis    Borderline diabetic    diet controlled, pt does not check cbg   Colon polyps    Diabetes mellitus without complication (HCC)    no meds   Hemorrhoids    Hepatitis C    tumor on liver treated befor can treat Hep C   Hernia, inguinal, bilateral    current   Hypertension    Melanoma ([place])    Pneumonia 10/23/2011   HOSPITALIZED FOR PNEUMONIA MAY 2013.  PT HAS PULMONARY CLEARANCE FROM DR. WERT FOR HERNIA REPAIR WITH DR. Johney Maine   Rash  since 7-8-201   red rash around bellybutton and belt line and both feet  - resolved    Past Surgical History:  Procedure Laterality Date   ESOPHAGOGASTRODUODENOSCOPY  03/26/2012   Procedure: ESOPHAGOGASTRODUODENOSCOPY (EGD);  Surgeon: Beryle Beams, MD;  Location: [facility] ENDOSCOPY;  Service: Endoscopy;  Laterality: N/A;   HERNIA REPAIR  01/30/2012   umb hernia   INGUINAL HERNIA REPAIR  01/30/2012   Procedure: LAPAROSCOPIC BILATERAL INGUINAL HERNIA REPAIR;  Surgeon: Adin Hector, MD;  Location: [facility];  Service: General;  Laterality: Bilateral;   IR RADIOLOGIST EVAL & MGMT  12/26/2020   IR RADIOLOGIST EVAL & MGMT  02/13/2021   IR RADIOLOGIST EVAL & MGMT  05/31/2021   RADIOLOGY WITH ANESTHESIA N/A 01/24/2021   Procedure: CT WITH ANESTHESIA-THERMAL  ABLATION;  Surgeon: Aletta Edouard, MD;  Location: [facility];  Service: Radiology;  Laterality: N/A;   UMBILICAL HERNIA REPAIR  01/30/2012   Procedure: HERNIA REPAIR UMBILICAL ADULT;  Surgeon: Adin Hector, MD;  Location: [facility];  Service: General;  Laterality: N/A;  Primary Umbilical Hernia Repair    Allergies: Penicillins and Sulfa antibiotics  Medications: Prior to Admission medications   Medication Sig Start Date End Date Taking? Authorizing Provider  acetaminophen (TYLENOL) 650 MG CR tablet Take 650 mg by mouth every 8 (eight) hours as needed for pain.    [provider]  hydrocortisone 2.5 % cream Apply 1 application topically daily. 04/05/17   [provider]  losartan (COZAAR) 50 MG tablet Take 50 mg by mouth daily.    [provider]  tetrahydrozoline-zinc (VISINE-AC) 0.05-0.25 % ophthalmic solution Place 2 drops into both eyes daily as needed (dry eyes).    [provider]     No family history on file.  Social History   Socioeconomic History   Marital status: Single    Spouse name: Not on file   Number of children: Not on file   Years of education: Not on file   Highest education level: Not on file  Occupational History   Occupation: Architect  Tobacco Use   Smoking status: Some Days    Packs/day: 0.50    Years: 30.00  Pack years: 15.00    Types: Cigarettes   Smokeless tobacco: Never  Vaping Use   Vaping Use: Never used  Substance and Sexual Activity   Alcohol use: Not Currently    Alcohol/week: 3.0 standard drinks    Types: 3 Cans of beer per week    Comment: quit for 45 days   Drug use: No   Sexual activity: Not Currently  Other Topics Concern   Not on file  Social History Narrative   Is concerned about a significant mold problem in his friend's home where he is living.    Social Determinants of Health   Financial Resource Strain: Not on file  Food Insecurity: Not on file  Transportation Needs: Not on file   Physical Activity: Not on file  Stress: Not on file  Social Connections: Not on file    ECOG Status: 0 - Asymptomatic  Review of Systems  Constitutional: Negative.   Respiratory: Negative.    Cardiovascular: Negative.   Gastrointestinal: Negative.   Genitourinary: Negative.   Musculoskeletal: Negative.   Neurological: Negative.    Review of Systems: A 12 point ROS discussed and pertinent positives are indicated in the HPI above.  All other systems are negative.  Physical Exam No direct physical exam was performed (except for noted visual exam findings with Video Visits).   Vital Signs: There were no vitals taken for this visit.  Imaging: No results found.  Labs:  CBC: Recent Labs    01/12/21 1441 01/24/21 1030 01/25/21 0545  WBC 4.0 5.9 8.3  HGB 14.8 15.5 14.4  HCT 41.9 42.9 40.6  PLT 111* 133* 117*    COAGS: Recent Labs    01/12/21 1441  INR 1.2  APTT 33    BMP: Recent Labs    01/12/21 1441 01/25/21 0545  NA 137 131*  K 4.3 4.2  CL 104 101  CO2 27 21*  GLUCOSE 154* 279*  BUN 22 22  CALCIUM 9.6 9.1  CREATININE 1.10 1.15  GFRNONAA >60 >60    LIVER FUNCTION TESTS: Recent Labs    01/12/21 1441  BILITOT 0.9  AST 52*  ALT 44  ALKPHOS 94  PROT 7.3  ALBUMIN 3.5     Assessment and Plan:  I spoke with Mr. Kilmer over the phone and reviewed MRI results with him. The recent MRI demonstrates a new 9 x 11 mm subcapsular area of arterial hyperenhancement in the right lobe of the liver (7 x 10 mm by my measurement) suspicious for a new focus of early [place]. Area of potential nodular enhancement along the inferior aspect of the previous segment II ablation defect in the left lobe may be related to the perfusion defect in the adjacent left lobe after ablation, and not definite recurrence based on my review.  I recommended we evaluate both areas by contrast-enhanced ultrasound. If the new subcapsular lesion in the right lobe is visible by [place] and  meets CEUS criteria for [facility], it should be amenable to percutaneous thermal ablation. The left lobe ablation defect will also be interrogated by [place] to determine if there is any suspicious enhancement by CEUS. We will schedule the ultrasound at [facility] on a day that I can be present for the exam.   Electronically Signed: Azzie Roup 11/12/2021, 11:13 AM    I spent a total of  10 Minutes in remote  clinical consultation, greater than 50% of which was counseling/coordinating care for hepatocellular carcinoma.    Visit type: Audio only (  telephone). Audio (no video) only due to patient's lack of internet/smartphone capability. Alternative for in-person consultation at [facility], [place] [address], [place], [place]. This visit type was conducted due to national recommendations for restrictions regarding the COVID-19 Pandemic (e.g. social distancing).  This format is felt to be most appropriate for this patient at this time.  All issues noted in this document were discussed and addressed.

## 2021-11-13 ENCOUNTER — Other Ambulatory Visit (HOSPITAL_COMMUNITY): Payer: Self-pay | Admitting: Interventional Radiology

## 2021-11-13 DIAGNOSIS — C220 Liver cell carcinoma: Secondary | ICD-10-CM

## 2021-11-26 ENCOUNTER — Telehealth: Payer: Medicare HMO

## 2021-11-29 ENCOUNTER — Other Ambulatory Visit: Payer: Self-pay | Admitting: Internal Medicine

## 2021-11-29 ENCOUNTER — Other Ambulatory Visit (HOSPITAL_COMMUNITY): Payer: Self-pay | Admitting: Interventional Radiology

## 2021-11-29 ENCOUNTER — Ambulatory Visit (HOSPITAL_COMMUNITY)
Admission: RE | Admit: 2021-11-29 | Discharge: 2021-11-29 | Disposition: A | Discharge status: Home / Self Care | Payer: Medicare HMO | Source: Ambulatory Visit | Attending: Interventional Radiology | Admitting: Interventional Radiology

## 2021-11-29 DIAGNOSIS — K769 Liver disease, unspecified: Secondary | ICD-10-CM | POA: Diagnosis not present

## 2021-11-29 DIAGNOSIS — C220 Liver cell carcinoma: Secondary | ICD-10-CM | POA: Diagnosis not present

## 2021-11-29 IMAGING — US US LIVER W/CM 1ST LESION
1 series · 14 of 25 positions shown · IV contrast (agent unspecified)
Comparison: MRI of the abdomen on [DATE]

CLINICAL DATA: History of hepatocellular carcinoma and status post
thermal ablation of 3 cm segment II left lobe hepatocellular
carcinoma on [DATE]. Recent MRI has suggested somewhat nodular
area of contrast enhancement along the inferior aspect of the
ablation defect as well as a new subcapsular lesion demonstrating
arterial enhancement within the lateral aspect segment V measuring 9
x 11 mm. Additional contrast enhanced ultrasound characterization is
performed prior to potential treatment of the liver.

EXAM:
1. LIMITED ABDOMINAL ULTRASOUND OF THE LIVER
2. CONTRAST ENHANCED ULTRASOUND OF LIVER LESION IN THE RIGHT LOBE
3. CONTRAST ENHANCED ULTRASOUND OF THE LIVER IN THE LEFT LOBE
CONTRAST:  Lumason, 5 mL
Number of injections: 2

[Series 1: us liver w/cm 1st lesion · 46 acquisitions, 14 frames shown]
[im 1/46]
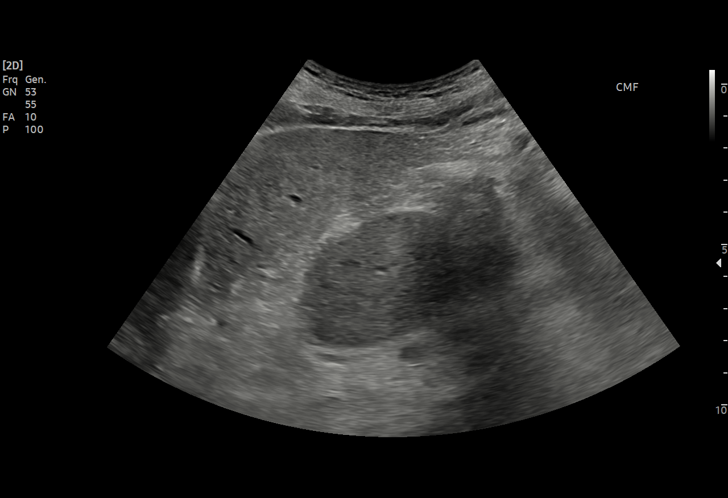
[im 4/46]
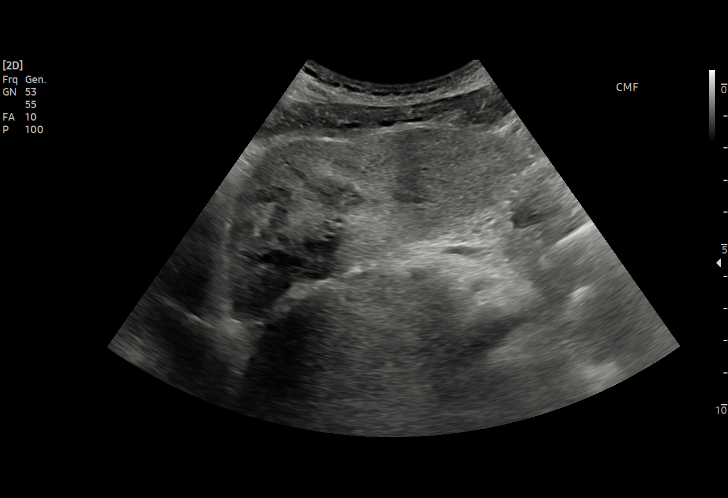
[im 8/46]
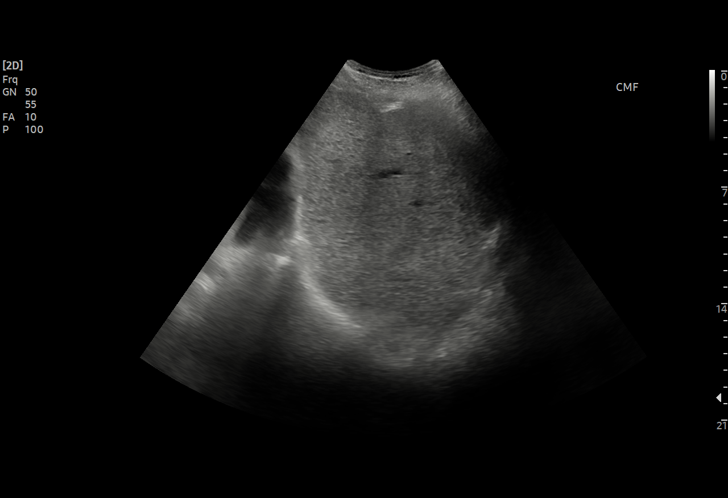
[im 12/46]
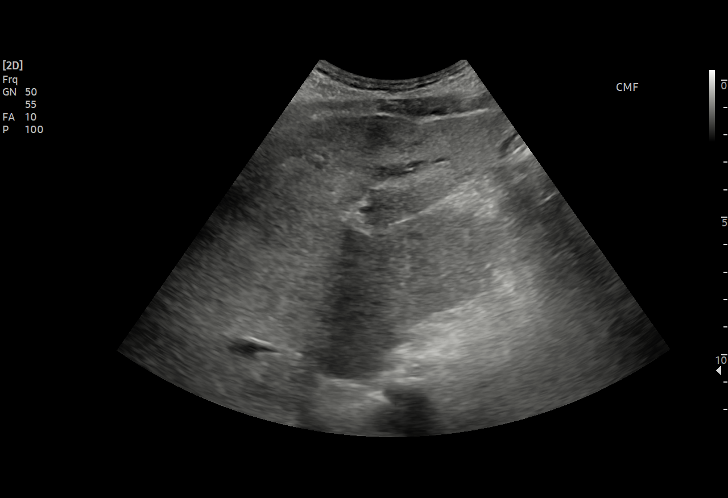
[im 16/46]
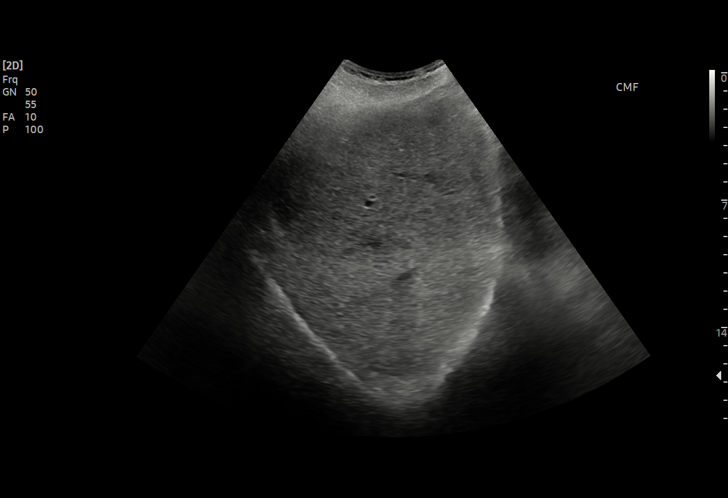
[im 17/46]
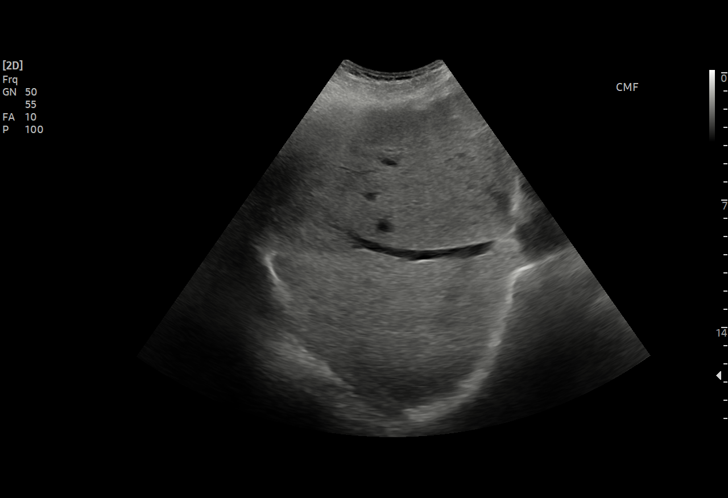
[im 21/46]
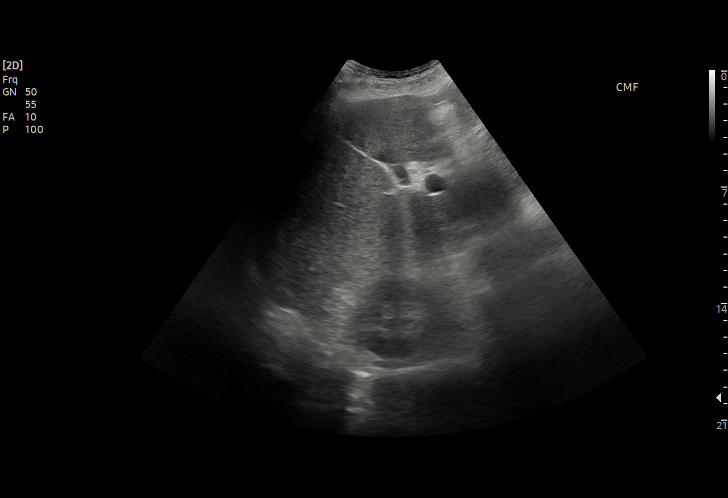
[im 25/46]
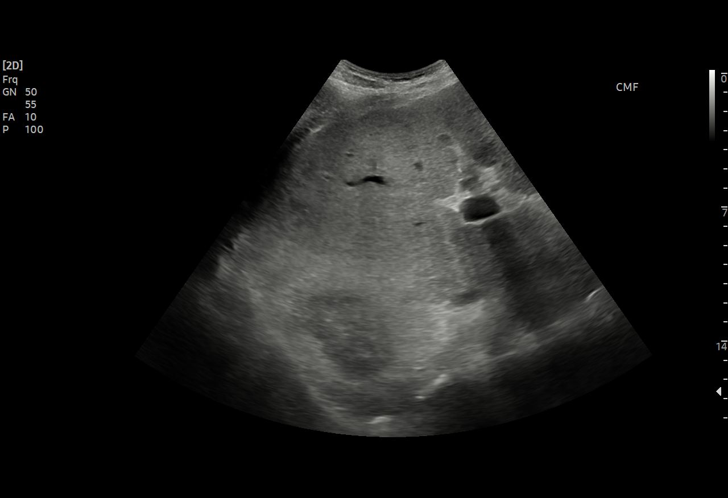
[im 29/46]
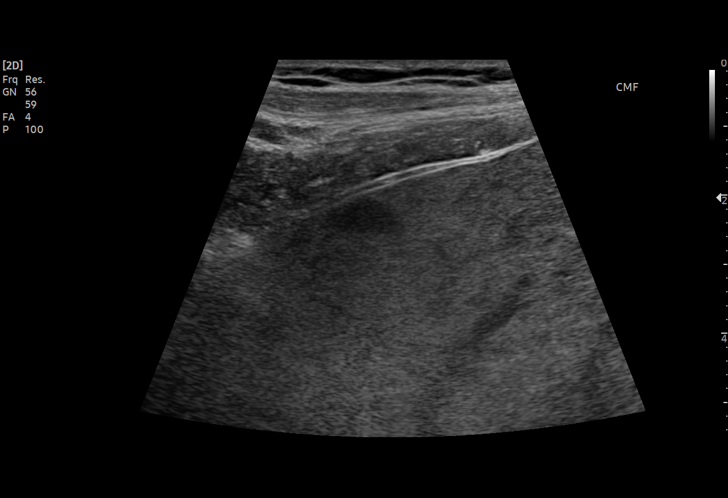
[im 31/46]
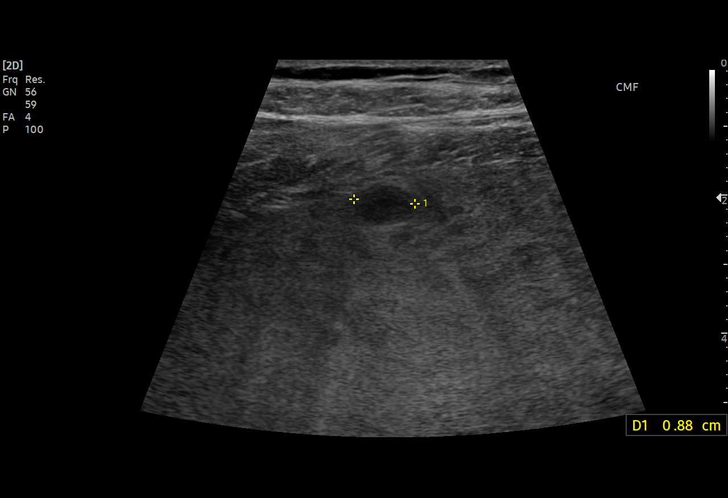
[im 34/46]
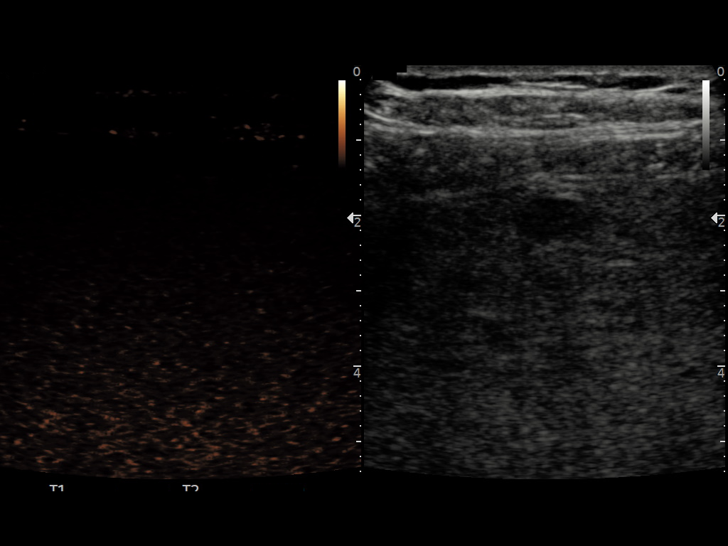
[im 38/46]
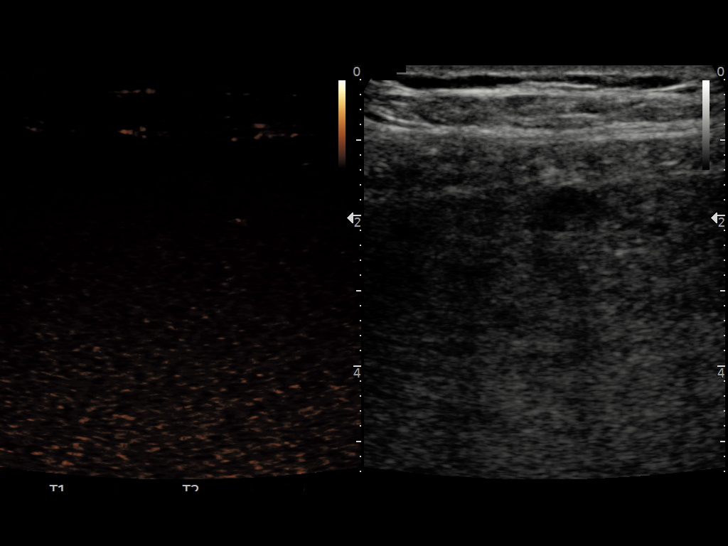
[im 42/46]
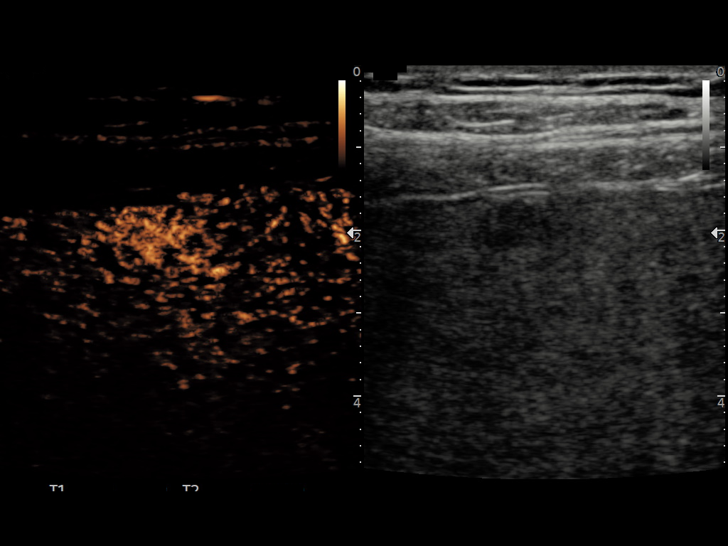
[im 46/46]
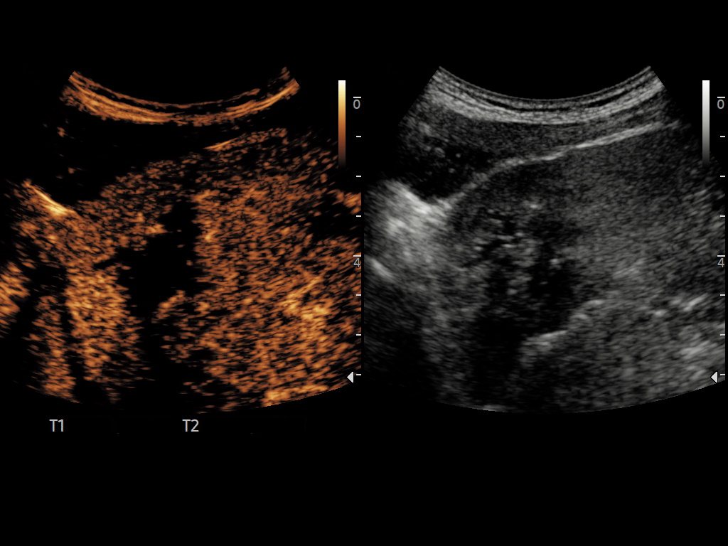

[14 of 25 positions shown; findings below may reference images not displayed]

FINDINGS: LIVER

Size: Normal.

Echogenicity: Increased.

Surface Contour: Nodular.

Observation 1

Lobe: Right

Size: 1.2 x 0.6 x 0.9 cm

Arterial phase enhancement features: Diffuse

Onset of washout: 26 seconds

Degree of washout: Marked, early

Tumor in vein: No

MAHABIR category: MAHABIR5

Observation 2

Lobe: Left

Size: 3.0 cm

Arterial phase enhancement features: None

Onset of washout: None

Degree of washout: None

Tumor in vein: None

MAHABIR category: MAHABIR1

BILIARY SYSTEM

Gallbladder: Normal

Intrahepatic bile ducts: Normal

Common bile duct: Normal

FLUID

No ascites.

VASCULATURE

Main portal vein: Patent with antegrade flow.

Right portal vein: Patent with antegrade flow.

Left portal vein: Patent with antegrade flow.

Hepatic veins: Patent.

Echogenicity: Increased.

Surface Contour: Mildly nodular.
IMPRESSION: 1. 12 mm subcapsular lesion in the peripheral right lobe of the
liver (segment V) is consistent with a small hepatocellular
carcinoma and is categorized as MAHABIR5 lesion.
2. There is no evidence of abnormal enhancement by contrast enhanced
ultrasound surrounding the area of prior ablation within the left
lobe of the liver to suggest recurrence previously ablated
hepatocellular carcinoma.

## 2021-11-29 MED ORDER — SULFUR HEXAFLUORIDE MICROSPH 60.7-25 MG IJ SUSR
INTRAMUSCULAR | Status: AC
  Administered 2021-11-29: 2.4 mL
  Filled 2021-11-29: qty 5

## 2021-11-29 MED ORDER — SULFUR HEXAFLUORIDE MICROSPH 60.7-25 MG IJ SUSR
INTRAMUSCULAR | Status: AC
  Administered 2021-11-29: 5 mL
  Filled 2021-11-29: qty 5

## 2021-12-10 ENCOUNTER — Other Ambulatory Visit (HOSPITAL_COMMUNITY): Payer: Self-pay | Admitting: Interventional Radiology

## 2021-12-10 DIAGNOSIS — C220 Liver cell carcinoma: Secondary | ICD-10-CM

## 2021-12-20 NOTE — Progress Notes (Signed)
Let message on the IR PA line requesting preop orders for surgery 01/02/2022 for preop on 12/27/2021

## 2021-12-21 ENCOUNTER — Other Ambulatory Visit: Payer: Self-pay | Admitting: Radiology

## 2021-12-21 DIAGNOSIS — C220 Liver cell carcinoma: Secondary | ICD-10-CM

## 2021-12-24 NOTE — Progress Notes (Signed)
COVID Vaccine Completed:  Yes  Date of COVID positive in last 90 days:  PCP - Roosevelt Locks, CRNP Cardiologist -  Pulmonologist - Christinia Gully, MD (last OV 2013 for pneumonia)  Chest x-ray - 1V 01-24-21 Epic EKG - 01-12-21 Epic Stress Test -  ECHO -  Cardiac Cath -  Pacemaker/ICD device last checked: Spinal Cord Stimulator:  Bowel Prep -   Sleep Study -  CPAP -   Fasting Blood Sugar -  Checks Blood Sugar _____ times a day  Blood Thinner Instructions: Aspirin Instructions: Last Dose:  Activity level:  Can go up a flight of stairs and perform activities of daily living without stopping and without symptoms of chest pain or shortness of breath.  Able to exercise without symptoms  Unable to go up a flight of stairs without symptoms of     Anesthesia review:   Patient denies shortness of breath, fever, cough and chest pain at PAT appointment  Patient verbalized understanding of instructions that were given to them at the PAT appointment. Patient was also instructed that they will need to review over the PAT instructions again at home before surgery.

## 2021-12-24 NOTE — Patient Instructions (Signed)
DUE TO COVID-19 ONLY TWO VISITORS  (aged 65 and older)  IS ALLOWED TO COME WITH YOU AND STAY IN THE WAITING ROOM ONLY DURING PRE OP AND PROCEDURE.   **NO VISITORS ARE ALLOWED IN THE SHORT STAY AREA OR RECOVERY ROOM!!**  IF YOU WILL BE ADMITTED INTO THE HOSPITAL YOU ARE ALLOWED ONLY FOUR SUPPORT PEOPLE DURING VISITATION HOURS ONLY (7 AM -8PM)   The support person(s) must pass our screening, gel in and out Visitors GUEST BADGE MUST BE WORN VISIBLY  One adult visitor may remain with you overnight and MUST be in the room by 8 P.M.   You are not required to [employer] often Do NOT share personal items Notify your provider if you are in close contact with someone who has COVID or you develop fever 100.4 or greater, new onset of sneezing, cough, sore throat, shortness of breath or body aches.        Your procedure is scheduled on:  01-02-22   Report to [facility] Main Entrance    Report to admitting at 9:45 AM   Call this number if you have problems the morning of surgery [phone]   Do not eat food or drink liquids:After Midnight the night before surgery           If you have questions, please contact your surgeon's office.   FOLLOW ANY ADDITIONAL PRE OP INSTRUCTIONS YOU RECEIVED FROM YOUR SURGEON'S OFFICE!!!     Oral Hygiene is also important to reduce your risk of infection.                                    Remember - BRUSH YOUR TEETH THE MORNING OF SURGERY WITH YOUR REGULAR TOOTHPASTE   Do NOT smoke after Midnight   Take these medicines the morning of surgery with A SIP OF WATER:  Amlodipine, Tylenol if needed   DO NOT [place]. PHARMACY WILL DISPENSE MEDICATIONS LISTED ON YOUR MEDICATION LIST TO YOU DURING YOUR ADMISSION Levi Conner!  DO NOT TAKE ANY ORAL DIABETIC MEDICATIONS DAY OF YOUR SURGERY  Bring CPAP mask and tubing day of surgery.                              You may not have any metal on your body  including  jewelry, and body piercing             Do not wear lotions, powders, cologne, or deodorant              Men may shave face and neck.    Contacts, dentures or bridgework may not be worn into surgery.   Bring small overnight bag day of surgery.  Do not bring valuables to the hospital. [place].   Special Instructions: Bring a copy of your healthcare power of attorney and living will documents the day of surgery if you haven't scanned them before.  Please read over the following fact sheets you were given: IF [place] or [phone] (7/10 - 7/14)  [place] - Preparing for Surgery Before surgery, you can play an important role.  Because skin is not sterile, your skin needs to be as free of germs as possible.  You can reduce  the number of germs on your skin by washing with CHG (chlorahexidine gluconate) soap before surgery.  CHG is an antiseptic cleaner which kills germs and bonds with the skin to continue killing germs even after washing. Please DO NOT use if you have an allergy to CHG or antibacterial soaps.  If your skin becomes reddened/irritated stop using the CHG and inform your nurse when you arrive at Short Stay. Do not shave (including legs and underarms) for at least 48 hours prior to the first CHG shower.  You may shave your face/neck.  Please follow these instructions carefully:  1.  Shower with CHG Soap the night before surgery and the  morning of surgery.  2.  If you choose to wash your hair, wash your hair first as usual with your normal  shampoo.  3.  After you shampoo, rinse your hair and body thoroughly to remove the shampoo.                             4.  Use CHG as you would any other liquid soap.  You can apply chg directly to the skin and wash.  Gently with a scrungie or clean washcloth.  5.  Apply the CHG Soap to your body ONLY FROM THE NECK DOWN.   Do    not use on face/ open                           Wound or open sores. Avoid contact with eyes, ears mouth and   genitals (private parts).                       Wash face,  Genitals (private parts) with your normal soap.             6.  Wash thoroughly, paying special attention to the area where your    surgery  will be performed.  7.  Thoroughly rinse your body with warm water from the neck down.  8.  DO NOT shower/wash with your normal soap after using and rinsing off the CHG Soap.                9.  Pat yourself dry with a clean towel.            10.  Wear clean pajamas.            11.  Place clean sheets on your bed the night of your first shower and do not  sleep with pets. Day of Surgery : Do not apply any lotions/deodorants the morning of surgery.  Please wear clean clothes to the hospital/surgery center.  FAILURE TO FOLLOW THESE INSTRUCTIONS MAY RESULT IN THE CANCELLATION OF YOUR SURGERY  PATIENT SIGNATURE_________________________________  NURSE SIGNATURE__________________________________  ________________________________________________________________________

## 2021-12-27 ENCOUNTER — Encounter (HOSPITAL_COMMUNITY)
Admission: RE | Admit: 2021-12-27 | Discharge: 2021-12-27 | Disposition: A | Discharge status: Home / Self Care | Payer: Medicare HMO | Source: Ambulatory Visit | Attending: Interventional Radiology | Admitting: Interventional Radiology

## 2021-12-27 ENCOUNTER — Ambulatory Visit (HOSPITAL_COMMUNITY)
Admission: RE | Admit: 2021-12-27 | Discharge: 2021-12-27 | Disposition: A | Discharge status: Home / Self Care | Payer: Medicare HMO | Source: Ambulatory Visit | Attending: Radiology | Admitting: Radiology

## 2021-12-27 ENCOUNTER — Encounter (HOSPITAL_COMMUNITY): Payer: Self-pay

## 2021-12-27 ENCOUNTER — Other Ambulatory Visit: Payer: Self-pay

## 2021-12-27 VITALS — BP 137/83 | HR 75 | Temp 98.2°F | Resp 18 | Ht 66.0 in | Wt 141.0 lb

## 2021-12-27 DIAGNOSIS — R7303 Prediabetes: Secondary | ICD-10-CM | POA: Diagnosis not present

## 2021-12-27 DIAGNOSIS — C220 Liver cell carcinoma: Secondary | ICD-10-CM | POA: Insufficient documentation

## 2021-12-27 DIAGNOSIS — Z01818 Encounter for other preprocedural examination: Secondary | ICD-10-CM | POA: Insufficient documentation

## 2021-12-27 DIAGNOSIS — R053 Chronic cough: Secondary | ICD-10-CM | POA: Diagnosis not present

## 2021-12-27 HISTORY — DX: Prediabetes: R73.03

## 2021-12-27 LAB — CBC WITH DIFFERENTIAL/PLATELET
Abs Immature Granulocytes: 0.01 10*3/uL (ref 0.00–0.07)
Basophils Absolute: 0.1 10*3/uL (ref 0.0–0.1)
Basophils Relative: 2 %
Eosinophils Absolute: 0.3 10*3/uL (ref 0.0–0.5)
Eosinophils Relative: 6 %
HCT: 45.5 % (ref 39.0–52.0)
Hemoglobin: 16.5 g/dL (ref 13.0–17.0)
Immature Granulocytes: 0 %
Lymphocytes Relative: 37 %
Lymphs Abs: 1.8 10*3/uL (ref 0.7–4.0)
MCH: 35.6 pg — ABNORMAL HIGH (ref 26.0–34.0)
MCHC: 36.3 g/dL — ABNORMAL HIGH (ref 30.0–36.0)
MCV: 98.3 fL (ref 80.0–100.0)
Monocytes Absolute: 0.5 10*3/uL (ref 0.1–1.0)
Monocytes Relative: 10 %
Neutro Abs: 2.2 10*3/uL (ref 1.7–7.7)
Neutrophils Relative %: 45 %
Platelets: 112 10*3/uL — ABNORMAL LOW (ref 150–400)
RBC: 4.63 MIL/uL (ref 4.22–5.81)
RDW: 12.5 % (ref 11.5–15.5)
WBC: 4.8 10*3/uL (ref 4.0–10.5)
nRBC: 0 % (ref 0.0–0.2)

## 2021-12-27 LAB — HEMOGLOBIN A1C
Hgb A1c MFr Bld: 6 % — ABNORMAL HIGH (ref 4.8–5.6)
Mean Plasma Glucose: 125.5 mg/dL

## 2021-12-27 LAB — COMPREHENSIVE METABOLIC PANEL
ALT: 97 U/L — ABNORMAL HIGH (ref 0–44)
AST: 114 U/L — ABNORMAL HIGH (ref 15–41)
Albumin: 3.9 g/dL (ref 3.5–5.0)
Alkaline Phosphatase: 111 U/L (ref 38–126)
Anion gap: 8 (ref 5–15)
BUN: 30 mg/dL — ABNORMAL HIGH (ref 8–23)
CO2: 25 mmol/L (ref 22–32)
Calcium: 10 mg/dL (ref 8.9–10.3)
Chloride: 104 mmol/L (ref 98–111)
Creatinine, Ser: 1.64 mg/dL — ABNORMAL HIGH (ref 0.61–1.24)
GFR, Estimated: 46 mL/min — ABNORMAL LOW (ref >60)
Glucose, Bld: 188 mg/dL — ABNORMAL HIGH (ref 70–99)
Potassium: 4.4 mmol/L (ref 3.5–5.1)
Sodium: 137 mmol/L (ref 135–145)
Total Bilirubin: 1.3 mg/dL — ABNORMAL HIGH (ref 0.3–1.2)
Total Protein: 8.3 g/dL — ABNORMAL HIGH (ref 6.5–8.1)

## 2021-12-27 LAB — PROTIME-INR
INR: 1 (ref 0.8–1.2)
Prothrombin Time: 13.5 seconds (ref 11.4–15.2)

## 2021-12-27 LAB — GLUCOSE, CAPILLARY: Glucose-Capillary: 191 mg/dL — ABNORMAL HIGH (ref 70–99)

## 2022-01-01 ENCOUNTER — Other Ambulatory Visit: Payer: Self-pay | Admitting: Radiology

## 2022-01-01 DIAGNOSIS — C220 Liver cell carcinoma: Secondary | ICD-10-CM

## 2022-01-02 ENCOUNTER — Encounter (HOSPITAL_COMMUNITY): Payer: Self-pay

## 2022-01-02 ENCOUNTER — Ambulatory Visit (HOSPITAL_COMMUNITY): Payer: Medicare HMO | Admitting: Physician Assistant

## 2022-01-02 ENCOUNTER — Observation Stay (HOSPITAL_COMMUNITY)
Admission: RE | Admit: 2022-01-02 | Discharge: 2022-01-03 | Disposition: A | Discharge status: Home / Self Care | Payer: Medicare HMO | Attending: Interventional Radiology | Admitting: Interventional Radiology

## 2022-01-02 ENCOUNTER — Observation Stay (HOSPITAL_COMMUNITY)
Admission: RE | Admit: 2022-01-02 | Discharge: 2022-01-02 | Disposition: A | Discharge status: Home / Self Care | Payer: Medicare HMO | Source: Ambulatory Visit | Attending: Interventional Radiology | Admitting: Interventional Radiology

## 2022-01-02 ENCOUNTER — Encounter (HOSPITAL_COMMUNITY)
Admission: RE | Disposition: A | Discharge status: Home / Self Care | Payer: Self-pay | Source: Home / Self Care | Attending: Interventional Radiology

## 2022-01-02 ENCOUNTER — Encounter (HOSPITAL_COMMUNITY): Payer: Self-pay | Admitting: Interventional Radiology

## 2022-01-02 ENCOUNTER — Ambulatory Visit (HOSPITAL_BASED_OUTPATIENT_CLINIC_OR_DEPARTMENT_OTHER): Payer: Medicare HMO | Admitting: Anesthesiology

## 2022-01-02 ENCOUNTER — Other Ambulatory Visit: Payer: Self-pay

## 2022-01-02 DIAGNOSIS — Z79899 Other long term (current) drug therapy: Secondary | ICD-10-CM | POA: Diagnosis not present

## 2022-01-02 DIAGNOSIS — C220 Liver cell carcinoma: Secondary | ICD-10-CM

## 2022-01-02 DIAGNOSIS — E119 Type 2 diabetes mellitus without complications: Secondary | ICD-10-CM

## 2022-01-02 DIAGNOSIS — F17210 Nicotine dependence, cigarettes, uncomplicated: Secondary | ICD-10-CM | POA: Diagnosis not present

## 2022-01-02 DIAGNOSIS — I10 Essential (primary) hypertension: Secondary | ICD-10-CM

## 2022-01-02 DIAGNOSIS — F17200 Nicotine dependence, unspecified, uncomplicated: Secondary | ICD-10-CM | POA: Insufficient documentation

## 2022-01-02 DIAGNOSIS — R7303 Prediabetes: Secondary | ICD-10-CM

## 2022-01-02 HISTORY — PX: RADIOLOGY WITH ANESTHESIA: SHX6223

## 2022-01-02 LAB — COMPREHENSIVE METABOLIC PANEL
ALT: 91 U/L — ABNORMAL HIGH (ref 0–44)
AST: 95 U/L — ABNORMAL HIGH (ref 15–41)
Albumin: 3.8 g/dL (ref 3.5–5.0)
Alkaline Phosphatase: 103 U/L (ref 38–126)
Anion gap: 7 (ref 5–15)
BUN: 25 mg/dL — ABNORMAL HIGH (ref 8–23)
CO2: 24 mmol/L (ref 22–32)
Calcium: 9.7 mg/dL (ref 8.9–10.3)
Chloride: 111 mmol/L (ref 98–111)
Creatinine, Ser: 1.25 mg/dL — ABNORMAL HIGH (ref 0.61–1.24)
GFR, Estimated: >60 mL/min (ref >60)
Glucose, Bld: 157 mg/dL — ABNORMAL HIGH (ref 70–99)
Potassium: 4.1 mmol/L (ref 3.5–5.1)
Sodium: 142 mmol/L (ref 135–145)
Total Bilirubin: 1.2 mg/dL (ref 0.3–1.2)
Total Protein: 7.9 g/dL (ref 6.5–8.1)

## 2022-01-02 LAB — CBC WITH DIFFERENTIAL/PLATELET
Abs Immature Granulocytes: 0.03 10*3/uL (ref 0.00–0.07)
Basophils Absolute: 0.1 10*3/uL (ref 0.0–0.1)
Basophils Relative: 2 %
Eosinophils Absolute: 0.2 10*3/uL (ref 0.0–0.5)
Eosinophils Relative: 5 %
HCT: 42.4 % (ref 39.0–52.0)
Hemoglobin: 15.3 g/dL (ref 13.0–17.0)
Immature Granulocytes: 1 %
Lymphocytes Relative: 40 %
Lymphs Abs: 1.6 10*3/uL (ref 0.7–4.0)
MCH: 35.1 pg — ABNORMAL HIGH (ref 26.0–34.0)
MCHC: 36.1 g/dL — ABNORMAL HIGH (ref 30.0–36.0)
MCV: 97.2 fL (ref 80.0–100.0)
Monocytes Absolute: 0.5 10*3/uL (ref 0.1–1.0)
Monocytes Relative: 12 %
Neutro Abs: 1.7 10*3/uL (ref 1.7–7.7)
Neutrophils Relative %: 40 %
Platelets: 100 10*3/uL — ABNORMAL LOW (ref 150–400)
RBC: 4.36 MIL/uL (ref 4.22–5.81)
RDW: 12.2 % (ref 11.5–15.5)
WBC: 4.1 10*3/uL (ref 4.0–10.5)
nRBC: 0 % (ref 0.0–0.2)

## 2022-01-02 LAB — GLUCOSE, CAPILLARY
Glucose-Capillary: 156 mg/dL — ABNORMAL HIGH (ref 70–99)
Glucose-Capillary: 191 mg/dL — ABNORMAL HIGH (ref 70–99)

## 2022-01-02 SURGERY — RADIOLOGY WITH ANESTHESIA
Anesthesia: General

## 2022-01-02 MED ORDER — SUGAMMADEX SODIUM 200 MG/2ML IV SOLN
INTRAVENOUS | Status: DC | PRN
  Administered 2022-01-02: 200 mg via INTRAVENOUS

## 2022-01-02 MED ORDER — KETOROLAC TROMETHAMINE 30 MG/ML IJ SOLN
INTRAMUSCULAR | Status: DC | PRN
  Administered 2022-01-02: 15 mg via INTRAVENOUS

## 2022-01-02 MED ORDER — ONDANSETRON HCL 4 MG/2ML IJ SOLN: 4.0000 mg | Freq: Four times a day (QID) | INTRAMUSCULAR | Status: DC | PRN

## 2022-01-02 MED ORDER — ROCURONIUM BROMIDE 10 MG/ML (PF) SYRINGE
PREFILLED_SYRINGE | INTRAVENOUS | Status: DC | PRN
  Administered 2022-01-02: 60 mg via INTRAVENOUS

## 2022-01-02 MED ORDER — FENTANYL CITRATE PF 50 MCG/ML IJ SOSY
25.0000 ug | PREFILLED_SYRINGE | INTRAMUSCULAR | Status: DC | PRN
  Administered 2022-01-02 (×3): 50 ug via INTRAVENOUS

## 2022-01-02 MED ORDER — PHENYLEPHRINE HCL-NACL 20-0.9 MG/250ML-% IV SOLN
INTRAVENOUS | Status: DC | PRN
  Administered 2022-01-02: 40 ug/min via INTRAVENOUS

## 2022-01-02 MED ORDER — ORAL CARE MOUTH RINSE: 15.0000 mL | Freq: Once | OROMUCOSAL | Status: DC

## 2022-01-02 MED ORDER — LOSARTAN POTASSIUM 50 MG PO TABS
100.0000 mg | ORAL_TABLET | Freq: Every day | ORAL | Status: DC
  Administered 2022-01-03: 100 mg via ORAL
  Filled 2022-01-02: qty 2

## 2022-01-02 MED ORDER — SENNOSIDES-DOCUSATE SODIUM 8.6-50 MG PO TABS: 1.0000 | ORAL_TABLET | Freq: Every day | ORAL | Status: DC | PRN

## 2022-01-02 MED ORDER — HYDROCODONE-ACETAMINOPHEN 5-325 MG PO TABS
1.0000 | ORAL_TABLET | ORAL | Status: DC | PRN
  Administered 2022-01-02: 1 via ORAL
  Administered 2022-01-02 – 2022-01-03 (×3): 2 via ORAL
  Filled 2022-01-02 (×3): qty 2

## 2022-01-02 MED ORDER — PROPOFOL 10 MG/ML IV BOLUS
INTRAVENOUS | Status: DC | PRN
  Administered 2022-01-02: 170 mg via INTRAVENOUS

## 2022-01-02 MED ORDER — ACETAMINOPHEN 500 MG PO TABS: 1000.0000 mg | ORAL_TABLET | Freq: Once | ORAL | Status: DC

## 2022-01-02 MED ORDER — SULFACETAMIDE SODIUM-SULFUR 10-5 % EX CREA
1.0000 | TOPICAL_CREAM | Freq: Every day | CUTANEOUS | Status: DC
Start: 2022-01-03 — End: 2022-01-03

## 2022-01-02 MED ORDER — METRONIDAZOLE 500 MG/100ML IV SOLN
500.0000 mg | Freq: Once | INTRAVENOUS | Status: AC
  Administered 2022-01-02: 500 mg via INTRAVENOUS
  Filled 2022-01-02: qty 100

## 2022-01-02 MED ORDER — HYDROCODONE-ACETAMINOPHEN 5-325 MG PO TABS
ORAL_TABLET | ORAL | Status: AC
  Filled 2022-01-02: qty 1

## 2022-01-02 MED ORDER — AMLODIPINE BESYLATE 5 MG PO TABS
5.0000 mg | ORAL_TABLET | Freq: Every day | ORAL | Status: DC
  Administered 2022-01-03: 5 mg via ORAL
  Filled 2022-01-02: qty 1

## 2022-01-02 MED ORDER — ESMOLOL HCL 100 MG/10ML IV SOLN
INTRAVENOUS | Status: DC | PRN
  Administered 2022-01-02: 30 mg via INTRAVENOUS
  Administered 2022-01-02: 40 mg via INTRAVENOUS

## 2022-01-02 MED ORDER — LEVOFLOXACIN IN D5W 500 MG/100ML IV SOLN
500.0000 mg | Freq: Once | INTRAVENOUS | Status: AC
  Administered 2022-01-02: 500 mg via INTRAVENOUS
  Filled 2022-01-02: qty 100

## 2022-01-02 MED ORDER — ONDANSETRON HCL 4 MG/2ML IJ SOLN
INTRAMUSCULAR | Status: DC | PRN
  Administered 2022-01-02: 4 mg via INTRAVENOUS

## 2022-01-02 MED ORDER — FENTANYL CITRATE PF 50 MCG/ML IJ SOSY
PREFILLED_SYRINGE | INTRAMUSCULAR | Status: AC
  Filled 2022-01-02: qty 3

## 2022-01-02 MED ORDER — CHLORHEXIDINE GLUCONATE 0.12 % MT SOLN
15.0000 mL | Freq: Once | OROMUCOSAL | Status: DC
  Filled 2022-01-02: qty 15

## 2022-01-02 MED ORDER — LACTATED RINGERS IV SOLN: INTRAVENOUS | Status: DC

## 2022-01-02 MED ORDER — FENTANYL CITRATE (PF) 100 MCG/2ML IJ SOLN
INTRAMUSCULAR | Status: AC
  Filled 2022-01-02: qty 2

## 2022-01-02 MED ORDER — HYDROMORPHONE HCL 1 MG/ML IJ SOLN
1.0000 mg | INTRAMUSCULAR | Status: DC | PRN
  Administered 2022-01-02 – 2022-01-03 (×3): 1 mg via INTRAVENOUS
  Filled 2022-01-02 (×3): qty 1

## 2022-01-02 MED ORDER — LIDOCAINE 2% (20 MG/ML) 5 ML SYRINGE
INTRAMUSCULAR | Status: DC | PRN
  Administered 2022-01-02: 60 mg via INTRAVENOUS

## 2022-01-02 MED ORDER — ONDANSETRON HCL 4 MG/2ML IJ SOLN: 4.0000 mg | Freq: Once | INTRAMUSCULAR | Status: DC | PRN

## 2022-01-02 MED ORDER — SODIUM CHLORIDE 0.9 % IV SOLN: INTRAVENOUS | Status: DC

## 2022-01-02 MED ORDER — DOCUSATE SODIUM 100 MG PO CAPS
100.0000 mg | ORAL_CAPSULE | Freq: Two times a day (BID) | ORAL | Status: DC
  Administered 2022-01-02 – 2022-01-03 (×2): 100 mg via ORAL
  Filled 2022-01-02 (×2): qty 1

## 2022-01-02 MED ORDER — FENTANYL CITRATE (PF) 100 MCG/2ML IJ SOLN
INTRAMUSCULAR | Status: DC | PRN
  Administered 2022-01-02 (×2): 50 ug via INTRAVENOUS

## 2022-01-02 MED ORDER — DEXAMETHASONE SODIUM PHOSPHATE 10 MG/ML IJ SOLN
INTRAMUSCULAR | Status: DC | PRN
  Administered 2022-01-02: 4 mg via INTRAVENOUS

## 2022-01-02 NOTE — H&P (Signed)
Referring Physician(s): Drazek,D  Supervising Physician: Aletta Edouard  Patient Status:  [facility] OP TBA  Chief Complaint: Hepatocellular carcinoma   Subjective: Patient familiar to IR service from prior percutaneous microwave thermal ablation of a 3 cm segment 2 [place] within the left lobe of the liver on 01/24/2021.  He underwent 12 weeks of treatment with Epclusa for hepatitis C after ablation. He had a rise in liver enzymes after treatment concerning for relapse of HCV and also continues to drink alcohol. He had a follow up MRI of the abdomen on 10/11/21 which demonstrated a new 9 x 11 mm subcapsular area of arterial hyperenhancement in the right lobe of the liver suspicious for a new focus of early [place].  Contrast-enhanced ultrasound of the liver on 11/27/2021 revealed that the lesion in the peripheral right lobe of the liver segment V was consistent with a small HCC and categorized as a CEUS LR-5 lesion.There was no evidence of abnormal enhancement by contrast enhanced ultrasound surrounding the area of prior ablation within the left lobe of the liver to suggest recurrence previously ablated hepatocellular carcinoma.  Following discussions with Dr. Kathlene Cote he was deemed an appropriate candidate for image guided microwave ablation of the right liver lesion and presents today for the procedure.  He currently denies fever, headache, chest pain, worsening dyspnea, abdominal/back pain, nausea, vomiting or bleeding.  He does have occasional cough and continues to smoke.  Additional medical history as below.  Past Medical History:  Diagnosis Date   Arthritis    Borderline diabetic    diet controlled, pt does not check cbg   Colon polyps    Diabetes mellitus without complication (HCC)    no meds   Hemorrhoids    Hepatitis C    tumor on liver treated befor can treat Hep C   Hernia, inguinal, bilateral    current   Hypertension    Melanoma ([place])    Pneumonia 10/23/2011   HOSPITALIZED FOR  PNEUMONIA MAY 2013.  PT HAS PULMONARY CLEARANCE FROM DR. WERT FOR HERNIA REPAIR WITH DR. Johney Maine   Pre-diabetes    Rash  since 7-8-201   red rash around bellybutton and belt line and both feet  - resolved   Past Surgical History:  Procedure Laterality Date   ESOPHAGOGASTRODUODENOSCOPY  03/26/2012   Procedure: ESOPHAGOGASTRODUODENOSCOPY (EGD);  Surgeon: Beryle Beams, MD;  [facility]: [facility] ENDOSCOPY;  Service: Endoscopy;  Laterality: N/A;   HERNIA REPAIR  01/30/2012   umb hernia   INGUINAL HERNIA REPAIR  01/30/2012   Procedure: LAPAROSCOPIC BILATERAL INGUINAL HERNIA REPAIR;  Surgeon: Adin Hector, MD;  Location: [facility] ORS;  Service: General;  Laterality: Bilateral;   IR RADIOLOGIST EVAL & MGMT  12/26/2020   IR RADIOLOGIST EVAL & MGMT  02/13/2021   IR RADIOLOGIST EVAL & MGMT  05/31/2021   IR RADIOLOGIST EVAL & MGMT  11/12/2021   RADIOLOGY WITH ANESTHESIA N/A 01/24/2021   Procedure: CT WITH ANESTHESIA-THERMAL ABLATION;  Surgeon: Aletta Edouard, MD;  Location: [facility] ORS;  Service: Radiology;  Laterality: N/A;   UMBILICAL HERNIA REPAIR  01/30/2012   Procedure: HERNIA REPAIR UMBILICAL ADULT;  Surgeon: Adin Hector, MD;  Location: [facility] ORS;  Service: General;  Laterality: N/A;  Primary Umbilical Hernia Repair     Allergies: Penicillins and Sulfa antibiotics  Medications: Prior to Admission medications   Medication Sig Start Date End Date Taking? Authorizing Provider  acetaminophen (TYLENOL) 650 MG CR tablet Take 650 mg by mouth every 8 (eight) hours as  needed for pain.   Yes [provider]  amLODipine (NORVASC) 5 MG tablet Take 5 mg by mouth daily. 09/11/21  Yes [provider]  losartan (COZAAR) 100 MG tablet Take 100 mg by mouth daily. 10/26/21  Yes [provider]  Sulfacetamide Sodium-Sulfur 10-5 % CREA Apply 1 Application topically daily. 09/29/21  Yes [provider]     Vital Signs: Ht '5\' 6"'$  (1.676 m)   Wt 141 lb 1.5 oz (64 kg)   BMI 22.77 kg/m    Physical Exam awake, alert.  Chest with distant but clear breath sounds bilaterally.  Heart with regular rate and rhythm.  Abdomen soft, positive bowel sounds, nontender.  No significant lower extremity edema.  Imaging: No results found.  Labs:  CBC: Recent Labs    01/12/21 1441 01/24/21 1030 01/25/21 0545 12/27/21 0824  WBC 4.0 5.9 8.3 4.8  HGB 14.8 15.5 14.4 16.5  HCT 41.9 42.9 40.6 45.5  PLT 111* 133* 117* 112*    COAGS: Recent Labs    01/12/21 1441 12/27/21 0824  INR 1.2 1.0  APTT 33  --     BMP: Recent Labs    01/12/21 1441 01/25/21 0545 12/27/21 0824  NA 137 131* 137  K 4.3 4.2 4.4  CL 104 101 104  CO2 27 21* 25  GLUCOSE 154* 279* 188*  BUN 22 22 30*  CALCIUM 9.6 9.1 10.0  CREATININE 1.10 1.15 1.64*  GFRNONAA >60 >60 46*    LIVER FUNCTION TESTS: Recent Labs    01/12/21 1441 12/27/21 0824  BILITOT 0.9 1.3*  AST 52* 114*  ALT 44 97*  ALKPHOS 94 111  PROT 7.3 8.3*  ALBUMIN 3.5 3.9    Assessment and Plan: Patient familiar to IR service from prior percutaneous microwave thermal ablation of a 3 cm segment 2 HCC within the left lobe of the liver on 01/24/2021.  He underwent 12 weeks of treatment with Epclusa for hepatitis C after ablation. He had a rise in liver enzymes after treatment concerning for relapse of HCV and also continues to drink alcohol. He had a follow up MRI of the abdomen on 10/11/21 which demonstrated a new 9 x 11 mm subcapsular area of arterial hyperenhancement in the right lobe of the liver suspicious for a new focus of early [place].  Contrast-enhanced ultrasound of the liver on 11/27/2021 revealed that the lesion in the peripheral right lobe of the liver segment V was consistent with a small HCC and categorized as a CEUS LR-5 lesion.There was no evidence of abnormal enhancement by contrast enhanced ultrasound surrounding the area of prior ablation within the left lobe of the liver to suggest recurrence previously ablated hepatocellular  carcinoma.  Following discussions with Dr. Kathlene Cote he was deemed an appropriate candidate for image guided microwave ablation of the right liver lesion and presents today for the procedure.  Details/risks of procedure, including but not limited to, internal bleeding, infection, injury to adjacent structures, anesthesia related complications discussed with patient with his understanding and consent.   Electronically Signed: D. Rowe Robert, PA-C 01/02/2022, 10:26 AM   I spent a total of 25 minutes at the the patient's bedside AND on the patient's hospital floor or unit, greater than 50% of which was counseling/coordinating care for image guided microwave ablation of right liver lesion

## 2022-01-02 NOTE — Anesthesia Procedure Notes (Signed)
Procedure Name: Intubation Date/Time: 01/02/2022 11:47 AM  Performed by: Milford Cage, CRNAPre-anesthesia Checklist: Patient identified, Emergency Drugs available, Suction available and Patient being monitored Patient Re-evaluated:Patient Re-evaluated prior to induction Oxygen Delivery Method: Circle system utilized Preoxygenation: Pre-oxygenation with 100% oxygen Induction Type: IV induction Ventilation: Mask ventilation without difficulty Laryngoscope Size: Miller and 2 Grade View: Grade I Tube type: Oral Tube size: 7.5 mm Number of attempts: 1 Airway Equipment and Method: Stylet Placement Confirmation: ETT inserted through vocal cords under direct vision, positive ETCO2 and breath sounds checked- equal and bilateral Secured at: 21 cm Tube secured with: Tape Dental Injury: Teeth and Oropharynx as per pre-operative assessment

## 2022-01-02 NOTE — Transfer of Care (Signed)
Immediate Anesthesia Transfer of Care Note  Patient: Levi Conner  Procedure(s) Performed: MICROWAVE ABLATION OF LIVER  Patient Location: PACU  Anesthesia Type:General  Level of Consciousness: awake  Airway & Oxygen Therapy: Patient Spontanous Breathing and Patient connected to face mask oxygen  Post-op Assessment: Report given to RN and Post -op Vital signs reviewed and stable  Post vital signs: Reviewed and stable  Last Vitals:  Vitals Value Taken Time  BP 171/106 01/02/22 1408  Temp 36.4 C 01/02/22 1408  Pulse 78 01/02/22 1414  Resp 23 01/02/22 1414  SpO2 100 % 01/02/22 1414  Vitals shown include unvalidated device data.  Last Pain:  Vitals:   01/02/22 1000  PainSc: 0-No pain         Complications: No notable events documented.

## 2022-01-02 NOTE — Anesthesia Preprocedure Evaluation (Signed)
Anesthesia Evaluation  Patient identified by MRN, date of birth, ID band Patient awake    Reviewed: Allergy & Precautions, NPO status , Patient's Chart, lab work & pertinent test results  Airway Mallampati: III  TM Distance: >3 FB Neck ROM: Full    Dental  (+) Dental Advisory Given, Upper Dentures, Missing,    Pulmonary Current Smoker and Patient abstained from smoking.,    Pulmonary exam normal breath sounds clear to auscultation       Cardiovascular hypertension, Normal cardiovascular exam Rhythm:Regular Rate:Normal     Neuro/Psych negative neurological ROS  negative psych ROS   GI/Hepatic negative GI ROS, (+) Hepatitis -, CHEPATOCELLULAR CARCINOMA   Endo/Other  diabetes, Well Controlled, Type 2  Renal/GU negative Renal ROS     Musculoskeletal  (+) Arthritis ,   Abdominal   Peds  Hematology  (+) Blood dyscrasia (thrombocytopenia), ,   Anesthesia Other Findings Day of surgery medications reviewed with the patient.  Reproductive/Obstetrics                            Anesthesia Physical Anesthesia Plan  ASA: 3  Anesthesia Plan: General   Post-op Pain Management: Minimal or no pain anticipated   Induction: Intravenous  PONV Risk Score and Plan: 1 and Dexamethasone and Ondansetron  Airway Management Planned: Oral ETT  Additional Equipment:   Intra-op Plan:   Post-operative Plan: Extubation in OR  Informed Consent: I have reviewed the patients History and Physical, chart, labs and discussed the procedure including the risks, benefits and alternatives for the proposed anesthesia with the patient or authorized representative who has indicated his/her understanding and acceptance.     Dental advisory given  Plan Discussed with: CRNA  Anesthesia Plan Comments:         Anesthesia Quick Evaluation

## 2022-01-02 NOTE — Procedures (Signed)
Interventional Radiology Procedure Note  Procedure: Microwave thermal ablation of right lobe hepatocellular carcinoma  Anesthesia: General   Complications: None  Estimated Blood Loss: < 10 mL  Findings: 13 mm right subcapsular hepatocellular carcinoma ablated with single NeuWave XT probe for 5 min at 65 watts.  Plan: PACU recovery followed by overnight observation.  [place]et[place] Night. Kathlene Cote, M.D Pager:  [phone]

## 2022-01-03 ENCOUNTER — Encounter (HOSPITAL_COMMUNITY): Payer: Self-pay | Admitting: Interventional Radiology

## 2022-01-03 DIAGNOSIS — Z79899 Other long term (current) drug therapy: Secondary | ICD-10-CM | POA: Diagnosis not present

## 2022-01-03 DIAGNOSIS — I10 Essential (primary) hypertension: Secondary | ICD-10-CM | POA: Diagnosis not present

## 2022-01-03 DIAGNOSIS — F17200 Nicotine dependence, unspecified, uncomplicated: Secondary | ICD-10-CM | POA: Diagnosis not present

## 2022-01-03 DIAGNOSIS — E119 Type 2 diabetes mellitus without complications: Secondary | ICD-10-CM | POA: Diagnosis not present

## 2022-01-03 DIAGNOSIS — C220 Liver cell carcinoma: Secondary | ICD-10-CM | POA: Diagnosis not present

## 2022-01-03 MED ORDER — HYDROCODONE-ACETAMINOPHEN 5-325 MG PO TABS: 1.0000 | ORAL_TABLET | Freq: Four times a day (QID) | ORAL | 0 refills | Status: DC | PRN

## 2022-01-03 NOTE — Anesthesia Postprocedure Evaluation (Signed)
Anesthesia Post Note  Patient: Levi Conner  Procedure(s) Performed: MICROWAVE ABLATION OF LIVER     Patient location during evaluation: PACU Anesthesia Type: General Level of consciousness: awake and alert Pain management: pain level controlled Vital Signs Assessment: post-procedure vital signs reviewed and stable Respiratory status: spontaneous breathing, nonlabored ventilation, respiratory function stable and patient connected to nasal cannula oxygen Cardiovascular status: blood pressure returned to baseline and stable Postop Assessment: no apparent nausea or vomiting Anesthetic complications: no   No notable events documented.  Last Vitals:  Vitals:   01/03/22 0552 01/03/22 0847  BP: (!) 126/92 (!) 142/86  Pulse: 71 61  Resp:    Temp: (!) 36.3 C   SpO2: 96%     Last Pain:  Vitals:   01/03/22 0726  TempSrc:   PainSc: [place] Yomar Mejorado

## 2022-01-03 NOTE — Discharge Summary (Signed)
Patient ID: Levi Conner MRN: [id number] DOB/AGE: 11-23-1955 66 y.o.  Admit date: 01/02/2022 Discharge date: 01/03/2022  Supervising Physician: Aletta Edouard  Patient Status: [facility] - In-pt  Admission Diagnoses: Hepatocellular carcinoma  Discharge Diagnoses:  Principal Problem:   Hepatocellular carcinoma [facility]) S/p CT guided thermal ablation 01/02/22  Discharged Condition: good  Hospital Course: Levi Conner is a 66 yo male familiar to IR service from prior percutaneous microwave thermal ablation of a 3 cm segment 2 HCC within the left lobe of the liver on 01/24/2021.  He underwent 12 weeks of treatment with Epclusa for hepatitis C after ablation. He was admitted to [facility] following a CT-guided thermal ablation of a 1.3cm lesion of the right lobe of the liver. Pt was monitored overnight and reports feeling well and eager to return home on the morning of 01/03/22. He denies nausea and vomiting, reports tolerating his food without issue, reports that he has urinated several times this AM after removal of Foley catheter, and that his pain is controlled.  Consults:  Anesthesia  Significant Diagnostic Studies:   CT GUIDE TISSUE ABLATION  Result Date: 01/02/2022 CLINICAL DATA:  History of hepatocellular carcinoma with prior thermal ablation of a left lobe carcinoma in 2022. MRI and contrast enhanced ultrasound has demonstrated a new subcapsular lesion in the right lobe of the liver meeting imaging criteria for hepatocellular carcinoma. The patient presents for thermal ablation of this lesion. EXAM: CT-GUIDED PERCUTANEOUS THERMAL ABLATION OF LIVER COMPARISON:  Contrast enhanced ultrasound on 11/29/2021 and MRI of the abdomen on 10/11/2021 ANESTHESIA/SEDATION: Anesthesia:  General Medications: 500 mg IV Levaquin, 500 mg IV Flagyl. Antibiotic was administered in an appropriate time interval prior to needle puncture of the skin. CONTRAST:  None. PROCEDURE: The procedure, risks,  benefits, and alternatives were explained to the patient. Questions regarding the procedure were encouraged and answered. The patient understands and consents to the procedure. The patient was placed under general anesthesia. A time-out was performed prior to initiating the procedure. Initial unenhanced CT and ultrasound was performed in a supine position to localize the liver. The abdominal wall was prepped with chlorhexidine in a sterile fashion, and a sterile drape was applied covering the operative field. A sterile gown and sterile gloves were used for the procedure. Under CT guidance, a 15 cm length NeuWave XT percutaneous microwave thermal ablation probe was advanced into the liver under ultrasound guidance. Probe positioning was confirmed by CT prior to ablation. Thermal ablation was performed through the probe under real-time ultrasound. Ablation was performed for 5 minutes at 65 watts. Tract cautery was then performed through the probe as the probe was retracted and removed. COMPLICATIONS: None FINDINGS: Hypoechoic subcapsular lesion in the right lobe of the liver is again identified by ultrasound and measures approximately 1.3 cm in maximum diameter. After advancing a probe through the lesion, ablation was successfully performed. IMPRESSION: CT guided percutaneous thermal ablation of 1.3 cm subcapsular hepatocellular carcinoma in the right lobe of the liver. The patient will be observed overnight. Electronically Signed   By: Aletta Edouard M.D.   On: 01/02/2022 15:22     Recent Results (from the past 2160 hour(s))  Glucose, capillary     Status: Abnormal   Collection Time: 12/27/21  8:12 AM  Result Value Ref Range   Glucose-Capillary 191 (H) 70 - 99 mg/dL    Comment: Glucose reference range applies only to samples taken after fasting for at least 8 hours.  CBC with Differential/Platelet  Status: Abnormal   Collection Time: 12/27/21  8:24 AM  Result Value Ref Range   WBC 4.8 4.0 - 10.5 K/uL    RBC 4.63 4.22 - 5.81 MIL/uL   Hemoglobin 16.5 13.0 - 17.0 g/dL   HCT 45.5 39.0 - 52.0 %   MCV 98.3 80.0 - 100.0 fL   MCH 35.6 (H) 26.0 - 34.0 pg   MCHC 36.3 (H) 30.0 - 36.0 g/dL   RDW 12.5 11.5 - 15.5 %   Platelets 112 (L) 150 - 400 K/uL   nRBC 0.0 0.0 - 0.2 %   Neutrophils Relative % 45 %   Neutro Abs 2.2 1.7 - 7.7 K/uL   Lymphocytes Relative 37 %   Lymphs Abs 1.8 0.7 - 4.0 K/uL   Monocytes Relative 10 %   Monocytes Absolute 0.5 0.1 - 1.0 K/uL   Eosinophils Relative 6 %   Eosinophils Absolute 0.3 0.0 - 0.5 K/uL   Basophils Relative 2 %   Basophils Absolute 0.1 0.0 - 0.1 K/uL   Immature Granulocytes 0 %   Abs Immature Granulocytes 0.01 0.00 - 0.07 K/uL    Comment: Performed at [facility], [place] [address] [place], [place] [address]  Protime-INR     Status: None   Collection Time: 12/27/21  8:24 AM  Result Value Ref Range   Prothrombin Time 13.5 11.4 - 15.2 seconds   INR 1.0 0.8 - 1.2    Comment: (NOTE) INR goal varies based on device and disease states. Performed at [facility], [place] [address], [place], [place] [address]   Comprehensive metabolic panel     Status: Abnormal   Collection Time: 12/27/21  8:24 AM  Result Value Ref Range   Sodium 137 135 - 145 mmol/L   Potassium 4.4 3.5 - 5.1 mmol/L   Chloride 104 98 - 111 mmol/L   CO2 25 22 - 32 mmol/L   Glucose, Bld 188 (H) 70 - 99 mg/dL    Comment: Glucose reference range applies only to samples taken after fasting for at least 8 hours.   BUN 30 (H) 8 - 23 mg/dL   Creatinine, Ser 1.64 (H) 0.61 - 1.24 mg/dL   Calcium 10.0 8.9 - 10.3 mg/dL   Total Protein 8.3 (H) 6.5 - 8.1 g/dL   Albumin 3.9 3.5 - 5.0 g/dL   AST 114 (H) 15 - 41 U/L   ALT 97 (H) 0 - 44 U/L   Alkaline Phosphatase 111 38 - 126 U/L   Total Bilirubin 1.3 (H) 0.3 - 1.2 mg/dL   GFR, Estimated 46 (L) >60 mL/min    Comment: (NOTE) Calculated using the CKD-EPI Creatinine Equation (2021)    Anion gap 8 5 - 15     Comment: Performed at [facility], [place] [address], [place], [place] [address]  Hemoglobin A1c per protocol     Status: Abnormal   Collection Time: 12/27/21  8:24 AM  Result Value Ref Range   Hgb A1c MFr Bld 6.0 (H) 4.8 - 5.6 %    Comment: (NOTE) Pre diabetes:          5.7%-6.4%  Diabetes:              >6.4%  Glycemic control for   <7.0% adults with diabetes    Mean Plasma Glucose 125.5 mg/dL    Comment: Performed at [place] [address], [place], [place] [address]  Glucose, capillary     Status: Abnormal  Collection Time: 01/02/22  9:48 AM  Result Value Ref Range   Glucose-Capillary 156 (H) 70 - 99 mg/dL    Comment: Glucose reference range applies only to samples taken after fasting for at least 8 hours.   Comment 1 Notify RN    Comment 2 Document in Chart   CBC with Differential/Platelet     Status: Abnormal   Collection Time: 01/02/22  9:53 AM  Result Value Ref Range   WBC 4.1 4.0 - 10.5 K/uL   RBC 4.36 4.22 - 5.81 MIL/uL   Hemoglobin 15.3 13.0 - 17.0 g/dL   HCT 42.4 39.0 - 52.0 %   MCV 97.2 80.0 - 100.0 fL   MCH 35.1 (H) 26.0 - 34.0 pg   MCHC 36.1 (H) 30.0 - 36.0 g/dL   RDW 12.2 11.5 - 15.5 %   Platelets 100 (L) 150 - 400 K/uL    Comment: SPECIMEN CHECKED FOR CLOTS Immature Platelet Fraction may be clinically indicated, consider ordering this additional test [id number] REPEATED TO VERIFY PLATELET COUNT CONFIRMED BY SMEAR    nRBC 0.0 0.0 - 0.2 %   Neutrophils Relative % 40 %   Neutro Abs 1.7 1.7 - 7.7 K/uL   Lymphocytes Relative 40 %   Lymphs Abs 1.6 0.7 - 4.0 K/uL   Monocytes Relative 12 %   Monocytes Absolute 0.5 0.1 - 1.0 K/uL   Eosinophils Relative 5 %   Eosinophils Absolute 0.2 0.0 - 0.5 K/uL   Basophils Relative 2 %   Basophils Absolute 0.1 0.0 - 0.1 K/uL   Immature Granulocytes 1 %   Abs Immature Granulocytes 0.03 0.00 - 0.07 K/uL    Comment: Performed at [facility], [place] [address] [place],  [place] [address]  Comprehensive metabolic panel     Status: Abnormal   Collection Time: 01/02/22  9:53 AM  Result Value Ref Range   Sodium 142 135 - 145 mmol/L   Potassium 4.1 3.5 - 5.1 mmol/L   Chloride 111 98 - 111 mmol/L   CO2 24 22 - 32 mmol/L   Glucose, Bld 157 (H) 70 - 99 mg/dL    Comment: Glucose reference range applies only to samples taken after fasting for at least 8 hours.   BUN 25 (H) 8 - 23 mg/dL   Creatinine, Ser 1.25 (H) 0.61 - 1.24 mg/dL   Calcium 9.7 8.9 - 10.3 mg/dL   Total Protein 7.9 6.5 - 8.1 g/dL   Albumin 3.8 3.5 - 5.0 g/dL   AST 95 (H) 15 - 41 U/L   ALT 91 (H) 0 - 44 U/L   Alkaline Phosphatase 103 38 - 126 U/L   Total Bilirubin 1.2 0.3 - 1.2 mg/dL   GFR, Estimated >60 >60 mL/min    Comment: (NOTE) Calculated using the CKD-EPI Creatinine Equation (2021)    Anion gap 7 5 - 15    Comment: Performed at [facility], [place] [address], [place], [place] [address]  Glucose, capillary     Status: Abnormal   Collection Time: 01/02/22  2:26 PM  Result Value Ref Range   Glucose-Capillary 191 (H) 70 - 99 mg/dL    Comment: Glucose reference range applies only to samples taken after fasting for at least 8 hours.     Treatments: CT-guided thermal ablation of liver lesion   Discharge Exam: Blood pressure (!) 142/86, pulse 61, temperature (!) 97.4 F (36.3 C), temperature source Oral, resp. rate 18, height '5\' 6"'$  (1.676 m), weight 141 lb  1.5 oz (64 kg), SpO2 96 %. General: Pt alert and sitting in chair eating breakfast at time of exam; no acute distress Pulm: Lungs CTA bilaterally, no respiratory distress CV: Normal S1/S2, no murmurs, rubs, or gallops, pulses 2+ in all extremities Abdominal: Mild tenderness to deep palpation in RUQ at puncture site, bowel sounds normal Skin: Puncture sites unremarkable with no evidence of hematoma, bleeding, or drainage; dressing over puncture sites clean, dry, intact   Disposition: Discharge disposition: 01-Home or Self  Care       Discharge Instructions     Call MD for:  difficulty breathing, headache or visual disturbances   Complete by: As directed    Call MD for:  extreme fatigue   Complete by: As directed    Call MD for:  hives   Complete by: As directed    Call MD for:  persistant dizziness or light-headedness   Complete by: As directed    Call MD for:  persistant nausea and vomiting   Complete by: As directed    Call MD for:  redness, tenderness, or signs of infection (pain, swelling, redness, odor or green/yellow discharge around incision site)   Complete by: As directed    Call MD for:  severe uncontrolled pain   Complete by: As directed    Call MD for:  temperature >100.4   Complete by: As directed    Diet - low sodium heart healthy   Complete by: As directed    Discharge instructions   Complete by: As directed    Dr Kathlene Cote will schedule a phone appointment with you in approximately 4 weeks; resume home medications; avoid heavy lifting and strenuous activity for 1 week; do not drive for 24 hours or after taking narcotic pain medication; take pain medication with food; please call [phone] or [phone] with any questions or concerns   Increase activity slowly   Complete by: As directed    Do not drive for 24 hours or after taking narcotic medication; Avoid strenuous activity and heavy lifting for one week   Remove dressing in 24 hours   Complete by: As directed       Allergies as of 01/03/2022       Reactions   Penicillins    Has patient had a PCN reaction causing immediate rash, facial/tongue/throat swelling, SOB or lightheadedness with hypotension: Unk Has patient had a PCN reaction causing severe rash involving mucus membranes or skin necrosis: Unk Has patient had a PCN reaction that required hospitalization: Unk Has patient had a PCN reaction occurring within the last 10 years: NO If all of the above answers are "NO", then may proceed with Cephalosporin use.   Sulfa  Antibiotics Other (See Comments)   unknown        Medication List     TAKE these medications    acetaminophen 650 MG CR tablet Commonly known as: TYLENOL Take 650 mg by mouth every 8 (eight) hours as needed for pain.   amLODipine 5 MG tablet Commonly known as: NORVASC Take 5 mg by mouth daily.   HYDROcodone-acetaminophen 5-325 MG tablet Commonly known as: NORCO/VICODIN Take 1 tablet by mouth every 6 (six) hours as needed for severe pain.   losartan 100 MG tablet Commonly known as: COZAAR Take 100 mg by mouth daily.   Sulfacetamide Sodium-Sulfur 10-5 % Crea Apply 1 Application topically daily.        Follow-up Information     Aletta Edouard, MD Follow up.   Specialties: Interventional  Radiology, Radiology Why: Dr Kathlene Cote will follow up with you in 4 weeks with a phone appointment; IR scheduler will reach out to you to schedule this appointment; please call [phone] with any questions or concerns Contact information: [address] [place] [address] [phone]         [place], [place], [place] Follow up.   Specialty: Nurse Practitioner Why: Follow up with this provider as scheduled Contact information: [address] [place] [address] [phone]                  Electronically Signed: Lura Em, PA-C 01/03/2022, 11:07 AM   I have spent Greater Than 30 Minutes discharging ALEXANDAR WEISENBERGER.

## 2022-01-09 DIAGNOSIS — K006 Disturbances in tooth eruption: Secondary | ICD-10-CM | POA: Diagnosis not present

## 2022-01-09 DIAGNOSIS — R69 Illness, unspecified: Secondary | ICD-10-CM | POA: Diagnosis not present

## 2022-01-21 DIAGNOSIS — K7469 Other cirrhosis of liver: Secondary | ICD-10-CM | POA: Diagnosis not present

## 2022-01-21 DIAGNOSIS — B182 Chronic viral hepatitis C: Secondary | ICD-10-CM | POA: Diagnosis not present

## 2022-01-21 DIAGNOSIS — C220 Liver cell carcinoma: Secondary | ICD-10-CM | POA: Diagnosis not present

## 2022-01-30 ENCOUNTER — Telehealth: Payer: Medicare HMO

## 2022-01-30 ENCOUNTER — Ambulatory Visit
Admission: RE | Admit: 2022-01-30 | Discharge: 2022-01-30 | Disposition: A | Discharge status: Home / Self Care | Payer: Medicare HMO | Source: Ambulatory Visit | Attending: Student | Admitting: Student

## 2022-01-30 DIAGNOSIS — C220 Liver cell carcinoma: Secondary | ICD-10-CM | POA: Diagnosis not present

## 2022-01-30 HISTORY — PX: IR RADIOLOGIST EVAL & MGMT: IMG5224

## 2022-01-30 NOTE — Progress Notes (Signed)
Chief Complaint: Patient was consulted remotely today (TeleHealth) for follow-up after thermal ablation of hepatocellular carcinoma.  History of Present Illness: Levi Conner is a 66 y.o. male status post percutaneous microwave thermal ablation of a 3 cm segment II hepatocellular carcinoma within the left lobe of the liver on 01/24/2021 and more recently microwave ablation of a second 1.3 cm subcapsular hepatocellular carcinoma of the right lobe of the liver on 01/02/2022.  Since that procedure, he has had tenderness and numbness in the upper abdomen.  This is not severe and he has been able to return to work full-time.  He denies fever.  Past Medical History:  Diagnosis Date   Arthritis    Borderline diabetic    diet controlled, pt does not check cbg   Colon polyps    Diabetes mellitus without complication (HCC)    no meds   Hemorrhoids    Hepatitis C    tumor on liver treated befor can treat Hep C   Hernia, inguinal, bilateral    current   Hypertension    Melanoma ([place])    Pneumonia 10/23/2011   HOSPITALIZED FOR PNEUMONIA MAY 2013.  PT HAS PULMONARY CLEARANCE FROM DR. WERT FOR HERNIA REPAIR WITH DR. Johney Maine   Pre-diabetes    Rash  since 7-8-201   red rash around bellybutton and belt line and both feet  - resolved    Past Surgical History:  Procedure Laterality Date   ESOPHAGOGASTRODUODENOSCOPY  03/26/2012   Procedure: ESOPHAGOGASTRODUODENOSCOPY (EGD);  Surgeon: Beryle Beams, MD;  Location: [facility] ENDOSCOPY;  Service: Endoscopy;  Laterality: N/A;   HERNIA REPAIR  01/30/2012   umb hernia   INGUINAL HERNIA REPAIR  01/30/2012   Procedure: LAPAROSCOPIC BILATERAL INGUINAL HERNIA REPAIR;  Surgeon: Adin Hector, MD;  Location: [facility] ORS;  Service: General;  Laterality: Bilateral;   IR RADIOLOGIST EVAL & MGMT  12/26/2020   IR RADIOLOGIST EVAL & MGMT  02/13/2021   IR RADIOLOGIST EVAL & MGMT  05/31/2021   IR RADIOLOGIST EVAL & MGMT  11/12/2021   RADIOLOGY WITH ANESTHESIA N/A  01/24/2021   Procedure: CT WITH ANESTHESIA-THERMAL ABLATION;  Surgeon: Aletta Edouard, MD;  Location: [facility] ORS;  Service: Radiology;  Laterality: N/A;   RADIOLOGY WITH ANESTHESIA N/A 01/02/2022   Procedure: MICROWAVE ABLATION OF LIVER;  Surgeon: Aletta Edouard, MD;  Location: [facility] ORS;  Service: Radiology;  Laterality: N/A;   UMBILICAL HERNIA REPAIR  01/30/2012   Procedure: HERNIA REPAIR UMBILICAL ADULT;  Surgeon: Adin Hector, MD;  Location: [facility] ORS;  Service: General;  Laterality: N/A;  Primary Umbilical Hernia Repair    Allergies: Penicillins and Sulfa antibiotics  Medications: Prior to Admission medications   Medication Sig Start Date End Date Taking? Authorizing Provider  acetaminophen (TYLENOL) 650 MG CR tablet Take 650 mg by mouth every 8 (eight) hours as needed for pain.    [provider]  amLODipine (NORVASC) 5 MG tablet Take 5 mg by mouth daily. 09/11/21   [provider]  HYDROcodone-acetaminophen (NORCO/VICODIN) 5-325 MG tablet Take 1 tablet by mouth every 6 (six) hours as needed for severe pain. 01/03/22   Lura Em, PA  losartan (COZAAR) 100 MG tablet Take 100 mg by mouth daily. 10/26/21   [provider]  Sulfacetamide Sodium-Sulfur 10-5 % CREA Apply 1 Application topically daily. 09/29/21   [provider]     No family history on file.  Social History   Socioeconomic History   Marital status: Single  Spouse name: Not on file   Number of children: Not on file   Years of education: Not on file   Highest education level: Not on file  Occupational History   Occupation: Architect  Tobacco Use   Smoking status: Some Days    Packs/day: 0.50    Years: 30.00    Total pack years: 15.00    Types: Cigarettes   Smokeless tobacco: Never  Vaping Use   Vaping Use: Never used  Substance and Sexual Activity   Alcohol use: Not Currently   Drug use: No   Sexual activity: Not Currently  Other Topics Concern   Not on file  Social  History Narrative   Is concerned about a significant mold problem in his friend's home where he is living.    Social Determinants of Health   Financial Resource Strain: Not on file  Food Insecurity: Not on file  Transportation Needs: Not on file  Physical Activity: Not on file  Stress: Not on file  Social Connections: Not on file    ECOG Status: 1 - Symptomatic but completely ambulatory  Review of Systems  Constitutional: Negative.   Respiratory: Negative.    Cardiovascular: Negative.   Gastrointestinal:  Positive for abdominal pain. Negative for abdominal distention, blood in stool, diarrhea, nausea and vomiting.  Genitourinary: Negative.   Musculoskeletal: Negative.   Neurological:  Positive for numbness. Negative for dizziness, weakness and headaches.       Focal numbness in epigastric region.    Review of Systems: A 12 point ROS discussed and pertinent positives are indicated in the HPI above.  All other systems are negative.   Physical Exam No direct physical exam was performed (except for noted visual exam findings with Video Visits).   Vital Signs: There were no vitals taken for this visit.  Imaging: CT GUIDE TISSUE ABLATION  Result Date: 01/02/2022 CLINICAL DATA:  History of hepatocellular carcinoma with prior thermal ablation of a left lobe carcinoma in 2022. MRI and contrast enhanced ultrasound has demonstrated a new subcapsular lesion in the right lobe of the liver meeting imaging criteria for hepatocellular carcinoma. The patient presents for thermal ablation of this lesion. EXAM: CT-GUIDED PERCUTANEOUS THERMAL ABLATION OF LIVER COMPARISON:  Contrast enhanced ultrasound on 11/29/2021 and MRI of the abdomen on 10/11/2021 ANESTHESIA/SEDATION: Anesthesia:  General Medications: 500 mg IV Levaquin, 500 mg IV Flagyl. Antibiotic was administered in an appropriate time interval prior to needle puncture of the skin. CONTRAST:  None. PROCEDURE: The procedure, risks, benefits,  and alternatives were explained to the patient. Questions regarding the procedure were encouraged and answered. The patient understands and consents to the procedure. The patient was placed under general anesthesia. A time-out was performed prior to initiating the procedure. Initial unenhanced CT and ultrasound was performed in a supine position to localize the liver. The abdominal wall was prepped with chlorhexidine in a sterile fashion, and a sterile drape was applied covering the operative field. A sterile gown and sterile gloves were used for the procedure. Under CT guidance, a 15 cm length NeuWave XT percutaneous microwave thermal ablation probe was advanced into the liver under ultrasound guidance. Probe positioning was confirmed by CT prior to ablation. Thermal ablation was performed through the probe under real-time ultrasound. Ablation was performed for 5 minutes at 65 watts. Tract cautery was then performed through the probe as the probe was retracted and removed. COMPLICATIONS: None FINDINGS: Hypoechoic subcapsular lesion in the right lobe of the liver is again identified by ultrasound  and measures approximately 1.3 cm in maximum diameter. After advancing a probe through the lesion, ablation was successfully performed. IMPRESSION: CT guided percutaneous thermal ablation of 1.3 cm subcapsular hepatocellular carcinoma in the right lobe of the liver. The patient will be observed overnight. Electronically Signed   By: Aletta Edouard M.D.   On: 01/02/2022 15:22    Labs:  CBC: Recent Labs    12/27/21 0824 01/02/22 0953  WBC 4.8 4.1  HGB 16.5 15.3  HCT 45.5 42.4  PLT 112* 100*    COAGS: Recent Labs    12/27/21 0824  INR 1.0    BMP: Recent Labs    12/27/21 0824 01/02/22 0953  NA 137 142  K 4.4 4.1  CL 104 111  CO2 25 24  GLUCOSE 188* 157*  BUN 30* 25*  CALCIUM 10.0 9.7  CREATININE 1.64* 1.25*  GFRNONAA 46* >60    LIVER FUNCTION TESTS: Recent Labs    12/27/21 0824  01/02/22 0953  BILITOT 1.3* 1.2  AST 114* 95*  ALT 97* 91*  ALKPHOS 111 103  PROT 8.3* 7.9  ALBUMIN 3.9 3.8     Assessment and Plan:  I spoke with Levi Conner over the phone.  I explained to him that his symptoms of upper abdominal tenderness and some focal numbness could certainly be secondary to the recent microwave ablation procedure as the hepatocellular carcinoma that was ablated was near the anterior capsular surface of the right lobe.  I told him that I would anticipate his symptoms to continue to improve over time. I recommended a follow-up MRI in mid September, 2 months after ablation.    Electronically Signed: Azzie Roup 01/30/2022, 2:05 PM    I spent a total of 10 Minutes in remote  clinical consultation, greater than 50% of which was counseling/coordinating care .    Visit type: Audio only (telephone). Audio (no video) only due to patient's lack of internet/smartphone capability. Alternative for in-person consultation at [facility], [address] [place], [place], [place]. This visit type was conducted due to national recommendations for restrictions regarding the COVID-19 Pandemic (e.g. social distancing).  This format is felt to be most appropriate for this patient at this time.  All issues noted in this document were discussed and addressed.

## 2022-01-31 DIAGNOSIS — R69 Illness, unspecified: Secondary | ICD-10-CM | POA: Diagnosis not present

## 2022-02-15 DIAGNOSIS — G479 Sleep disorder, unspecified: Secondary | ICD-10-CM | POA: Diagnosis not present

## 2022-02-15 DIAGNOSIS — I10 Essential (primary) hypertension: Secondary | ICD-10-CM | POA: Diagnosis not present

## 2022-02-15 DIAGNOSIS — F419 Anxiety disorder, unspecified: Secondary | ICD-10-CM | POA: Diagnosis not present

## 2022-02-21 DIAGNOSIS — I959 Hypotension, unspecified: Secondary | ICD-10-CM | POA: Diagnosis not present

## 2022-02-27 ENCOUNTER — Other Ambulatory Visit: Payer: Self-pay | Admitting: Interventional Radiology

## 2022-02-27 DIAGNOSIS — C220 Liver cell carcinoma: Secondary | ICD-10-CM

## 2022-03-06 ENCOUNTER — Ambulatory Visit (HOSPITAL_COMMUNITY)
Admission: RE | Admit: 2022-03-06 | Discharge: 2022-03-06 | Disposition: A | Discharge status: Home / Self Care | Payer: Medicare HMO | Source: Ambulatory Visit | Attending: Interventional Radiology | Admitting: Interventional Radiology

## 2022-03-06 DIAGNOSIS — R161 Splenomegaly, not elsewhere classified: Secondary | ICD-10-CM | POA: Diagnosis not present

## 2022-03-06 DIAGNOSIS — C220 Liver cell carcinoma: Secondary | ICD-10-CM | POA: Diagnosis not present

## 2022-03-06 DIAGNOSIS — K7469 Other cirrhosis of liver: Secondary | ICD-10-CM | POA: Diagnosis not present

## 2022-03-06 DIAGNOSIS — K766 Portal hypertension: Secondary | ICD-10-CM | POA: Diagnosis not present

## 2022-03-06 MED ORDER — GADOBUTROL 1 MMOL/ML IV SOLN
6.0000 mL | Freq: Once | INTRAVENOUS | Status: AC | PRN
Start: 2022-03-06 — End: 2022-03-06
  Administered 2022-03-06: 6 mL via INTRAVENOUS

## 2022-03-08 DIAGNOSIS — I10 Essential (primary) hypertension: Secondary | ICD-10-CM | POA: Diagnosis not present

## 2022-03-08 DIAGNOSIS — Z1211 Encounter for screening for malignant neoplasm of colon: Secondary | ICD-10-CM | POA: Diagnosis not present

## 2022-03-15 ENCOUNTER — Encounter: Payer: Self-pay | Admitting: Internal Medicine

## 2022-03-15 ENCOUNTER — Ambulatory Visit
Admission: RE | Admit: 2022-03-15 | Discharge: 2022-03-15 | Disposition: A | Discharge status: Home / Self Care | Payer: Medicare HMO | Source: Ambulatory Visit | Attending: Interventional Radiology | Admitting: Interventional Radiology

## 2022-03-15 DIAGNOSIS — C220 Liver cell carcinoma: Secondary | ICD-10-CM

## 2022-03-15 HISTORY — PX: IR RADIOLOGIST EVAL & MGMT: IMG5224

## 2022-03-15 NOTE — Progress Notes (Signed)
Chief Complaint: Patient was consulted remotely today (TeleHealth) for follow-up after thermal ablation of hepatocellular carcinoma.  History of Present Illness: Levi Conner is a 66 y.o. male status post percutaneous microwave thermal ablation of a 3 cm segment II hepatocellular carcinoma within the left lobe of the liver on 01/24/2021 and more recently microwave ablation of a second 1.3 cm subcapsular hepatocellular carcinoma of the right lobe of the liver on 01/02/2022.  Abdominal tenderness in the upper abdomen has resolved.  He is feeling quite well and has not had any alcohol over the last 70 days.  He states that this has improved how he feels substantially.  Past Medical History:  Diagnosis Date   Arthritis    Borderline diabetic    diet controlled, pt does not check cbg   Colon polyps    Diabetes mellitus without complication (HCC)    no meds   Hemorrhoids    Hepatitis C    tumor on liver treated befor can treat Hep C   Hernia, inguinal, bilateral    current   Hypertension    Melanoma ([place])    Pneumonia 10/23/2011   HOSPITALIZED FOR PNEUMONIA MAY 2013.  PT HAS PULMONARY CLEARANCE FROM DR. WERT FOR HERNIA REPAIR WITH DR. Johney Maine   Pre-diabetes    Rash  since 7-8-201   red rash around bellybutton and belt line and both feet  - resolved    Past Surgical History:  Procedure Laterality Date   ESOPHAGOGASTRODUODENOSCOPY  03/26/2012   Procedure: ESOPHAGOGASTRODUODENOSCOPY (EGD);  Surgeon: Beryle Beams, MD;  Location: [facility] ENDOSCOPY;  Service: Endoscopy;  Laterality: N/A;   HERNIA REPAIR  01/30/2012   umb hernia   INGUINAL HERNIA REPAIR  01/30/2012   Procedure: LAPAROSCOPIC BILATERAL INGUINAL HERNIA REPAIR;  Surgeon: Adin Hector, MD;  Location: [facility] ORS;  Service: General;  Laterality: Bilateral;   IR RADIOLOGIST EVAL & MGMT  12/26/2020   IR RADIOLOGIST EVAL & MGMT  02/13/2021   IR RADIOLOGIST EVAL & MGMT  05/31/2021   IR RADIOLOGIST EVAL & MGMT  11/12/2021   IR  RADIOLOGIST EVAL & MGMT  01/30/2022   RADIOLOGY WITH ANESTHESIA N/A 01/24/2021   Procedure: CT WITH ANESTHESIA-THERMAL ABLATION;  Surgeon: Aletta Edouard, MD;  Location: [facility] OR[facility];  Service: Radiology;  Laterality: N/A;   RADIOLOGY WITH ANESTHESIA N/A 01/02/2022   Procedure: MICROWAVE ABLATION OF LIVER;  Surgeon: Aletta Edouard, MD;  Location: [facility];  Service: Radiology;  Laterality: N/A;   UMBILICAL HERNIA REPAIR  01/30/2012   Procedure: HERNIA REPAIR UMBILICAL ADULT;  Surgeon: Adin Hector, MD;  Location: [facility];  Service: General;  Laterality: N/A;  Primary Umbilical Hernia Repair    Allergies: Penicillins and Sulfa antibiotics  Medications: Prior to Admission medications   Medication Sig Start Date End Date Taking? Authorizing Provider  acetaminophen (TYLENOL) 650 MG CR tablet Take 650 mg by mouth every 8 (eight) hours as needed for pain.    [provider]  amLODipine (NORVASC) 5 MG tablet Take 5 mg by mouth daily. 09/11/21   [provider]  HYDROcodone-acetaminophen (NORCO/VICODIN) 5-325 MG tablet Take 1 tablet by mouth every 6 (six) hours as needed for severe pain. 01/03/22   Lura Em, PA  losartan (COZAAR) 100 MG tablet Take 100 mg by mouth daily. 10/26/21   [provider]  Sulfacetamide Sodium-Sulfur 10-5 % CREA Apply 1 Application topically daily. 09/29/21   [provider]     No family history on file.  Social History   Socioeconomic History   Marital status: Single    Spouse name: Not on file   Number of children: Not on file   Years of education: Not on file   Highest education level: Not on file  Occupational History   Occupation: Architect  Tobacco Use   Smoking status: Some Days    Packs/day: 0.50    Years: 30.00    Total pack years: 15.00    Types: Cigarettes   Smokeless tobacco: Never  Vaping Use   Vaping Use: Never used  Substance and Sexual Activity   Alcohol use: Not Currently   Drug use: No   Sexual  activity: Not Currently  Other Topics Concern   Not on file  Social History Narrative   Is concerned about a significant mold problem in his friend's home where he is living.    Social Determinants of Health   Financial Resource Strain: Not on file  Food Insecurity: Not on file  Transportation Needs: Not on file  Physical Activity: Not on file  Stress: Not on file  Social Connections: Not on file    ECOG Status: 0 - Asymptomatic  Review of Systems  Constitutional: Negative.   Respiratory: Negative.    Cardiovascular: Negative.   Gastrointestinal: Negative.   Genitourinary: Negative.   Musculoskeletal: Negative.   Neurological: Negative.     Review of Systems: A 12 point ROS discussed and pertinent positives are indicated in the HPI above.  All other systems are negative.  Physical Exam No direct physical exam was performed (except for noted visual exam findings with Video Visits).   Vital Signs: There were no vitals taken for this visit.  Imaging: MR ABDOMEN WWO CONTRAST  Result Date: 03/06/2022 CLINICAL DATA:  Follow-up thermal ablation of left hepatic lesion in 2022 and right hepatic lesion in 2023. EXAM: MRI ABDOMEN WITHOUT AND WITH CONTRAST TECHNIQUE: Multiplanar multisequence MR imaging of the abdomen was performed both before and after the administration of intravenous contrast. CONTRAST:  27m GADAVIST GADOBUTROL 1 MMOL/ML IV SOLN COMPARISON:  Multiple prior imaging studies. The most recent MR examination is 10/19/2021 FINDINGS: Lower chest: The lung bases are clear of an acute process. No pulmonary lesions, pleural or pericardial effusion. Hepatobiliary: Remote thermal ablation changes involving the left hepatic lobe with expected post ablation changes with internal increased T1 signal intensity likely some residual hemorrhage. The lesion has contracted since the prior MRI measuring a maximum of 2 cm and previously measuring 2.5 cm. No worrisome contrast enhancement to  suggest recurrent tumor. Expected post ablation changes involving the peripheral segment 5 lesion. This also demonstrates internal increased T1 signal intensity consistent with some residual hemorrhage. No contrast enhancement to suggest residual or recurrent tumor. No new enhancing hepatic lesions to suggest new hepatoma. Stable cirrhotic changes involving the liver with marked enlargement of the caudate lobe. Stable portal venous hypertension, portal venous collaterals and splenomegaly. No ascites. The gallbladder is unremarkable. Normal caliber and course of the common bile duct. Pancreas:  No mass, inflammation or ductal dilatation. Spleen:  Stable mild splenomegaly. No splenic lesions. Adrenals/Urinary Tract: The adrenal glands and kidneys are unremarkable and stable. Stomach/Bowel: The stomach, duodenum, visualized small bowel and visualized colon are grossly normal. Vascular/Lymphatic: The aorta and branch vessels are patent. The major venous structures are patent. No mesenteric or retroperitoneal mass or adenopathy. Stable upper abdominal lymph nodes typical with cirrhosis. Other:  No ascites or abdominal wall hernia. Musculoskeletal: No significant bony findings. IMPRESSION: 1. Expected  thermal ablation changes involving the left and right hepatic lobes. No worrisome contrast enhancement to suggest residual or recurrent tumor. 2. No new hepatic lesions to suggest new hepatoma. 3. Stable cirrhotic changes involving the liver with portal venous hypertension, portal venous collaterals and splenomegaly. No ascites. Electronically Signed   By: Marijo Sanes M.D.   On: 03/06/2022 18:25    Labs:  CBC: Recent Labs    12/27/21 0824 01/02/22 0953  WBC 4.8 4.1  HGB 16.5 15.3  HCT 45.5 42.4  PLT 112* 100*    COAGS: Recent Labs    12/27/21 0824  INR 1.0    BMP: Recent Labs    12/27/21 0824 01/02/22 0953  NA 137 142  K 4.4 4.1  CL 104 111  CO2 25 24  GLUCOSE 188* 157*  BUN 30* 25*   CALCIUM 10.0 9.7  CREATININE 1.64* 1.25*  GFRNONAA 46* >60    LIVER FUNCTION TESTS: Recent Labs    12/27/21 0824 01/02/22 0953  BILITOT 1.3* 1.2  AST 114* 95*  ALT 97* 91*  ALKPHOS 111 103  PROT 8.3* 7.9  ALBUMIN 3.9 3.8    Assessment and Plan:  I spoke with Levi Conner by phone. We reviewed findings from the follow-up MRI study dated 03/06/2022.  This demonstrates a new subcapsular ablation defect in the lateral right lobe of the liver after recent thermal ablation with no evidence of residual abnormal enhancement.  There is further contraction of the left lobe ablation defect since prior MRI in April with no evidence of abnormal enhancement to suggest recurrent hepatocellular carcinoma.  No new suspicious enhancement is identified in the liver.  AFP on 01/21/2022 was 5.7.  Levi Conner will follow-up with [place] at [facility]  As long as his AFP does not rise, I told him that we could likely wait 9 to 12 months to perform another follow-up MRI.   Electronically Signed: Azzie Roup 03/15/2022, 10:50 AM    I spent a total of 10 Minutes in remote  clinical consultation, greater than 50% of which was counseling/coordinating care post ablation of hepatocellular carcinomas.    Visit type: Audio only (telephone). Audio (no video) only due to patient's lack of internet/smartphone capability. Alternative for in-person consultation at [facility], [address], [place], [place]. This visit type was conducted due to national recommendations for restrictions regarding the COVID-19 Pandemic (e.g. social distancing).  This format is felt to be most appropriate for this patient at this time.  All issues noted in this document were discussed and addressed.

## 2022-03-22 DIAGNOSIS — I10 Essential (primary) hypertension: Secondary | ICD-10-CM | POA: Diagnosis not present

## 2022-03-22 DIAGNOSIS — E119 Type 2 diabetes mellitus without complications: Secondary | ICD-10-CM | POA: Diagnosis not present

## 2022-03-22 DIAGNOSIS — N529 Male erectile dysfunction, unspecified: Secondary | ICD-10-CM | POA: Diagnosis not present

## 2022-03-22 DIAGNOSIS — R351 Nocturia: Secondary | ICD-10-CM | POA: Diagnosis not present

## 2022-03-22 DIAGNOSIS — R634 Abnormal weight loss: Secondary | ICD-10-CM | POA: Diagnosis not present

## 2022-03-22 DIAGNOSIS — Z0000 Encounter for general adult medical examination without abnormal findings: Secondary | ICD-10-CM | POA: Diagnosis not present

## 2022-03-22 DIAGNOSIS — Z125 Encounter for screening for malignant neoplasm of prostate: Secondary | ICD-10-CM | POA: Diagnosis not present

## 2022-04-04 ENCOUNTER — Ambulatory Visit (AMBULATORY_SURGERY_CENTER): Payer: Self-pay

## 2022-04-04 VITALS — Ht 71.0 in | Wt 137.0 lb

## 2022-04-04 DIAGNOSIS — Z86010 Personal history of colonic polyps: Secondary | ICD-10-CM

## 2022-04-04 MED ORDER — PEG 3350-KCL-NA BICARB-NACL 420 G PO SOLR: 4000.0000 mL | Freq: Once | ORAL | 0 refills | Status: AC

## 2022-04-04 NOTE — Progress Notes (Signed)
No egg or soy allergy known to patient  No issues known to pt with past sedation with any surgeries or procedures Patient denies ever being told they had issues or difficulty with intubation  No FH of Malignant Hyperthermia Pt is not on diet pills Pt is not on home 02  Pt is not on blood thinners  Pt denies issues with constipation  No A fib or A flutter Have any cardiac testing pending--NO Pt instructed to use Singlecare.com or GoodRx for a price reduction on prep  Insurance verified during PV appt=[employer]

## 2022-04-10 ENCOUNTER — Encounter: Payer: Self-pay | Admitting: Internal Medicine

## 2022-04-18 ENCOUNTER — Ambulatory Visit (AMBULATORY_SURGERY_CENTER): Payer: Medicare HMO | Admitting: Internal Medicine

## 2022-04-18 ENCOUNTER — Encounter: Payer: Self-pay | Admitting: Internal Medicine

## 2022-04-18 VITALS — BP 144/86 | HR 83 | Temp 98.2°F | Resp 17 | Ht 71.0 in | Wt 137.0 lb

## 2022-04-18 DIAGNOSIS — Z09 Encounter for follow-up examination after completed treatment for conditions other than malignant neoplasm: Secondary | ICD-10-CM | POA: Diagnosis not present

## 2022-04-18 DIAGNOSIS — D120 Benign neoplasm of cecum: Secondary | ICD-10-CM

## 2022-04-18 DIAGNOSIS — D122 Benign neoplasm of ascending colon: Secondary | ICD-10-CM

## 2022-04-18 DIAGNOSIS — Z86010 Personal history of colonic polyps: Secondary | ICD-10-CM

## 2022-04-18 DIAGNOSIS — I10 Essential (primary) hypertension: Secondary | ICD-10-CM | POA: Diagnosis not present

## 2022-04-18 DIAGNOSIS — D123 Benign neoplasm of transverse colon: Secondary | ICD-10-CM | POA: Diagnosis not present

## 2022-04-18 DIAGNOSIS — K7460 Unspecified cirrhosis of liver: Secondary | ICD-10-CM | POA: Diagnosis not present

## 2022-04-18 MED ORDER — SODIUM CHLORIDE 0.9 % IV SOLN: 500.0000 mL | INTRAVENOUS | Status: DC

## 2022-04-18 NOTE — Progress Notes (Signed)
Called to room to assist during endoscopic procedure.  Patient ID and intended procedure confirmed with present staff. Received instructions for my participation in the procedure from the performing physician.  

## 2022-04-18 NOTE — Progress Notes (Signed)
Report to PACU, RN, vss, BBS= Clear.  

## 2022-04-18 NOTE — Progress Notes (Signed)
HISTORY OF PRESENT ILLNESS:  Levi Conner is a 66 y.o. male with a history of multiple adenomas and sessile serrated polyps on colonoscopy 2013.  Now for follow-up.  Overdue for surveillance  REVIEW OF SYSTEMS:  All non-GI ROS negative.  Past Medical History:  Diagnosis Date   Anxiety    on meds   Arthritis    Borderline diabetic    diet controlled, pt does not check cbg   Colon polyps    Diabetes mellitus without complication (HCC)    no meds-diet controlled   Hemorrhoids    Hepatitis C    tumor on liver treated befor can treat Hep C   Hernia, inguinal, bilateral    current   Hypertension    on meds   Melanoma ([place])    Pneumonia 10/23/2011   HOSPITALIZED FOR PNEUMONIA MAY 2013.  PT HAS PULMONARY CLEARANCE FROM DR. WERT FOR HERNIA REPAIR WITH DR. Johney Maine   Pre-diabetes    Rash  since 7-8-201   red rash around bellybutton and belt line and both feet  - resolved    Past Surgical History:  Procedure Laterality Date   ESOPHAGOGASTRODUODENOSCOPY  03/26/2012   Procedure: ESOPHAGOGASTRODUODENOSCOPY (EGD);  Surgeon: Beryle Beams, MD;  Location: [facility] ENDOSCOPY;  Service: Endoscopy;  Laterality: N/A;   HERNIA REPAIR  01/30/2012   umb hernia   INGUINAL HERNIA REPAIR  01/30/2012   Procedure: LAPAROSCOPIC BILATERAL INGUINAL HERNIA REPAIR;  Surgeon: Adin Hector, MD;  Location: [facility];  Service: General;  Laterality: Bilateral;   IR RADIOLOGIST EVAL & MGMT  12/26/2020   IR RADIOLOGIST EVAL & MGMT  02/13/2021   IR RADIOLOGIST EVAL & MGMT  05/31/2021   IR RADIOLOGIST EVAL & MGMT  11/12/2021   IR RADIOLOGIST EVAL & MGMT  01/30/2022   IR RADIOLOGIST EVAL & MGMT  03/15/2022   RADIOLOGY WITH ANESTHESIA N/A 01/24/2021   Procedure: CT WITH ANESTHESIA-THERMAL ABLATION;  Surgeon: Aletta Edouard, MD;  Location: [facility];  Service: Radiology;  Laterality: N/A;   RADIOLOGY WITH ANESTHESIA N/A 01/02/2022   Procedure: MICROWAVE ABLATION OF LIVER;  Surgeon: Aletta Edouard, MD;  Location: [facility];   Service: Radiology;  Laterality: N/A;   UMBILICAL HERNIA REPAIR  01/30/2012   Procedure: HERNIA REPAIR UMBILICAL ADULT;  Surgeon: Adin Hector, MD;  Location: [facility];  Service: General;  Laterality: N/A;  Primary Umbilical Hernia Repair    Social History [name]  reports that he has been smoking cigarettes. He has a 15.00 pack-year smoking history. He has never used smokeless tobacco. He reports that he does not currently use alcohol. He reports that he does not use drugs.  family history is not on file.  Allergies  Allergen Reactions   Penicillins     Has patient had a PCN reaction causing immediate rash, facial/tongue/throat swelling, SOB or lightheadedness with hypotension: Unk Has patient had a PCN reaction causing severe rash involving mucus membranes or skin necrosis: Unk Has patient had a PCN reaction that required hospitalization: Unk Has patient had a PCN reaction occurring within the last 10 years: NO If all of the above answers are "NO", then may proceed with Cephalosporin use.   Sulfa Antibiotics Other (See Comments)    unknown       PHYSICAL EXAMINATION: Vital signs: BP 132/78   Pulse 84   Temp 98.2 F (36.8 C)   Resp 18   Ht '5\' 11"'$  (1.803 m)   Wt 137 lb (62.1 kg)  SpO2 97%   BMI 19.11 kg/m  General: Well-developed, well-nourished, no acute distress HEENT: Sclerae are anicteric, conjunctiva pink. Oral mucosa intact Lungs: Clear Heart: Regular Abdomen: soft, nontender, nondistended, no obvious ascites, no peritoneal signs, normal bowel sounds. No organomegaly. Extremities: No edema Psychiatric: alert and oriented x3. Cooperative      ASSESSMENT:  History of multiple adenomatous and sessile serrated polyps.   PLAN:   Surveillance colonoscopy

## 2022-04-18 NOTE — Op Note (Signed)
[facility] Patient Name: Levi Conner Procedure Date: 04/18/2022 1:47 PM MRN: [id number] Endoscopist: Docia Chuck. Henrene Pastor , MD, [id number] Age: 66 Referring MD:  Date of Birth: 05/15/1956 Gender: Male Account #: [id number] Procedure:                Colonoscopy with cold snare polypectomy x 4; biopsy                            polypectomy x 2 Indications:              High risk colon cancer surveillance: Personal                            history of multiple (3 or more) adenomas, High risk                            colon cancer surveillance: Personal history of                            sessile serrated colon polyp (less than 10 mm in                            size) with no dysplasia Medicines:                Monitored Anesthesia Care Procedure:                Pre-Anesthesia Assessment:                           - Prior to the procedure, a History and Physical                            was performed, and patient medications and                            allergies were reviewed. The patient's tolerance of                            previous anesthesia was also reviewed. The risks                            and benefits of the procedure and the sedation                            options and risks were discussed with the patient.                            All questions were answered, and informed consent                            was obtained. Prior Anticoagulants: The patient has                            taken no anticoagulant or antiplatelet agents. ASA  Grade Assessment: II - A patient with mild systemic                            disease. After reviewing the risks and benefits,                            the patient was deemed in satisfactory condition to                            undergo the procedure.                           After obtaining informed consent, the colonoscope                            was passed under direct vision.  Throughout the                            procedure, the patient's blood pressure, pulse, and                            oxygen saturations were monitored continuously. The                            Olympus CF-HQ190L [id number]) Colonoscope was                            introduced through the anus and advanced to the the                            cecum, identified by appendiceal orifice and                            ileocecal valve. The ileocecal valve, appendiceal                            orifice, and rectum were photographed. The quality                            of the bowel preparation was excellent. The                            colonoscopy was performed without difficulty. The                            patient tolerated the procedure well. The bowel                            preparation used was SUPREP via split dose                            instruction. Scope In: 1:58:43 PM Scope Out: 2:13:56 PM Scope Withdrawal Time: 0 hours 13 minutes 51 seconds  Total Procedure Duration: 0 hours 15 minutes 13 seconds  Findings:                 Four polyps were found in the ascending colon and                            cecum. The polyps were 2 to 7 mm in size. These                            polyps were removed with a cold snare. Resection                            and retrieval were complete.                           Two polyps were found in the hepatic flexure. The                            polyps were 1 to 2 mm in size. These polyps were                            removed with a jumbo cold forceps. Resection and                            retrieval were complete.                           A few diverticula were found in the sigmoid colon.                           The exam was otherwise without abnormality on                            direct and retroflexion views. Complications:            No immediate complications. Estimated blood loss:                             None. Estimated Blood Loss:     Estimated blood loss: none. Impression:               - Four 2 to 7 mm polyps in the ascending colon and                            in the cecum, removed with a cold snare. Resected                            and retrieved.                           - Two 1 to 2 mm polyps at the hepatic flexure,                            removed with a jumbo cold forceps. Resected and  retrieved.                           - Diverticulosis in the sigmoid colon.                           - The examination was otherwise normal on direct                            and retroflexion views. Recommendation:           - Repeat colonoscopy in 3 years for surveillance.                           - Patient has a contact number available for                            emergencies. The signs and symptoms of potential                            delayed complications were discussed with the                            patient. Return to normal activities tomorrow.                            Written discharge instructions were provided to the                            patient.                           - Resume previous diet.                           - Continue present medications.                           - Await pathology results. Docia Chuck. Henrene Pastor, MD 04/18/2022 2:21:54 PM This report has been signed electronically.

## 2022-04-18 NOTE — Progress Notes (Signed)
Pt's states no medical or surgical changes since previsit or office visit. 

## 2022-04-18 NOTE — Patient Instructions (Signed)
Await pathology results.  Handout on polyps and diverticulosis provided.  YOU HAD AN ENDOSCOPIC PROCEDURE TODAY AT THE [facility]:   Refer to the procedure report that was given to you for any specific questions about what was found during the examination.  If the procedure report does not answer your questions, please call your gastroenterologist to clarify.  If you requested that your care partner not be given the details of your procedure findings, then the procedure report has been included in a sealed envelope for you to review at your convenience later.  YOU SHOULD EXPECT: Some feelings of bloating in the abdomen. Passage of more gas than usual.  Walking can help get rid of the air that was put into your GI tract during the procedure and reduce the bloating. If you had a lower endoscopy (such as a colonoscopy or flexible sigmoidoscopy) you may notice spotting of blood in your stool or on the toilet paper. If you underwent a bowel prep for your procedure, you may not have a normal bowel movement for a few days.  Please Note:  You might notice some irritation and congestion in your nose or some drainage.  This is from the oxygen used during your procedure.  There is no need for concern and it should clear up in a day or so.  SYMPTOMS TO REPORT IMMEDIATELY:  Following lower endoscopy (colonoscopy or flexible sigmoidoscopy):  Excessive amounts of blood in the stool  Significant tenderness or worsening of abdominal pains  Swelling of the abdomen that is new, acute  Fever of 100F or higher   For urgent or emergent issues, a gastroenterologist can be reached at any hour by calling [phone]. Do not use MyChart messaging for urgent concerns.    DIET:  We do recommend a small meal at first, but then you may proceed to your regular diet.  Drink plenty of fluids but you should avoid alcoholic beverages for 24 hours.  ACTIVITY:  You should plan to take it easy for the rest of  today and you should NOT DRIVE or use heavy machinery until tomorrow (because of the sedation medicines used during the test).    FOLLOW UP: Our staff will call the number listed on your records the next business day following your procedure.  We will call around 7:15- 8:00 am to check on you and address any questions or concerns that you may have regarding the information given to you following your procedure. If we do not reach you, we will leave a message.     If any biopsies were taken you will be contacted by phone or by letter within the next 1-3 weeks.  Please call us at [phone] if you have not heard about the biopsies in 3 weeks.    SIGNATURES/CONFIDENTIALITY: You and/or your care partner have signed paperwork which will be entered into your electronic medical record.  These signatures attest to the fact that that the information above on your After Visit Summary has been reviewed and is understood.  Full responsibility of the confidentiality of this discharge information lies with you and/or your care-partner.  

## 2022-04-19 ENCOUNTER — Telehealth: Payer: Self-pay

## 2022-04-19 NOTE — Telephone Encounter (Signed)
  Follow up Call-      No data to display           Patient questions:  Do you have a fever, pain , or abdominal swelling? No. Pain Score  0 *  Have you tolerated food without any problems? Yes.    Have you been able to return to your normal activities? Yes.    Do you have any questions about your discharge instructions: Diet   No. Medications  No. Follow up visit  No.  Do you have questions or concerns about your Care? No.  Actions: * If pain score is 4 or above: No action needed, pain <4.

## 2022-04-25 ENCOUNTER — Encounter: Payer: Self-pay | Admitting: Internal Medicine

## 2022-07-12 ENCOUNTER — Other Ambulatory Visit: Payer: Self-pay | Admitting: Nurse Practitioner

## 2022-07-12 DIAGNOSIS — R1010 Upper abdominal pain, unspecified: Secondary | ICD-10-CM

## 2022-07-12 DIAGNOSIS — C220 Liver cell carcinoma: Secondary | ICD-10-CM

## 2022-07-12 DIAGNOSIS — K7469 Other cirrhosis of liver: Secondary | ICD-10-CM

## 2022-07-18 ENCOUNTER — Ambulatory Visit
Admission: RE | Admit: 2022-07-18 | Discharge: 2022-07-18 | Disposition: A | Discharge status: Home / Self Care | Payer: Medicare HMO | Source: Ambulatory Visit | Attending: Nurse Practitioner | Admitting: Nurse Practitioner

## 2022-07-18 DIAGNOSIS — K7469 Other cirrhosis of liver: Secondary | ICD-10-CM | POA: Diagnosis not present

## 2022-07-18 DIAGNOSIS — R160 Hepatomegaly, not elsewhere classified: Secondary | ICD-10-CM | POA: Diagnosis not present

## 2022-07-18 DIAGNOSIS — C220 Liver cell carcinoma: Secondary | ICD-10-CM | POA: Diagnosis not present

## 2022-07-18 DIAGNOSIS — R1010 Upper abdominal pain, unspecified: Secondary | ICD-10-CM

## 2022-07-18 DIAGNOSIS — Z8505 Personal history of malignant neoplasm of liver: Secondary | ICD-10-CM | POA: Diagnosis not present

## 2022-07-18 DIAGNOSIS — K820 Obstruction of gallbladder: Secondary | ICD-10-CM | POA: Diagnosis not present

## 2022-07-18 DIAGNOSIS — K7460 Unspecified cirrhosis of liver: Secondary | ICD-10-CM | POA: Diagnosis not present

## 2022-07-24 DIAGNOSIS — C220 Liver cell carcinoma: Secondary | ICD-10-CM | POA: Diagnosis not present

## 2022-07-24 DIAGNOSIS — I10 Essential (primary) hypertension: Secondary | ICD-10-CM | POA: Diagnosis not present

## 2022-07-24 DIAGNOSIS — K7469 Other cirrhosis of liver: Secondary | ICD-10-CM | POA: Diagnosis not present

## 2022-07-24 DIAGNOSIS — K766 Portal hypertension: Secondary | ICD-10-CM | POA: Diagnosis not present

## 2022-07-24 DIAGNOSIS — B182 Chronic viral hepatitis C: Secondary | ICD-10-CM | POA: Diagnosis not present

## 2022-10-25 ENCOUNTER — Other Ambulatory Visit: Payer: Self-pay | Admitting: Interventional Radiology

## 2022-10-25 DIAGNOSIS — C220 Liver cell carcinoma: Secondary | ICD-10-CM

## 2023-02-06 ENCOUNTER — Other Ambulatory Visit: Payer: Self-pay | Admitting: Interventional Radiology

## 2023-02-06 DIAGNOSIS — C220 Liver cell carcinoma: Secondary | ICD-10-CM

## 2023-02-22 ENCOUNTER — Ambulatory Visit (HOSPITAL_COMMUNITY)
Admission: RE | Admit: 2023-02-22 | Discharge: 2023-02-22 | Disposition: A | Discharge status: Home / Self Care | Payer: Medicare Other | Source: Ambulatory Visit | Attending: Interventional Radiology | Admitting: Interventional Radiology

## 2023-02-22 DIAGNOSIS — I700 Atherosclerosis of aorta: Secondary | ICD-10-CM | POA: Diagnosis not present

## 2023-02-22 DIAGNOSIS — R161 Splenomegaly, not elsewhere classified: Secondary | ICD-10-CM | POA: Diagnosis not present

## 2023-02-22 DIAGNOSIS — C220 Liver cell carcinoma: Secondary | ICD-10-CM | POA: Diagnosis not present

## 2023-02-22 DIAGNOSIS — K769 Liver disease, unspecified: Secondary | ICD-10-CM | POA: Diagnosis not present

## 2023-02-22 DIAGNOSIS — K7689 Other specified diseases of liver: Secondary | ICD-10-CM | POA: Diagnosis not present

## 2023-02-22 MED ORDER — GADOBUTROL 1 MMOL/ML IV SOLN
7.0000 mL | Freq: Once | INTRAVENOUS | Status: AC | PRN
  Administered 2023-02-22: 7 mL via INTRAVENOUS

## 2023-02-25 DIAGNOSIS — B182 Chronic viral hepatitis C: Secondary | ICD-10-CM | POA: Diagnosis not present

## 2023-02-25 DIAGNOSIS — C220 Liver cell carcinoma: Secondary | ICD-10-CM | POA: Diagnosis not present

## 2023-02-25 DIAGNOSIS — I10 Essential (primary) hypertension: Secondary | ICD-10-CM | POA: Diagnosis not present

## 2023-02-25 DIAGNOSIS — K7469 Other cirrhosis of liver: Secondary | ICD-10-CM | POA: Diagnosis not present

## 2023-02-25 DIAGNOSIS — K766 Portal hypertension: Secondary | ICD-10-CM | POA: Diagnosis not present

## 2023-02-26 ENCOUNTER — Ambulatory Visit: Admission: RE | Admit: 2023-02-26 | Payer: Medicare Other | Source: Ambulatory Visit

## 2023-02-26 DIAGNOSIS — C220 Liver cell carcinoma: Secondary | ICD-10-CM | POA: Diagnosis not present

## 2023-02-26 HISTORY — PX: IR RADIOLOGIST EVAL & MGMT: IMG5224

## 2023-02-26 NOTE — Progress Notes (Signed)
Chief Complaint: Patient was consulted remotely today (TeleHealth) for follow-up after thermal ablation of hepatocellular carcinoma.   History of Present Illness: Levi Conner is a 67 y.o. male status post percutaneous microwave thermal ablation of a 3 cm segment II hepatocellular carcinoma within the left lobe of the liver on 01/24/2021 and microwave ablation of a second 1.3 cm subcapsular hepatocellular carcinoma of the right lobe of the liver on 01/02/2022. Follow up MRI on 03/06/2022 showed no evidence of recurrent HCC. A follow up MRI was performed on 02/22/2023. Levi Conner is asymptomatic. He said he recently did not get his blood drawn at [facility] like he was supposed to but is planning on going today.  Past Medical History:  Diagnosis Date   Anxiety    on meds   Arthritis    Borderline diabetic    diet controlled, pt does not check cbg   Colon polyps    Diabetes mellitus without complication (HCC)    no meds-diet controlled   Hemorrhoids    Hepatitis C    tumor on liver treated befor can treat Hep C   Hernia, inguinal, bilateral    current   Hypertension    on meds   Melanoma (HCC)    Pneumonia 10/23/2011   HOSPITALIZED FOR PNEUMONIA MAY 2013.  PT HAS PULMONARY CLEARANCE FROM DR. WERT FOR HERNIA REPAIR WITH DR. Michaell Cowing   Pre-diabetes    Rash  since 7-8-201   red rash around bellybutton and belt line and both feet  - resolved    Past Surgical History:  Procedure Laterality Date   ESOPHAGOGASTRODUODENOSCOPY  03/26/2012   Procedure: ESOPHAGOGASTRODUODENOSCOPY (EGD);  Surgeon: Theda Belfast, MD;  Location: [facility] ENDOSCOPY;  Service: Endoscopy;  Laterality: N/A;   HERNIA REPAIR  01/30/2012   umb hernia   INGUINAL HERNIA REPAIR  01/30/2012   Procedure: LAPAROSCOPIC BILATERAL INGUINAL HERNIA REPAIR;  Surgeon: Ardeth Sportsman, MD;  Location: [facility] ORS;  Service: General;  Laterality: Bilateral;   IR RADIOLOGIST EVAL & MGMT  12/26/2020   IR RADIOLOGIST EVAL & MGMT   02/13/2021   IR RADIOLOGIST EVAL & MGMT  05/31/2021   IR RADIOLOGIST EVAL & MGMT  11/12/2021   IR RADIOLOGIST EVAL & MGMT  01/30/2022   IR RADIOLOGIST EVAL & MGMT  03/15/2022   RADIOLOGY WITH ANESTHESIA N/A 01/24/2021   Procedure: CT WITH ANESTHESIA-THERMAL ABLATION;  Surgeon: Irish Lack, MD;  Location: [facility] ORS;  Service: Radiology;  Laterality: N/A;   RADIOLOGY WITH ANESTHESIA N/A 01/02/2022   Procedure: MICROWAVE ABLATION OF LIVER;  Surgeon: Irish Lack, MD;  Location: [facility];  Service: Radiology;  Laterality: N/A;   UMBILICAL HERNIA REPAIR  01/30/2012   Procedure: HERNIA REPAIR UMBILICAL ADULT;  Surgeon: Ardeth Sportsman, MD;  Location: [facility]S;  Service: General;  Laterality: N/A;  Primary Umbilical Hernia Repair    Allergies: Penicillins and Sulfa antibiotics  Medications: Prior to Admission medications   Medication Sig Start Date End Date Taking? Authorizing Provider  acetaminophen (TYLENOL) 650 MG CR tablet Take 650 mg by mouth every 8 (eight) hours as needed for pain.    [provider]  losartan (COZAAR) 100 MG tablet Take 100 mg by mouth daily. Patient not taking: Reported on 04/18/2022 10/26/21   [provider]  sertraline (ZOLOFT) 25 MG tablet Take 25 mg by mouth daily. 03/19/22   [provider]  Sulfacetamide Sodium-Sulfur 10-5 % CREA Apply 1 Application topically daily. 09/29/21   [provider]  tamsulosin (FLOMAX) 0.4 MG CAPS capsule Take 0.4 mg by mouth daily. Patient not taking: Reported on 04/18/2022 03/23/22   [provider]  zolpidem (AMBIEN) 5 MG tablet Take 5 mg by mouth at bedtime as needed. Patient not taking: Reported on 04/18/2022 03/15/22   [provider]     Family History  Problem Relation Age of Onset   Colon polyps Neg Hx    Colon cancer Neg Hx    Esophageal cancer Neg Hx    Prostate cancer Neg Hx    Stomach cancer Neg Hx    Rectal cancer Neg Hx     Social History   Socioeconomic History    Marital status: Single    Spouse name: Not on file   Number of children: Not on file   Years of education: Not on file   Highest education level: Not on file  Occupational History   Occupation: Holiday representative  Tobacco Use   Smoking status: Some Days    Current packs/day: 0.50    Average packs/day: 0.5 packs/day for 30.0 years (15.0 ttl pk-yrs)    Types: Cigarettes   Smokeless tobacco: Never  Vaping Use   Vaping status: Never Used  Substance and Sexual Activity   Alcohol use: Not Currently    Alcohol/week: 0.0 - 2.0 standard drinks of alcohol   Drug use: No   Sexual activity: Not Currently  Other Topics Concern   Not on file  Social History Narrative   Is concerned about a significant mold problem in his friend's home where he is living.    Social Determinants of Health   Financial Resource Strain: Medium Risk (01/21/2022)   [facility]   Overall Financial Resource Strain (CARDIA)    Difficulty of Paying Living Expenses: Somewhat hard  Food Insecurity: Food Insecurity Present (01/21/2022)   [facility]   Hunger Vital Sign    Worried About Running Out of Food in the Last Year: Never true    Ran Out of Food in the Last Year: Sometimes true  Transportation Needs: No Transportation Needs (01/21/2022)   Received from [employer], [facility]   PRAPARE - Transportation    Lack of Transportation (Medical): No    Lack of Transportation (Non-Medical): No  Physical Activity: Not on file  Stress: Not on file  Social Connections: Not on file    ECOG Status: 0 - Asymptomatic  Review of Systems  Constitutional: Negative.   Respiratory: Negative.    Cardiovascular: Negative.   Gastrointestinal: Negative.   Genitourinary: Negative.   Musculoskeletal: Negative.   Neurological: Negative.   Hematological: Negative.     Review of Systems: A 12 point ROS discussed and pertinent positives are indicated in the HPI above.   All other systems are negative.   Physical Exam No direct physical exam was performed (except for noted visual exam findings with Video Visits).   Vital Signs: There were no vitals taken for this visit.  Imaging: MR ABDOMEN WWO CONTRAST  Result Date: 02/22/2023 CLINICAL DATA:  Follow up thermal ablation left hepatic lesion 2022. Right hepatic lesion in 2023. EXAM: MRI ABDOMEN WITHOUT AND WITH CONTRAST TECHNIQUE: Multiplanar multisequence MR imaging of the abdomen was performed both before and after the administration of intravenous contrast. CONTRAST:  7mL GADAVIST GADOBUTROL 1 MMOL/ML IV SOLN COMPARISON:  MRI 03/06/2022 and older FINDINGS: Lower chest: Lung bases are clear.  No pleural effusion. Hepatobiliary: Liver has a slightly nodular contour. Parenchyma is  slightly micronodular on delayed there are areas of lattice like areas of enhancement consistent with components of fibrosis. Patent portal vein. No intrahepatic biliary ductal dilatation. Gallbladder is nondilated. As described previously there are 2 areas of ablation change. The larger focus is seen in segment 2 anteriorly. The focal defect in this location today measures 2.3 by 1.7 cm and previously 2.4 by 1.8 cm measured in the same fashion. This has some central bright precontrast T1 and dark T2 heterogeneous signal. On the dynamic there is no true arterial enhancement. There is slight nodular enhancement along the anterior inferior aspect of the defect on the delayed dataset which could be some areas of developing fibrosis or regeneration of liver parenchyma. Associated peripheral areas of biliary ductal dilatation identified. Thin fibrotic capsule. Second lesion identified along the peripheral aspect of segment 4. This also does not demonstrate restricted diffusion, central arterial enhancement. Slight capsular retraction with a thin delayed enhancing fibrotic margin. Defect today measures 12 mm in maximum dimension in the axial plane and  previously 13 mm. In segment 7 on arterial phase series 11, image 31 is a small rounded area of enhancement measuring 6 mm. This lesion does not show restricted diffusion. This not seen on other series including later phases of the dynamic postcontrast in certainty. Pancreas: Preserved pancreatic parenchyma. Congenital variant of pancreatic divisum. There is a small cystic area seen on series 5, image 17 towards the tail anteriorly measuring 5 mm. Unchanged from prior in retrospect. Simple attention on follow-up for the patient's ablation Spleen: Mildly enlarged at 13.9 cm in cephalocaudal length. No restricted diffusion. Adrenals/Urinary Tract: No masses identified. No evidence of hydronephrosis. Stomach/Bowel: Visualized portions within the abdomen are unremarkable. Vascular/Lymphatic: Atherosclerotic changes along the aorta. Normal caliber aorta and IVC. No specific abnormal lymph node enlargement identified in the upper abdomen. Other:  No ascites. Musculoskeletal: Mild degenerative changes along the spine. IMPRESSION: Maturing post ablation changes in the liver in segment 2 and 4. No developing abnormal enhancement in these locations. Again changes of chronic liver disease with micronodular liver with areas of lattice like fibrosis. Splenomegaly. No ascites. Patent portal vein. New 6 mm area of nodular hyperenhancement in segment 7 without visualization on other series this is of uncertain etiology. Recommend follow up surveillance in 3-6 months. 5 mm cystic focus along the body/tail of the pancreas. Follow up evaluation in 1 year Electronically Signed   By: Karen Kays M.D.   On: 02/22/2023 11:17     Assessment and Plan:  I spoke with Levi Conner over the phone. I reviewed MRI findings with him from 02/22/23 which demonstrate further retraction of left and right lobe ablation defects and no evidence of recurrent enhancing carcinoma at the treated sites. Within segment VII in the right lobe, a small area  of new arterial enhancement measuring 6 mm was seen only on the early 30 second arterial phase after contrast and not on diffusion, unenhanced, or other post contrast sequences. This does not meet criteria for [facility] by MRI, but given that this enhancement is new, will be followed closely. I recommended that we perform a follow up MRI in 6 months rather than a year, and follow AFP levels. We will schedule a follow up MRI and I will meet back with him after the study is performed.   Electronically Signed: Reola Calkins 02/26/2023, 2:20 PM    I spent a total of  10 Minutes in remote  clinical consultation, greater than 50% of which was  counseling/coordinating care post ablation of hepatocellular carcinomas.    Visit type: Audio only (telephone). Audio (no video) only due to patient's lack of internet/smartphone capability. Alternative for in-person consultation at [facility], [address] [place], [place], [place]. This visit type was conducted due to national recommendations for restrictions regarding the COVID-19 Pandemic (e.g. social distancing).  This format is felt to be most appropriate for this patient at this time.  All issues noted in this document were discussed and addressed.

## 2023-03-17 DIAGNOSIS — K7469 Other cirrhosis of liver: Secondary | ICD-10-CM | POA: Diagnosis not present

## 2023-03-17 DIAGNOSIS — B182 Chronic viral hepatitis C: Secondary | ICD-10-CM | POA: Diagnosis not present

## 2023-03-17 DIAGNOSIS — K766 Portal hypertension: Secondary | ICD-10-CM | POA: Diagnosis not present

## 2023-03-17 DIAGNOSIS — I10 Essential (primary) hypertension: Secondary | ICD-10-CM | POA: Diagnosis not present

## 2023-03-17 DIAGNOSIS — C220 Liver cell carcinoma: Secondary | ICD-10-CM | POA: Diagnosis not present

## 2023-07-08 DIAGNOSIS — Z125 Encounter for screening for malignant neoplasm of prostate: Secondary | ICD-10-CM | POA: Diagnosis not present

## 2023-07-08 DIAGNOSIS — K7460 Unspecified cirrhosis of liver: Secondary | ICD-10-CM | POA: Diagnosis not present

## 2023-07-08 DIAGNOSIS — C220 Liver cell carcinoma: Secondary | ICD-10-CM | POA: Diagnosis not present

## 2023-07-08 DIAGNOSIS — R351 Nocturia: Secondary | ICD-10-CM | POA: Diagnosis not present

## 2023-07-08 DIAGNOSIS — E1169 Type 2 diabetes mellitus with other specified complication: Secondary | ICD-10-CM | POA: Diagnosis not present

## 2023-07-08 DIAGNOSIS — N529 Male erectile dysfunction, unspecified: Secondary | ICD-10-CM | POA: Diagnosis not present

## 2023-07-08 DIAGNOSIS — Z1331 Encounter for screening for depression: Secondary | ICD-10-CM | POA: Diagnosis not present

## 2023-07-08 DIAGNOSIS — Z9181 History of falling: Secondary | ICD-10-CM | POA: Diagnosis not present

## 2023-07-08 DIAGNOSIS — Z139 Encounter for screening, unspecified: Secondary | ICD-10-CM | POA: Diagnosis not present

## 2023-07-08 DIAGNOSIS — I10 Essential (primary) hypertension: Secondary | ICD-10-CM | POA: Diagnosis not present

## 2023-07-08 DIAGNOSIS — I8510 Secondary esophageal varices without bleeding: Secondary | ICD-10-CM | POA: Diagnosis not present

## 2023-07-08 DIAGNOSIS — K766 Portal hypertension: Secondary | ICD-10-CM | POA: Diagnosis not present

## 2023-07-08 DIAGNOSIS — B182 Chronic viral hepatitis C: Secondary | ICD-10-CM | POA: Diagnosis not present

## 2023-08-21 DIAGNOSIS — M62830 Muscle spasm of back: Secondary | ICD-10-CM | POA: Diagnosis not present

## 2023-08-21 DIAGNOSIS — R82998 Other abnormal findings in urine: Secondary | ICD-10-CM | POA: Diagnosis not present

## 2023-08-21 DIAGNOSIS — F419 Anxiety disorder, unspecified: Secondary | ICD-10-CM | POA: Diagnosis not present

## 2023-08-21 DIAGNOSIS — Z23 Encounter for immunization: Secondary | ICD-10-CM | POA: Diagnosis not present

## 2023-08-21 DIAGNOSIS — C44612 Basal cell carcinoma of skin of right upper limb, including shoulder: Secondary | ICD-10-CM | POA: Diagnosis not present

## 2023-08-21 DIAGNOSIS — R040 Epistaxis: Secondary | ICD-10-CM | POA: Diagnosis not present

## 2023-08-27 ENCOUNTER — Other Ambulatory Visit: Payer: Self-pay | Admitting: Nurse Practitioner

## 2023-08-27 DIAGNOSIS — K766 Portal hypertension: Secondary | ICD-10-CM | POA: Diagnosis not present

## 2023-08-27 DIAGNOSIS — K7469 Other cirrhosis of liver: Secondary | ICD-10-CM

## 2023-08-27 DIAGNOSIS — C220 Liver cell carcinoma: Secondary | ICD-10-CM | POA: Diagnosis not present

## 2023-09-01 DIAGNOSIS — J3489 Other specified disorders of nose and nasal sinuses: Secondary | ICD-10-CM | POA: Diagnosis not present

## 2023-09-01 DIAGNOSIS — J018 Other acute sinusitis: Secondary | ICD-10-CM | POA: Diagnosis not present

## 2023-09-01 DIAGNOSIS — R040 Epistaxis: Secondary | ICD-10-CM | POA: Diagnosis not present

## 2023-09-22 DIAGNOSIS — J018 Other acute sinusitis: Secondary | ICD-10-CM | POA: Diagnosis not present

## 2023-09-22 DIAGNOSIS — J3489 Other specified disorders of nose and nasal sinuses: Secondary | ICD-10-CM | POA: Diagnosis not present

## 2023-09-28 ENCOUNTER — Other Ambulatory Visit

## 2023-10-09 DIAGNOSIS — L814 Other melanin hyperpigmentation: Secondary | ICD-10-CM | POA: Diagnosis not present

## 2023-10-09 DIAGNOSIS — D492 Neoplasm of unspecified behavior of bone, soft tissue, and skin: Secondary | ICD-10-CM | POA: Diagnosis not present

## 2023-10-09 DIAGNOSIS — C44629 Squamous cell carcinoma of skin of left upper limb, including shoulder: Secondary | ICD-10-CM | POA: Diagnosis not present

## 2023-10-16 ENCOUNTER — Encounter: Payer: Self-pay | Admitting: Nurse Practitioner

## 2023-10-16 DIAGNOSIS — K7469 Other cirrhosis of liver: Secondary | ICD-10-CM | POA: Diagnosis not present

## 2023-10-16 DIAGNOSIS — C220 Liver cell carcinoma: Secondary | ICD-10-CM | POA: Diagnosis not present

## 2023-10-16 DIAGNOSIS — K766 Portal hypertension: Secondary | ICD-10-CM | POA: Diagnosis not present

## 2023-10-22 ENCOUNTER — Encounter: Payer: Self-pay | Admitting: Nurse Practitioner

## 2023-10-26 ENCOUNTER — Ambulatory Visit
Admission: RE | Admit: 2023-10-26 | Discharge: 2023-10-26 | Disposition: A | Discharge status: Home / Self Care | Source: Ambulatory Visit | Attending: Nurse Practitioner

## 2023-10-26 DIAGNOSIS — K7460 Unspecified cirrhosis of liver: Secondary | ICD-10-CM | POA: Diagnosis not present

## 2023-10-26 DIAGNOSIS — R161 Splenomegaly, not elsewhere classified: Secondary | ICD-10-CM | POA: Diagnosis not present

## 2023-10-26 DIAGNOSIS — C220 Liver cell carcinoma: Secondary | ICD-10-CM

## 2023-10-26 DIAGNOSIS — K7469 Other cirrhosis of liver: Secondary | ICD-10-CM

## 2023-10-26 MED ORDER — GADOPICLENOL 0.5 MMOL/ML IV SOLN
7.5000 mL | Freq: Once | INTRAVENOUS | Status: AC | PRN
  Administered 2023-10-26: 7.5 mL via INTRAVENOUS

## 2023-10-28 DIAGNOSIS — E1121 Type 2 diabetes mellitus with diabetic nephropathy: Secondary | ICD-10-CM | POA: Diagnosis not present

## 2023-10-28 DIAGNOSIS — N529 Male erectile dysfunction, unspecified: Secondary | ICD-10-CM | POA: Diagnosis not present

## 2023-10-28 DIAGNOSIS — R410 Disorientation, unspecified: Secondary | ICD-10-CM | POA: Diagnosis not present

## 2023-10-28 DIAGNOSIS — I10 Essential (primary) hypertension: Secondary | ICD-10-CM | POA: Diagnosis not present

## 2023-10-28 DIAGNOSIS — R519 Headache, unspecified: Secondary | ICD-10-CM | POA: Diagnosis not present

## 2023-10-28 DIAGNOSIS — E782 Mixed hyperlipidemia: Secondary | ICD-10-CM | POA: Diagnosis not present

## 2023-10-31 NOTE — Telephone Encounter (Signed)
 The patient called back. Dawn has reached out to him multiple times. She is out of the office today. I reviewed her detailed result message with him. He verbalized understanding. Says this is just something I have to deal with now and I trust my doctors to do what is best for me. He is agreeable to contact Dr. Marcey Moan and I provided him with the phone number. I ask if he would like Dawn to call him back when she returns. He declined stating he is comfortable with the information received.

## 2023-11-07 DIAGNOSIS — R410 Disorientation, unspecified: Secondary | ICD-10-CM | POA: Diagnosis not present

## 2023-11-18 DIAGNOSIS — H109 Unspecified conjunctivitis: Secondary | ICD-10-CM | POA: Diagnosis not present

## 2023-11-18 DIAGNOSIS — J342 Deviated nasal septum: Secondary | ICD-10-CM | POA: Diagnosis not present

## 2023-11-19 DIAGNOSIS — H109 Unspecified conjunctivitis: Secondary | ICD-10-CM | POA: Diagnosis not present

## 2023-12-05 DIAGNOSIS — C44622 Squamous cell carcinoma of skin of right upper limb, including shoulder: Secondary | ICD-10-CM | POA: Diagnosis not present

## 2024-01-02 ENCOUNTER — Other Ambulatory Visit: Payer: Self-pay | Admitting: Interventional Radiology

## 2024-01-02 DIAGNOSIS — C220 Liver cell carcinoma: Secondary | ICD-10-CM

## 2024-01-12 ENCOUNTER — Ambulatory Visit
Admission: RE | Admit: 2024-01-12 | Discharge: 2024-01-12 | Disposition: A | Discharge status: Home / Self Care | Source: Ambulatory Visit | Attending: Interventional Radiology | Admitting: Interventional Radiology

## 2024-01-12 DIAGNOSIS — C220 Liver cell carcinoma: Secondary | ICD-10-CM

## 2024-01-12 HISTORY — PX: IR RADIOLOGIST EVAL & MGMT: IMG5224

## 2024-01-12 NOTE — Progress Notes (Signed)
 Chief Complaint: Patient was consulted remotely today (TeleHealth) for follow-up after thermal ablation of hepatocellular carcinoma.   History of Present Illness: Levi Conner is a 68 y.o. male status post percutaneous microwave thermal ablation of a 3 cm segment II hepatocellular carcinoma within the left lobe of the liver on 01/24/2021 and microwave ablation of a second 1.3 cm subcapsular hepatocellular carcinoma of the right lobe of the liver on 01/02/2022. Follow up MRI on 03/06/22 showed no evidence of recurrent disease. Follow up MRI on 02/22/23 showed a new 6 mm area of enhancement in segment VII of the right lobe. Additional follow up MRI was performed on 10/26/23. AFP on 10/16/23 was 7.9. He is asymptomatic and still working full time.  Past Medical History:  Diagnosis Date   Anxiety    on meds   Arthritis    Borderline diabetic    diet controlled, pt does not check cbg   Colon polyps    Diabetes mellitus without complication (HCC)    no meds-diet controlled   Hemorrhoids    Hepatitis C    tumor on liver treated befor can treat Hep C   Hernia, inguinal, bilateral    current   Hypertension    on meds   Melanoma (HCC)    Pneumonia 10/23/2011   HOSPITALIZED FOR PNEUMONIA MAY 2013.  PT HAS PULMONARY CLEARANCE FROM DR. WERT FOR HERNIA REPAIR WITH DR. SHELDON   Pre-diabetes    Rash  since 7-8-201   red rash around bellybutton and belt line and both feet  - resolved    Past Surgical History:  Procedure Laterality Date   ESOPHAGOGASTRODUODENOSCOPY  03/26/2012   Procedure: ESOPHAGOGASTRODUODENOSCOPY (EGD);  Surgeon: Belvie JONETTA Just, MD;  Location: [facility] ENDOSCOPY;  Service: Endoscopy;  Laterality: N/A;   HERNIA REPAIR  01/30/2012   umb hernia   INGUINAL HERNIA REPAIR  01/30/2012   Procedure: LAPAROSCOPIC BILATERAL INGUINAL HERNIA REPAIR;  Surgeon: Elspeth KYM SHELDON, MD;  Location: [facility];  Service: General;  Laterality: Bilateral;   IR RADIOLOGIST EVAL & MGMT  12/26/2020   IR  RADIOLOGIST EVAL & MGMT  02/13/2021   IR RADIOLOGIST EVAL & MGMT  05/31/2021   IR RADIOLOGIST EVAL & MGMT  11/12/2021   IR RADIOLOGIST EVAL & MGMT  01/30/2022   IR RADIOLOGIST EVAL & MGMT  03/15/2022   IR RADIOLOGIST EVAL & MGMT  02/26/2023   RADIOLOGY WITH ANESTHESIA N/A 01/24/2021   Procedure: CT WITH ANESTHESIA-THERMAL ABLATION;  Surgeon: Luverne Aran, MD;  Location: [facility];  Service: Radiology;  Laterality: N/A;   RADIOLOGY WITH ANESTHESIA N/A 01/02/2022   Procedure: MICROWAVE ABLATION OF LIVER;  Surgeon: Luverne Aran, MD;  Location: [facility];  Service: Radiology;  Laterality: N/A;   UMBILICAL HERNIA REPAIR  01/30/2012   Procedure: HERNIA REPAIR UMBILICAL ADULT;  Surgeon: Elspeth KYM SHELDON, MD;  Location: [facility];  Service: General;  Laterality: N/A;  Primary Umbilical Hernia Repair    Allergies: Penicillins and Sulfa antibiotics  Medications: Prior to Admission medications   Medication Sig Start Date End Date Taking? Authorizing Provider  acetaminophen  (TYLENOL ) 650 MG CR tablet Take 650 mg by mouth every 8 (eight) hours as needed for pain.    [provider]  losartan  (COZAAR ) 100 MG tablet Take 100 mg by mouth daily. Patient not taking: Reported on 04/18/2022 10/26/21   [provider]  sertraline (ZOLOFT) 25 MG tablet Take 25 mg by mouth daily. 03/19/22   [provider]  Sulfacetamide  Sodium-Sulfur   10-5 % CREA Apply 1 Application topically daily. 09/29/21   [provider]  tamsulosin (FLOMAX) 0.4 MG CAPS capsule Take 0.4 mg by mouth daily. Patient not taking: Reported on 04/18/2022 03/23/22   [provider]  zolpidem (AMBIEN) 5 MG tablet Take 5 mg by mouth at bedtime as needed. Patient not taking: Reported on 04/18/2022 03/15/22   [provider]     Family History  Problem Relation Age of Onset   Colon polyps Neg Hx    Colon cancer Neg Hx    Esophageal cancer Neg Hx    Prostate cancer Neg Hx    Stomach cancer Neg Hx    Rectal  cancer Neg Hx     Social History   Socioeconomic History   Marital status: Single    Spouse name: Not on file   Number of children: Not on file   Years of education: Not on file   Highest education level: Not on file  Occupational History   Occupation: Holiday representative  Tobacco Use   Smoking status: Some Days    Current packs/day: 0.50    Average packs/day: 0.5 packs/day for 30.0 years (15.0 ttl pk-yrs)    Types: Cigarettes   Smokeless tobacco: Never  Vaping Use   Vaping status: Never Used  Substance and Sexual Activity   Alcohol use: Not Currently    Alcohol/week: 0.0 - 2.0 standard drinks of alcohol   Drug use: No   Sexual activity: Not Currently  Other Topics Concern   Not on file  Social History Narrative   Is concerned about a significant mold problem in his friend's home where he is living.    Social Drivers of Health   Financial Resource Strain: Medium Risk (01/21/2022)   Received from [facility]   Overall Financial Resource Strain (CARDIA)    Difficulty of Paying Living Expenses: Somewhat hard  Food Insecurity: Low Risk  (08/27/2023)   Received from [facility]   Hunger Vital Sign    Within the past 12 months, you worried that your food would run out before you got money to buy more: Never true    Within the past 12 months, the food you bought just didn't last and you didn't have money to get more. : Never true  Transportation Needs: No Transportation Needs (08/27/2023)   Received from [employer]    In the past 12 months, has lack of reliable transportation kept you from medical appointments, meetings, work or from getting things needed for daily living? : No  Physical Activity: Not on file  Stress: Not on file  Social Connections: Not on file    ECOG Status: 0 - Asymptomatic  Review of Systems  Constitutional: Negative.   Respiratory: Negative.    Cardiovascular: Negative.   Gastrointestinal: Negative.   Genitourinary: Negative.    Musculoskeletal: Negative.   Neurological: Negative.     Review of Systems: A 12 point ROS discussed and pertinent positives are indicated in the HPI above.  All other systems are negative.   Physical Exam No direct physical exam was performed (except for noted visual exam findings with Video Visits).    Assessment and Plan:  I spoke with Mr. Jiles and reviewed the latest MRI findings with him. There is now a more convincing area of abnormal nodular enhancement in the far tip of segment II in the left lobe measuring up to 11-12 mm. There may also be some subtle nodular enhancement along the posterior margin  of prior ablation in the left lobe measuring up to 8 mm. The prior ablation site in the peripheral right lobe shows no evidence of recurrence. The prior 6 mm area of nodular enhancement in segment VI is actually likely in segment VII by current MRI and may be slightly larger, measuring 8 mm.  By current MRI, these nodular areas are suspicious for small foci of HCC. Given small size, all of these would likely be difficult to currently treat by image guidance by either US  or CT. I suggested we obtain an interval 6 month follow up MRI and if all are enlarging, try to treat them all with thermal ablation in one setting. Mr. Brobeck is in agreement with this plan.    Electronically Signed: Marcey ONEIDA Moan 01/12/2024, 11:27 AM    I spent a total of  15 Minutes in remote  clinical consultation, greater than 50% of which was counseling/coordinating care for hepatocellular carcinoma.    Visit type: Audio only (telephone). Audio (no video) only due to patient's lack of internet/smartphone capability. Alternative for in-person consultation at [facility], [address] [place], [place], [place]. This visit type was conducted due to national recommendations for restrictions regarding the COVID-19 Pandemic (e.g. social distancing).  This format is felt to be most appropriate for this  patient at this time.  All issues noted in this document were discussed and addressed.

## 2024-05-19 ENCOUNTER — Other Ambulatory Visit: Payer: Self-pay | Admitting: Nurse Practitioner

## 2024-05-19 DIAGNOSIS — K766 Portal hypertension: Secondary | ICD-10-CM | POA: Diagnosis not present

## 2024-05-19 DIAGNOSIS — E440 Moderate protein-calorie malnutrition: Secondary | ICD-10-CM

## 2024-05-19 DIAGNOSIS — H539 Unspecified visual disturbance: Secondary | ICD-10-CM | POA: Diagnosis not present

## 2024-05-19 DIAGNOSIS — C220 Liver cell carcinoma: Secondary | ICD-10-CM | POA: Diagnosis not present

## 2024-05-19 DIAGNOSIS — I10 Essential (primary) hypertension: Secondary | ICD-10-CM | POA: Diagnosis not present

## 2024-05-19 DIAGNOSIS — R197 Diarrhea, unspecified: Secondary | ICD-10-CM | POA: Diagnosis not present

## 2024-05-19 DIAGNOSIS — K7469 Other cirrhosis of liver: Secondary | ICD-10-CM

## 2024-05-27 DIAGNOSIS — E782 Mixed hyperlipidemia: Secondary | ICD-10-CM | POA: Diagnosis not present

## 2024-05-27 DIAGNOSIS — R351 Nocturia: Secondary | ICD-10-CM | POA: Diagnosis not present

## 2024-05-27 DIAGNOSIS — F419 Anxiety disorder, unspecified: Secondary | ICD-10-CM | POA: Diagnosis not present

## 2024-05-27 DIAGNOSIS — H543 Unqualified visual loss, both eyes: Secondary | ICD-10-CM | POA: Diagnosis not present

## 2024-05-27 DIAGNOSIS — K7460 Unspecified cirrhosis of liver: Secondary | ICD-10-CM | POA: Diagnosis not present

## 2024-05-27 DIAGNOSIS — I10 Essential (primary) hypertension: Secondary | ICD-10-CM | POA: Diagnosis not present

## 2024-05-27 DIAGNOSIS — E1121 Type 2 diabetes mellitus with diabetic nephropathy: Secondary | ICD-10-CM | POA: Diagnosis not present

## 2024-05-27 DIAGNOSIS — I8510 Secondary esophageal varices without bleeding: Secondary | ICD-10-CM | POA: Diagnosis not present

## 2024-05-27 DIAGNOSIS — N529 Male erectile dysfunction, unspecified: Secondary | ICD-10-CM | POA: Diagnosis not present

## 2024-06-01 ENCOUNTER — Ambulatory Visit: Admitting: Gastroenterology

## 2024-06-01 ENCOUNTER — Encounter: Payer: Self-pay | Admitting: Gastroenterology

## 2024-06-01 VITALS — BP 132/80 | HR 84 | Ht 71.0 in | Wt 143.0 lb

## 2024-06-01 DIAGNOSIS — K7030 Alcoholic cirrhosis of liver without ascites: Secondary | ICD-10-CM

## 2024-06-01 DIAGNOSIS — R194 Change in bowel habit: Secondary | ICD-10-CM

## 2024-06-01 DIAGNOSIS — Z860100 Personal history of colon polyps, unspecified: Secondary | ICD-10-CM

## 2024-06-01 DIAGNOSIS — F17200 Nicotine dependence, unspecified, uncomplicated: Secondary | ICD-10-CM

## 2024-06-01 DIAGNOSIS — C220 Liver cell carcinoma: Secondary | ICD-10-CM

## 2024-06-01 NOTE — Progress Notes (Signed)
 Chief Complaint: Diarrhea Primary GI MD: Dr. 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 Abran 12/2020.  Please review this note for further details  History of GERD with esophagitis and peptic stricture complicated by food impaction requiring emergent endoscopic intervention 2013  Discussed the use of AI scribe software for clinical note transcription with the patient, who gave verbal consent to proceed.  History of Present Illness   He experiences bowel problems characterized by alternating between watery and solid stools, with approximately two to three bowel movements a day, increasing on bad days. The stools are sometimes watery and sometimes have a 'little grit' in them. He attributes some of these issues to his diet, which he is in the process of changing to include 80 grams of protein and 2700 calories a day.  He reports bladder control issues. He is not taking his medication, Flomax, which he associates with his bladder issues.  He has a history of cirrhosis and has not taken his prescribed carvedilol for the past two weeks due to it making him feel sick. He recalls being advised to take it with a meal rather than just a small snack.  He had colonoscopy in 2023 and was supposed to have an endoscopy but this was not done due to financial reasons.   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   He consumes alcohol daily and when asked how many beers he consumes he states as many as I want and when asked further to quantify when given the option of a range 2-20 he said let's go with 2 per day then    PREVIOUS GI WORKUP   Colonoscopy 03/2022 - Four 2 to 7 mm polyps in the ascending colon and in the cecum, removed with a cold snare. Resected and  retrieved.  - Two 1 to 2 mm polyps at the hepatic flexure, removed with a jumbo cold forceps. Resected and retrieved.  - Diverticulosis in the sigmoid colon.  - The examination was otherwise normal on direct and retroflexion views.  Past Medical History:  Diagnosis Date   Anxiety    on meds   Arthritis    Borderline diabetic    diet controlled, pt does not check cbg   Cirrhosis, alcoholic (HCC)    secondary to Hep C   Colon polyps    Diabetes mellitus without complication (HCC)    no meds-diet controlled   Hemorrhoids    Hepatitis C    tumor on liver treated befor can treat Hep C   Hernia, inguinal, bilateral    current   Hypertension    on meds   Liver cancer (HCC)    Melanoma (HCC)    Pneumonia 10/23/2011   HOSPITALIZED FOR PNEUMONIA MAY 2013.  PT HAS PULMONARY CLEARANCE FROM DR. WERT FOR HERNIA REPAIR WITH DR. SHELDON   Pre-diabetes    Rash  since 7-8-201   red rash around bellybutton and belt line and both feet  - resolved    Past Surgical History:  Procedure Laterality Date   ESOPHAGOGASTRODUODENOSCOPY  03/26/2012   Procedure: ESOPHAGOGASTRODUODENOSCOPY (EGD);  Surgeon: Belvie JONETTA Just, MD;  Location: [facility] ENDOSCOPY;  Service: Endoscopy;  Laterality: N/A;   HERNIA REPAIR  01/30/2012   umb hernia   INGUINAL HERNIA REPAIR  01/30/2012  Procedure: LAPAROSCOPIC BILATERAL INGUINAL HERNIA REPAIR;  Surgeon: Elspeth KYM Schultze, MD;  Location: [facility] ORS;  Service: General;  Laterality: Bilateral;   IR RADIOLOGIST EVAL & MGMT  12/26/2020   IR RADIOLOGIST EVAL & MGMT  02/13/2021   IR RADIOLOGIST EVAL & MGMT  05/31/2021   IR RADIOLOGIST EVAL & MGMT  11/12/2021   IR RADIOLOGIST EVAL & MGMT  01/30/2022   IR RADIOLOGIST EVAL & MGMT  03/15/2022   IR RADIOLOGIST EVAL & MGMT  02/26/2023   IR RADIOLOGIST EVAL & MGMT  01/12/2024   RADIOLOGY WITH ANESTHESIA N/A 01/24/2021   Procedure: CT WITH ANESTHESIA-THERMAL ABLATION;  Surgeon: Luverne Aran, MD;  Location: [facility];  Service: Radiology;  Laterality:  N/A;   RADIOLOGY WITH ANESTHESIA N/A 01/02/2022   Procedure: MICROWAVE ABLATION OF LIVER;  Surgeon: Luverne Aran, MD;  Location: [facility];  Service: Radiology;  Laterality: N/A;   UMBILICAL HERNIA REPAIR  01/30/2012   Procedure: HERNIA REPAIR UMBILICAL ADULT;  Surgeon: Elspeth KYM Schultze, MD;  Location: [facility];  Service: General;  Laterality: N/A;  Primary Umbilical Hernia Repair    Current Outpatient Medications  Medication Sig Dispense Refill   acetaminophen  (TYLENOL ) 650 MG CR tablet Take 650 mg by mouth every 8 (eight) hours as needed for pain.     losartan  (COZAAR ) 100 MG tablet Take 100 mg by mouth daily.     tamsulosin (FLOMAX) 0.4 MG CAPS capsule Take 0.4 mg by mouth.     sertraline (ZOLOFT) 25 MG tablet Take 25 mg by mouth daily. (Patient not taking: Reported on 06/01/2024)     Sulfacetamide  Sodium-Sulfur  10-5 % CREA Apply 1 Application topically daily. (Patient not taking: Reported on 06/01/2024)     tamsulosin (FLOMAX) 0.4 MG CAPS capsule Take 0.4 mg by mouth daily. (Patient not taking: Reported on 06/01/2024)     zolpidem (AMBIEN) 5 MG tablet Take 5 mg by mouth at bedtime as needed. (Patient not taking: Reported on 06/01/2024)     No current facility-administered medications for this visit.    Allergies as of 06/01/2024 - Review Complete 06/01/2024  Allergen Reaction Noted   Penicillins  10/25/2011   Sulfa antibiotics Other (See Comments) 10/25/2011    Family History  Problem Relation Age of Onset   Colon polyps Neg Hx    Colon cancer Neg Hx    Esophageal cancer Neg Hx    Prostate cancer Neg Hx    Stomach cancer Neg Hx    Rectal cancer Neg Hx     Social History   Socioeconomic History   Marital status: Single    Spouse name: Not on file   Number of children: Not on file   Years of education: Not on file   Highest education level: Not on file  Occupational History   Occupation: holiday representative  Tobacco Use   Smoking status: Some Days    Current packs/day: 0.50     Average packs/day: 0.5 packs/day for 30.0 years (15.0 ttl pk-yrs)    Types: Cigarettes   Smokeless tobacco: Never  Vaping Use   Vaping status: Never Used  Substance and Sexual Activity   Alcohol use: Not Currently    Alcohol/week: 0.0 - 2.0 standard drinks of alcohol   Drug use: No   Sexual activity: Not Currently  Other Topics Concern   Not on file  Social History Narrative   Is concerned about a significant mold problem in his friend's home where he is living.    Social Drivers of Health  Financial Resource Strain: Medium Risk (01/21/2022)   Received from [facility]   Overall Financial Resource Strain (CARDIA)    Difficulty of Paying Living Expenses: Somewhat hard  Food Insecurity: Low Risk (05/19/2024)   Received from [facility]   Hunger Vital Sign    Within the past 12 months, you worried that your food would run out before you got money to buy more: Never true    Within the past 12 months, the food you bought just didn't last and you didn't have money to get more. : Never true  Transportation Needs: No Transportation Needs (05/19/2024)   Received from [facility]    In the past 12 months, has lack of reliable transportation kept you from medical appointments, meetings, work or from getting things needed for daily living? : No  Physical Activity: Not on file  Stress: Not on file  Social Connections: Not on file  Intimate Partner Violence: Not on file    Review of Systems:    Constitutional: No weight loss, fever, chills, weakness or fatigue HEENT: Eyes: No change in vision               Ears, Nose, Throat:  No change in hearing or congestion Skin: No rash or itching Cardiovascular: No chest pain, chest pressure or palpitations   Respiratory: No SOB or cough Gastrointestinal: See HPI and otherwise negative Genitourinary: No dysuria or change in urinary frequency Neurological: No headache, dizziness or syncope Musculoskeletal: No new muscle or  joint pain Hematologic: No bleeding or bruising Psychiatric: No history of depression or anxiety    Physical Exam:  Vital signs: BP 132/80   Pulse 84   Ht 5' 11 (1.803 m)   Wt 143 lb (64.9 kg)   BMI 19.94 kg/m   Constitutional: NAD, alert and cooperative. Appears older than stated age. Head:  Normocephalic and atraumatic. Eyes:   PEERL, EOMI. No icterus. Conjunctiva pink. Respiratory: Respirations even and unlabored. Lungs clear to auscultation bilaterally.   No wheezes, crackles, or rhonchi.  Cardiovascular:  Regular rate and rhythm. No peripheral edema, cyanosis or pallor.  Gastrointestinal:  Soft, nondistended, nontender. No rebound or guarding. Normal bowel sounds. No appreciable masses or hepatomegaly. Rectal:  Declines Msk:  Symmetrical without gross deformities. Without edema, no deformity or joint abnormality.  Neurologic:  Alert and  oriented x4;  grossly normal neurologically.  Skin:   Dry and intact without significant lesions or rashes. Psychiatric: Oriented to person, place and time. Demonstrates good judgement and reason without abnormal affect or behaviors.  Physical Exam    RELEVANT LABS AND IMAGING: CBC    Component Value Date/Time   WBC 4.1 01/02/2022 0953   RBC 4.36 01/02/2022 0953   HGB 15.3 01/02/2022 0953   HCT 42.4 01/02/2022 0953   PLT 100 (L) 01/02/2022 0953   MCV 97.2 01/02/2022 0953   MCH 35.1 (H) 01/02/2022 0953   MCHC 36.1 (H) 01/02/2022 0953   RDW 12.2 01/02/2022 0953   LYMPHSABS 1.6 01/02/2022 0953   MONOABS 0.5 01/02/2022 0953   EOSABS 0.2 01/02/2022 0953   BASOSABS 0.1 01/02/2022 0953    CMP     Component Value Date/Time   NA 142 01/02/2022 0953   K 4.1 01/02/2022 0953   CL 111 01/02/2022 0953   CO2 24 01/02/2022 0953   GLUCOSE 157 (H) 01/02/2022 0953   BUN 25 (H) 01/02/2022 0953   CREATININE 1.25 (H) 01/02/2022 0953   CALCIUM 9.7 01/02/2022 0953  PROT 7.9 01/02/2022 0953   ALBUMIN 3.8 01/02/2022 0953   AST 95 (H)  01/02/2022 0953   ALT 91 (H) 01/02/2022 0953   ALKPHOS 103 01/02/2022 0953   BILITOT 1.2 01/02/2022 0953   GFRNONAA >60 01/02/2022 0953   GFRAA >90 01/30/2012 1120     Assessment/Plan:   Diarrhea Intermittent loose stools with solid stools and occasional watery stools over the past few months after increasing his protein to 80 g/day.  Likely not getting enough fiber resulting in his change in bowel habits.  Up-to-date on colonoscopy. - Benefiber 1-2 times daily - Increase water, increase fiber, increase exercise - If no improvement can consider pursuing colonoscopy early -- patient declined follow up and stated he will make an appt if he still has issues  Compensated cirrhosis secondary to hepatitis C and alcohol use complicated by hepatocellular carcinoma follows with Steph[facility] ([facility]).  S/p treatment with Epclusa.  S/p CT-guided microwave ablation 12/2021.  Continue to consume beer daily. Recent MRI with lesion characterized as LI-RADS 4, unchanged ablation site, cirrhosis and splenomegaly - Continue to follow with Dawn Drazek for cirrhosis management - Scheduled for labs tomorrow - Continue scheduled MRI abdomen for [facility] surveillance January 2026 -- educated patient on importance of alcohol cessation  Esophageal varices Was recommended to schedule upper endoscopy in 2022 to rule out esophageal varices but appears this was not pursued secondary to cost.  He is on carvedilol but has not been taking it recently.  Recent abdominal imaging does show splenomegaly indicating some degree of portal hypertension - Will discuss with Dr. Abran if he feels EGD is warranted even though patient has been on carvedilol up until 2 weeks ago.  If no EGD indicated, would likely recommend continue carvedilol.    History of GERD with esophagitis and peptic stricture complicated by food impaction requiring emergent endoscopic intervention 2013  Continue PPI  History of colon  polyps Colonoscopy 03/2022 with 5 tubular adenomas and 1 SSP and recall 3 years - Repeat 03/2025  Nestor Blower, PA-C [facility] 06/01/2024, 3:32 PM  Cc: Stephanie Charlene CROME, MD

## 2024-06-01 NOTE — Patient Instructions (Addendum)
 VISIT SUMMARY:  Today, we discussed your ongoing bowel issues and cirrhosis management. You reported alternating between watery and solid stools, and we talked about your diet and medication adherence. We also reviewed your history of cirrhosis and the importance of managing your condition to prevent complications.  YOUR PLAN:  -FUNCTIONAL DIARRHEA: Functional diarrhea means having frequent, loose stools without an underlying infection or inflammation. To help regulate your bowel movements, start taking Benefiber 1-2 times daily.  -CIRRHOSIS OF LIVER WITH PORTAL HYPERTENSION AND SPLENOMEGALY: Cirrhosis is severe liver scarring that can lead to complications like portal hypertension (high blood pressure in the liver) and splenomegaly (enlarged spleen). To manage this, resume taking carvedilol as prescribed to control portal hypertension. We will also discuss with Dr. Abran about the need for an endoscopy to check for varices (enlarged veins) and ensure your insurance covers it. You have an MRI scheduled in January to monitor your liver condition.  INSTRUCTIONS:  Please start taking Benefiber 1-2 times daily for your bowel regularity. Resume taking carvedilol as prescribed to manage your portal hypertension. We will discuss with Dr. Abran about the necessity and insurance coverage for an endoscopy to evaluate for varices. Remember, you have an MRI scheduled in January to monitor your liver condition.  PLEASE FOLLOW UP AS NEEDED!  _______________________________________________________  If your blood pressure at your visit was 140/90 or greater, please contact your primary care physician to follow up on this.  _______________________________________________________  If you are age 68 or older, your body mass index should be between 23-30. Your Body mass index is 19.94 kg/m. If this is out of the aforementioned range listed, please consider follow up with your Primary Care Provider.  If you are  age 78 or younger, your body mass index should be between 19-25. Your Body mass index is 19.94 kg/m. If this is out of the aformentioned range listed, please consider follow up with your Primary Care Provider.   ________________________________________________________  The [facility] providers would like to encourage you to use MYCHART to communicate with providers for non-urgent requests or questions.  Due to long hold times on the telephone, sending your provider a message by [facility] may be a faster and more efficient way to get a response.  Please allow 48 business hours for a response.  Please remember that this is for non-urgent requests.  _______________________________________________________  [facility] is using a team-based approach to care.  Your team is made up of your doctor and two to three APPS. Our APPS (Nurse Practitioners and Physician Assistants) work with your physician to ensure care continuity for you. They are fully qualified to address your health concerns and develop a treatment plan. They communicate directly with your gastroenterologist to care for you. Seeing the Advanced Practice Practitioners on your physician's team can help you by facilitating care more promptly, often allowing for earlier appointments, access to diagnostic testing, procedures, and other specialty referrals.   Thank you for trusting me with your gastrointestinal care!   Nestor Blower, PA

## 2024-06-22 ENCOUNTER — Encounter: Payer: Self-pay | Admitting: Nurse Practitioner

## 2024-06-25 ENCOUNTER — Other Ambulatory Visit
